# Patient Record
Sex: Female | Born: 1940 | Race: White | Hispanic: No | Smoking: Former smoker
Health system: Southern US, Community
[De-identification: ages and names within clinical notes are randomized; demographics above are authoritative.]

## PROBLEM LIST (undated history)

## (undated) DIAGNOSIS — J45909 Unspecified asthma, uncomplicated: Secondary | ICD-10-CM

## (undated) DIAGNOSIS — I219 Acute myocardial infarction, unspecified: Secondary | ICD-10-CM

## (undated) DIAGNOSIS — K259 Gastric ulcer, unspecified as acute or chronic, without hemorrhage or perforation: Secondary | ICD-10-CM

## (undated) DIAGNOSIS — J449 Chronic obstructive pulmonary disease, unspecified: Secondary | ICD-10-CM

## (undated) DIAGNOSIS — F329 Major depressive disorder, single episode, unspecified: Secondary | ICD-10-CM

## (undated) DIAGNOSIS — I10 Essential (primary) hypertension: Secondary | ICD-10-CM

## (undated) DIAGNOSIS — Z86718 Personal history of other venous thrombosis and embolism: Secondary | ICD-10-CM

## (undated) DIAGNOSIS — Z9889 Other specified postprocedural states: Secondary | ICD-10-CM

## (undated) DIAGNOSIS — F419 Anxiety disorder, unspecified: Secondary | ICD-10-CM

## (undated) DIAGNOSIS — F32A Depression, unspecified: Secondary | ICD-10-CM

## (undated) DIAGNOSIS — I639 Cerebral infarction, unspecified: Secondary | ICD-10-CM

## (undated) DIAGNOSIS — K219 Gastro-esophageal reflux disease without esophagitis: Secondary | ICD-10-CM

## (undated) DIAGNOSIS — R011 Cardiac murmur, unspecified: Secondary | ICD-10-CM

## (undated) DIAGNOSIS — R21 Rash and other nonspecific skin eruption: Secondary | ICD-10-CM

## (undated) DIAGNOSIS — N289 Disorder of kidney and ureter, unspecified: Secondary | ICD-10-CM

## (undated) DIAGNOSIS — R0602 Shortness of breath: Secondary | ICD-10-CM

## (undated) DIAGNOSIS — Z8619 Personal history of other infectious and parasitic diseases: Secondary | ICD-10-CM

## (undated) DIAGNOSIS — R112 Nausea with vomiting, unspecified: Secondary | ICD-10-CM

## (undated) DIAGNOSIS — M109 Gout, unspecified: Secondary | ICD-10-CM

## (undated) DIAGNOSIS — R233 Spontaneous ecchymoses: Secondary | ICD-10-CM

## (undated) DIAGNOSIS — R238 Other skin changes: Secondary | ICD-10-CM

## (undated) DIAGNOSIS — E7800 Pure hypercholesterolemia, unspecified: Secondary | ICD-10-CM

## (undated) DIAGNOSIS — R1310 Dysphagia, unspecified: Secondary | ICD-10-CM

## (undated) HISTORY — PX: COLONOSCOPY W/ POLYPECTOMY: SHX1380

## (undated) HISTORY — PX: ESOPHAGUS SURGERY: SHX626

## (undated) HISTORY — PX: CATARACT EXTRACTION, BILATERAL: SHX1313

## (undated) HISTORY — PX: UPPER GI ENDOSCOPY: SHX6162

## (undated) HISTORY — PX: OTHER SURGICAL HISTORY: SHX169

## (undated) HISTORY — PX: APPENDECTOMY: SHX54

## (undated) HISTORY — PX: BREAST SURGERY: SHX581

## (undated) HISTORY — PX: ABDOMINAL HYSTERECTOMY: SHX81

---

## 2013-03-22 ENCOUNTER — Inpatient Hospital Stay (HOSPITAL_COMMUNITY)
Admission: EM | Admit: 2013-03-22 | Discharge: 2013-03-25 | DRG: 191 | Disposition: A | Discharge status: Home / Self Care | Payer: Medicare Other | Attending: Internal Medicine | Admitting: Internal Medicine

## 2013-03-22 ENCOUNTER — Other Ambulatory Visit: Payer: Self-pay

## 2013-03-22 ENCOUNTER — Emergency Department (HOSPITAL_COMMUNITY): Payer: Medicare Other

## 2013-03-22 ENCOUNTER — Encounter (HOSPITAL_COMMUNITY): Payer: Self-pay | Admitting: Emergency Medicine

## 2013-03-22 DIAGNOSIS — J441 Chronic obstructive pulmonary disease with (acute) exacerbation: Principal | ICD-10-CM | POA: Diagnosis present

## 2013-03-22 DIAGNOSIS — Z86711 Personal history of pulmonary embolism: Secondary | ICD-10-CM

## 2013-03-22 DIAGNOSIS — Z7901 Long term (current) use of anticoagulants: Secondary | ICD-10-CM

## 2013-03-22 DIAGNOSIS — N189 Chronic kidney disease, unspecified: Secondary | ICD-10-CM | POA: Diagnosis present

## 2013-03-22 DIAGNOSIS — I252 Old myocardial infarction: Secondary | ICD-10-CM

## 2013-03-22 DIAGNOSIS — I129 Hypertensive chronic kidney disease with stage 1 through stage 4 chronic kidney disease, or unspecified chronic kidney disease: Secondary | ICD-10-CM | POA: Diagnosis present

## 2013-03-22 DIAGNOSIS — Z86718 Personal history of other venous thrombosis and embolism: Secondary | ICD-10-CM

## 2013-03-22 DIAGNOSIS — N19 Unspecified kidney failure: Secondary | ICD-10-CM | POA: Diagnosis present

## 2013-03-22 DIAGNOSIS — Z87891 Personal history of nicotine dependence: Secondary | ICD-10-CM

## 2013-03-22 DIAGNOSIS — R0902 Hypoxemia: Secondary | ICD-10-CM

## 2013-03-22 DIAGNOSIS — Z79899 Other long term (current) drug therapy: Secondary | ICD-10-CM

## 2013-03-22 DIAGNOSIS — I2699 Other pulmonary embolism without acute cor pulmonale: Secondary | ICD-10-CM

## 2013-03-22 DIAGNOSIS — Z8673 Personal history of transient ischemic attack (TIA), and cerebral infarction without residual deficits: Secondary | ICD-10-CM

## 2013-03-22 DIAGNOSIS — E7800 Pure hypercholesterolemia, unspecified: Secondary | ICD-10-CM | POA: Diagnosis present

## 2013-03-22 DIAGNOSIS — N179 Acute kidney failure, unspecified: Secondary | ICD-10-CM

## 2013-03-22 HISTORY — DX: Essential (primary) hypertension: I10

## 2013-03-22 HISTORY — DX: Personal history of other venous thrombosis and embolism: Z86.718

## 2013-03-22 HISTORY — DX: Acute myocardial infarction, unspecified: I21.9

## 2013-03-22 HISTORY — DX: Cerebral infarction, unspecified: I63.9

## 2013-03-22 HISTORY — DX: Pure hypercholesterolemia, unspecified: E78.00

## 2013-03-22 HISTORY — DX: Disorder of kidney and ureter, unspecified: N28.9

## 2013-03-22 LAB — CBC WITH DIFFERENTIAL/PLATELET
Basophils Absolute: 0 10*3/uL (ref 0.0–0.1)
Basophils Relative: 0 % (ref 0–1)
HCT: 36.4 % (ref 36.0–46.0)
MCHC: 34.9 g/dL (ref 30.0–36.0)
Monocytes Absolute: 0.3 10*3/uL (ref 0.1–1.0)
Neutro Abs: 6.3 10*3/uL (ref 1.7–7.7)
Platelets: 236 10*3/uL (ref 150–400)
RDW: 14.4 % (ref 11.5–15.5)

## 2013-03-22 LAB — PROTIME-INR: INR: 1.54 — ABNORMAL HIGH (ref 0.00–1.49)

## 2013-03-22 LAB — POCT I-STAT TROPONIN I: Troponin i, poc: 0 ng/mL (ref 0.00–0.08)

## 2013-03-22 LAB — PRO B NATRIURETIC PEPTIDE: Pro B Natriuretic peptide (BNP): 481.7 pg/mL — ABNORMAL HIGH (ref 0–125)

## 2013-03-22 LAB — COMPREHENSIVE METABOLIC PANEL
AST: 29 U/L (ref 0–37)
Albumin: 3.8 g/dL (ref 3.5–5.2)
Calcium: 9.6 mg/dL (ref 8.4–10.5)
Chloride: 105 mEq/L (ref 96–112)
Creatinine, Ser: 1.46 mg/dL — ABNORMAL HIGH (ref 0.50–1.10)
Sodium: 143 mEq/L (ref 135–145)
Total Bilirubin: 0.4 mg/dL (ref 0.3–1.2)

## 2013-03-22 LAB — CG4 I-STAT (LACTIC ACID): Lactic Acid, Venous: 1.57 mmol/L (ref 0.5–2.2)

## 2013-03-22 LAB — PHOSPHORUS: Phosphorus: 2.6 mg/dL (ref 2.3–4.6)

## 2013-03-22 MED ORDER — LORAZEPAM 1 MG PO TABS: 1.0000 mg | ORAL_TABLET | Freq: Once | ORAL | Status: DC

## 2013-03-22 MED ORDER — TRAMADOL HCL 50 MG PO TABS
50.0000 mg | ORAL_TABLET | Freq: Four times a day (QID) | ORAL | Status: DC | PRN
  Filled 2013-03-22: qty 1

## 2013-03-22 MED ORDER — SODIUM CHLORIDE 0.9 % IJ SOLN: 3.0000 mL | INTRAMUSCULAR | Status: DC | PRN

## 2013-03-22 MED ORDER — ALBUTEROL SULFATE (5 MG/ML) 0.5% IN NEBU
10.0000 mg | INHALATION_SOLUTION | Freq: Once | RESPIRATORY_TRACT | Status: AC
  Administered 2013-03-22: 10 mg via RESPIRATORY_TRACT

## 2013-03-22 MED ORDER — CLONIDINE HCL 0.3 MG PO TABS
0.3000 mg | ORAL_TABLET | Freq: Every evening | ORAL | Status: DC
  Administered 2013-03-22 – 2013-03-24 (×3): 0.3 mg via ORAL
  Filled 2013-03-22 (×4): qty 1

## 2013-03-22 MED ORDER — CHOLECALCIFEROL 10 MCG (400 UNIT) PO TABS
800.0000 [IU] | ORAL_TABLET | Freq: Two times a day (BID) | ORAL | Status: DC
  Administered 2013-03-22 – 2013-03-25 (×6): 800 [IU] via ORAL
  Filled 2013-03-22 (×8): qty 2

## 2013-03-22 MED ORDER — SODIUM CHLORIDE 0.9 % IV SOLN: 250.0000 mL | INTRAVENOUS | Status: DC | PRN

## 2013-03-22 MED ORDER — SODIUM CHLORIDE 0.9 % IJ SOLN
3.0000 mL | Freq: Two times a day (BID) | INTRAMUSCULAR | Status: DC
  Administered 2013-03-22 – 2013-03-25 (×6): 3 mL via INTRAVENOUS

## 2013-03-22 MED ORDER — METHYLPREDNISOLONE SODIUM SUCC 40 MG IJ SOLR
40.0000 mg | Freq: Three times a day (TID) | INTRAMUSCULAR | Status: DC
  Administered 2013-03-23 – 2013-03-24 (×4): 40 mg via INTRAVENOUS
  Filled 2013-03-22 (×8): qty 1

## 2013-03-22 MED ORDER — ALBUTEROL (5 MG/ML) CONTINUOUS INHALATION SOLN
INHALATION_SOLUTION | RESPIRATORY_TRACT | Status: AC
  Filled 2013-03-22: qty 20

## 2013-03-22 MED ORDER — IPRATROPIUM BROMIDE 0.02 % IN SOLN
500.0000 ug | Freq: Four times a day (QID) | RESPIRATORY_TRACT | Status: DC | PRN
  Administered 2013-03-23: 500 ug via RESPIRATORY_TRACT

## 2013-03-22 MED ORDER — NEBIVOLOL HCL 20 MG PO TABS: 20.0000 mg | ORAL_TABLET | Freq: Every day | ORAL | Status: DC

## 2013-03-22 MED ORDER — MOMETASONE FURO-FORMOTEROL FUM 100-5 MCG/ACT IN AERO
2.0000 | INHALATION_SPRAY | Freq: Two times a day (BID) | RESPIRATORY_TRACT | Status: DC
  Administered 2013-03-22 – 2013-03-25 (×5): 2 via RESPIRATORY_TRACT
  Filled 2013-03-22: qty 8.8

## 2013-03-22 MED ORDER — NEBIVOLOL HCL 10 MG PO TABS
20.0000 mg | ORAL_TABLET | Freq: Every day | ORAL | Status: DC
  Administered 2013-03-22 – 2013-03-25 (×4): 20 mg via ORAL
  Filled 2013-03-22 (×4): qty 2

## 2013-03-22 MED ORDER — AMLODIPINE BESYLATE 10 MG PO TABS
10.0000 mg | ORAL_TABLET | Freq: Every day | ORAL | Status: DC
  Administered 2013-03-23 – 2013-03-25 (×3): 10 mg via ORAL
  Filled 2013-03-22 (×3): qty 1

## 2013-03-22 MED ORDER — IPRATROPIUM BROMIDE 0.02 % IN SOLN
0.5000 mg | Freq: Once | RESPIRATORY_TRACT | Status: AC
  Administered 2013-03-22: 0.5 mg via RESPIRATORY_TRACT
  Filled 2013-03-22: qty 2.5

## 2013-03-22 MED ORDER — ALBUTEROL SULFATE (5 MG/ML) 0.5% IN NEBU
5.0000 mg | INHALATION_SOLUTION | Freq: Once | RESPIRATORY_TRACT | Status: AC
  Administered 2013-03-22: 5 mg via RESPIRATORY_TRACT
  Filled 2013-03-22: qty 1

## 2013-03-22 MED ORDER — ACETAMINOPHEN 650 MG RE SUPP: 650.0000 mg | Freq: Four times a day (QID) | RECTAL | Status: DC | PRN

## 2013-03-22 MED ORDER — WARFARIN SODIUM 4 MG PO TABS
4.0000 mg | ORAL_TABLET | Freq: Once | ORAL | Status: AC
  Administered 2013-03-22: 4 mg via ORAL
  Filled 2013-03-22 (×2): qty 1

## 2013-03-22 MED ORDER — OMEGA-3-ACID ETHYL ESTERS 1 G PO CAPS
1.0000 g | ORAL_CAPSULE | Freq: Two times a day (BID) | ORAL | Status: DC
  Administered 2013-03-22 – 2013-03-25 (×6): 1 g via ORAL
  Filled 2013-03-22 (×7): qty 1

## 2013-03-22 MED ORDER — SODIUM CHLORIDE 0.9 % IV BOLUS (SEPSIS)
1000.0000 mL | Freq: Once | INTRAVENOUS | Status: AC
  Administered 2013-03-22: 1000 mL via INTRAVENOUS

## 2013-03-22 MED ORDER — WARFARIN - PHARMACIST DOSING INPATIENT: Freq: Every day | Status: DC

## 2013-03-22 MED ORDER — NITROGLYCERIN 0.4 MG SL SUBL: 0.4000 mg | SUBLINGUAL_TABLET | SUBLINGUAL | Status: DC | PRN

## 2013-03-22 MED ORDER — ACETAMINOPHEN 325 MG PO TABS: 650.0000 mg | ORAL_TABLET | Freq: Four times a day (QID) | ORAL | Status: DC | PRN

## 2013-03-22 MED ORDER — LEVALBUTEROL HCL 0.63 MG/3ML IN NEBU
0.6300 mg | INHALATION_SOLUTION | Freq: Four times a day (QID) | RESPIRATORY_TRACT | Status: DC | PRN
  Administered 2013-03-23 – 2013-03-24 (×6): 0.63 mg via RESPIRATORY_TRACT
  Filled 2013-03-22 (×7): qty 3

## 2013-03-22 MED ORDER — LORAZEPAM 2 MG/ML IJ SOLN
1.0000 mg | Freq: Once | INTRAMUSCULAR | Status: AC
  Administered 2013-03-22: 1 mg via INTRAVENOUS
  Filled 2013-03-22: qty 1

## 2013-03-22 MED ORDER — SODIUM CHLORIDE 0.9 % IJ SOLN
3.0000 mL | Freq: Two times a day (BID) | INTRAMUSCULAR | Status: DC
  Administered 2013-03-24: 3 mL via INTRAVENOUS

## 2013-03-22 MED ORDER — LEVOFLOXACIN IN D5W 750 MG/150ML IV SOLN
750.0000 mg | INTRAVENOUS | Status: DC
  Administered 2013-03-22 – 2013-03-24 (×2): 750 mg via INTRAVENOUS
  Filled 2013-03-22 (×2): qty 150

## 2013-03-22 MED ORDER — PANTOPRAZOLE SODIUM 40 MG PO TBEC
40.0000 mg | DELAYED_RELEASE_TABLET | Freq: Every day | ORAL | Status: DC
  Administered 2013-03-22 – 2013-03-25 (×4): 40 mg via ORAL
  Filled 2013-03-22 (×4): qty 1

## 2013-03-22 MED ORDER — ISOSORBIDE MONONITRATE ER 60 MG PO TB24
60.0000 mg | ORAL_TABLET | Freq: Every day | ORAL | Status: DC
  Administered 2013-03-23 – 2013-03-25 (×3): 60 mg via ORAL
  Filled 2013-03-22 (×3): qty 1

## 2013-03-22 MED ORDER — PREDNISONE 20 MG PO TABS
60.0000 mg | ORAL_TABLET | Freq: Once | ORAL | Status: AC
  Administered 2013-03-22: 60 mg via ORAL
  Filled 2013-03-22: qty 3

## 2013-03-22 MED ORDER — ATORVASTATIN CALCIUM 40 MG PO TABS
40.0000 mg | ORAL_TABLET | Freq: Every day | ORAL | Status: DC
  Administered 2013-03-22 – 2013-03-24 (×3): 40 mg via ORAL
  Filled 2013-03-22 (×4): qty 1

## 2013-03-22 NOTE — Progress Notes (Signed)
ANTIBIOTIC CONSULT NOTE - INITIAL  Pharmacy Consult for Warfarin and Levofloxacin Indication: rule out pneumonia  No Known Allergies  Patient Measurements: Height: 5\' 4"  (162.6 cm) Weight: 207 lb (93.895 kg) IBW/kg (Calculated) : 54.7  Vital Signs: Temp: 98.1 F (36.7 C) (04/19 1330) Temp src: Oral (04/19 1330) BP: 187/75 mmHg (04/19 1600) Pulse Rate: 122 (04/19 1724) Intake/Output from previous day:   Intake/Output from this shift:    Labs:  Recent Labs  03/22/13 1347  WBC 8.6  HGB 12.7  PLT 236  CREATININE 1.46*   Estimated Creatinine Clearance: 39.8 ml/min (by C-G formula based on Cr of 1.46). No results found for this basename: VANCOTROUGH, VANCOPEAK, VANCORANDOM, GENTTROUGH, GENTPEAK, GENTRANDOM, TOBRATROUGH, TOBRAPEAK, TOBRARND, AMIKACINPEAK, AMIKACINTROU, AMIKACIN,  in the last 72 hours   Microbiology: No results found for this or any previous visit (from the past 720 hour(s)).  Medical History: Past Medical History  Diagnosis Date  . Stroke   . Hypertension   . MI (myocardial infarction)   . History of blood clots   . Renal disorder   . High cholesterol     Medications:   (Not in a hospital admission)  Assessment: Admit Complaint: 72 y.o.  female  admitted 03/22/2013 with SOB.  Pharmacy consulted to dose Levofloxacin for COPD exacerbation and warfarin for history of PE/DVT.Marland Kitchen  PMH: Stroke, HTN, CAD, COPD, History of PE,  PTA Warfarin 2.5 mg daily  Assessment: Anticoagulation: History of PE: warfarin, INR <goal  Infectious Disease: COPD exacerbation Antibiotics: 4/19 Levofloxacin  PTA Medication Issues: Home Meds Not Ordered:   Best Practices: VTE Prophylaxis:  warfarin   Goal of Therapy:  Renal adjustment of antibiotics. INR 2-3  Plan:  1. Levofloxacin 750 mg IV q48h 2. Warfarin 4 mg PO x 1 today 3. Daily INR 4. Follow up SCr, UOP, cultures, clinical course and adjust as clinically indicated.   Thank you for allowing pharmacy to  be a part of this patients care team.  Lovenia Kim Pharm.D., BCPS Clinical Pharmacist 03/22/2013 5:28 PM Pager: [phone] Phone: [phone]

## 2013-03-22 NOTE — ED Provider Notes (Signed)
History     CSN: [id number]  Arrival date & time 03/22/13  1309   First MD Initiated Contact with Patient 03/22/13 1330      Chief Complaint  Patient presents with  . Shortness of Breath    (Consider location/radiation/quality/duration/timing/severity/associated sxs/prior treatment) HPI 72 year old female is gradual onset gradually worsening few days of cough and shortness of breath and wheezing with no fever no confusion no chest pain no bowel pain no vomiting or diarrhea. She is generally weak and today went to an urgent care where she was found to be hypoxic with room-air pulse oximetry 88% and sent to the ED for evaluation after she received albuterol and Atrovent nebulizer at the urgent care. She is moderately short of breath at rest today. Past Medical History  Diagnosis Date  . Stroke   . Hypertension   . MI (myocardial infarction)   . History of blood clots   . Renal disorder   . High cholesterol    DVT PE, renal insufficiency, chronic edema, COPD  Past Surgical History  Procedure Laterality Date  . Abdominal hysterectomy      History reviewed. No pertinent family history.  History  Substance Use Topics  . Smoking status: Former Smoker    Quit date: 02/01/2013  . Smokeless tobacco: Not on file  . Alcohol Use: No    OB History   Grav Para Term Preterm Abortions TAB SAB Ect Mult Living                  Review of Systems 10 Systems reviewed and are negative for acute change except as noted in the HPI. Allergies  Review of patient's allergies indicates no known allergies.  Home Medications   Current Outpatient Rx  Name  Route  Sig  Dispense  Refill  . albuterol (PROVENTIL) (2.5 MG/3ML) 0.083% nebulizer solution   Nebulization   Take 2.5 mg by nebulization every 4 (four) hours as needed for wheezing or shortness of breath.         Marland Kitchen amLODipine (NORVASC) 10 MG tablet   Oral   Take 10 mg by mouth daily.         Marland Kitchen atorvastatin (LIPITOR) 40 MG  tablet   Oral   Take 40 mg by mouth at bedtime.         . Calcium-Magnesium-Vitamin D (CALCIUM 500 PO)   Oral   Take 500 mg by mouth daily.         . cholecalciferol (VITAMIN D) 400 UNITS TABS   Oral   Take 800 Units by mouth 2 (two) times daily.         . cloNIDine (CATAPRES) 0.3 MG tablet   Oral   Take 0.3 mg by mouth every evening.          . Fluticasone-Salmeterol (ADVAIR) 250-50 MCG/DOSE AEPB   Inhalation   Inhale 1 puff into the lungs every 12 (twelve) hours.         . furosemide (LASIX) 20 MG tablet   Oral   Take 20 mg by mouth daily.         Marland Kitchen guaiFENesin (MUCINEX) 600 MG 12 hr tablet   Oral   Take 1,200 mg by mouth every 12 (twelve) hours as needed for congestion.         Marland Kitchen ipratropium (ATROVENT) 0.02 % nebulizer solution   Nebulization   Take 500 mcg by nebulization every 6 (six) hours as needed for wheezing ((with Xopenex)).         Marland Kitchen  isosorbide mononitrate (IMDUR) 60 MG 24 hr tablet   Oral   Take 60 mg by mouth daily.         Marland Kitchen lisinopril (PRINIVIL,ZESTRIL) 20 MG tablet   Oral   Take 20 mg by mouth daily.         . Nebivolol HCl (BYSTOLIC) 20 MG TABS   Oral   Take 20 mg by mouth daily.         . nitroGLYCERIN (NITROSTAT) 0.4 MG SL tablet   Sublingual   Place 0.4 mg under the tongue every 5 (five) minutes as needed for chest pain.         . Omega-3 Fatty Acids (FISH OIL) 1200 MG CAPS   Oral   Take 1,200 mg by mouth daily.         Marland Kitchen omeprazole (PRILOSEC) 20 MG capsule   Oral   Take 20 mg by mouth daily.         . traMADol (ULTRAM) 50 MG tablet   Oral   Take 50 mg by mouth every 6 (six) hours as needed for pain.         Marland Kitchen tretinoin (RETIN-A) 0.1 % cream   Topical   Apply 1 application topically as directed.         . vitamin E 1000 UNIT capsule   Oral   Take 1,000 Units by mouth daily.         Marland Kitchen HYDROcodone-homatropine (HYCODAN) 5-1.5 MG/5ML syrup   Oral   Take 5 mLs by mouth every 6 (six) hours as needed  for cough.   120 mL   0   . levofloxacin (LEVAQUIN) 750 MG tablet   Oral   Take 1 tablet (750 mg total) by mouth every other day.   2 tablet   0   . predniSONE (DELTASONE) 20 MG tablet      40 mg x 3 days, 30 mg x 3 days, 20 mg x 3 days, 10 mg x 3 days then d/c   30 tablet   0   . warfarin (COUMADIN) 4 MG tablet   Oral   Take 1 tablet (4 mg total) by mouth daily.   10 tablet   0     BP 158/74  Pulse 80  Temp(Src) 97.7 F (36.5 C) (Oral)  Resp 18  Ht 5\' 4"  (1.626 m)  Wt 212 lb 3.2 oz (96.253 kg)  BMI 36.41 kg/m2  SpO2 95%  Physical Exam  Nursing note and vitals reviewed. Constitutional:  Awake, alert, nontoxic appearance.  HENT:  Head: Atraumatic.  Eyes: Right eye exhibits no discharge. Left eye exhibits no discharge.  Neck: Neck supple.  Cardiovascular: Normal rate and regular rhythm.   No murmur heard. Pulmonary/Chest: She is in respiratory distress. She has wheezes. She has no rales. She exhibits no tenderness.  Moderate respiratory distress, the patient is able to speak short sentences at rest with moderate distress, room-air pulse oximetry upon arrival was 88% hypoxic, pulse oximetry on nasal cannula oxygen is in the high 90s, she has diffuse history expiratory wheezes with mild retractions mild accessory muscle usage no crackles no rhonchi no stridor no drooling no immediate airway compromise  Abdominal: Soft. There is no tenderness. There is no rebound.  Musculoskeletal: She exhibits edema. She exhibits no tenderness.  Baseline ROM, no obvious new focal weakness. Trace edema to her lower legs which is baseline for the patient.  Neurological: She is alert.  Mental status and motor strength appears  baseline for patient and situation.  Skin: No rash noted.  Psychiatric: She has a normal mood and affect.    ED Course  Procedures (including critical care time) ECG: Sinus rhythm, ventricular rate 97, normal axis, normal intervals, nonspecific ST T changes  diffusely with no comparison ECG available   Patient / Family / Caregiver understand and agree with initial ED impression and plan with expectations set for ED visit.  Transiently improved but worse again after x-ray with diffuse wheezing and dyspnea at rest just like upon arrival so repeat continuous neb and paged Unassigned med for admit. 1540  Pt had initially hoped for discharge but with worsening wheezing again feel admit warranted and Pt agrees; d/w [facility] who will evaluate Pt in the ED for admit. 1550  Patient / Family / Caregiver informed of clinical course, understand medical decision-making process, and agree with plan.  Labs Reviewed  PRO B NATRIURETIC PEPTIDE - Abnormal; Notable for the following:    Pro B Natriuretic peptide (BNP) 481.7 (*)    All other components within normal limits  CBC WITH DIFFERENTIAL - Abnormal; Notable for the following:    Eosinophils Relative 8 (*)    All other components within normal limits  COMPREHENSIVE METABOLIC PANEL - Abnormal; Notable for the following:    Potassium 3.4 (*)    Glucose, Bld 135 (*)    Creatinine, Ser 1.46 (*)    GFR calc non Af Amer 35 (*)    GFR calc Af Amer 41 (*)    All other components within normal limits  PROTIME-INR - Abnormal; Notable for the following:    Prothrombin Time 18.0 (*)    INR 1.54 (*)    All other components within normal limits  PROTIME-INR - Abnormal; Notable for the following:    Prothrombin Time 17.6 (*)    All other components within normal limits  BASIC METABOLIC PANEL - Abnormal; Notable for the following:    Glucose, Bld 107 (*)    Creatinine, Ser 1.71 (*)    GFR calc non Af Amer 29 (*)    GFR calc Af Amer 34 (*)    All other components within normal limits  CBC - Abnormal; Notable for the following:    WBC 11.3 (*)    RBC 3.61 (*)    Hemoglobin 11.5 (*)    HCT 33.3 (*)    All other components within normal limits  PROTIME-INR - Abnormal; Notable for the following:     Prothrombin Time 17.9 (*)    INR 1.52 (*)    All other components within normal limits  CBC - Abnormal; Notable for the following:    WBC 17.5 (*)    RBC 3.54 (*)    Hemoglobin 11.3 (*)    HCT 32.9 (*)    All other components within normal limits  BASIC METABOLIC PANEL - Abnormal; Notable for the following:    Glucose, Bld 136 (*)    BUN 33 (*)    Creatinine, Ser 1.56 (*)    GFR calc non Af Amer 33 (*)    GFR calc Af Amer 38 (*)    All other components within normal limits  PROTIME-INR - Abnormal; Notable for the following:    Prothrombin Time 20.9 (*)    INR 1.88 (*)    All other components within normal limits  CULTURE, EXPECTORATED SPUTUM-ASSESSMENT  TSH  MAGNESIUM  PHOSPHORUS  LEGIONELLA ANTIGEN, URINE  STREP PNEUMONIAE URINARY ANTIGEN  CG4 I-STAT (LACTIC ACID)  POCT I-STAT TROPONIN I   No results found.   1. COPD with acute exacerbation   2. Hypoxia   3. History of DVT (deep vein thrombosis)   4. PE (pulmonary embolism)   5. Renal failure   6. AKI (acute kidney injury)       MDM  The patient appears reasonably stabilized for admission considering the current resources, flow, and capabilities available in the ED at this time, and I doubt any other [facility] requiring further screening and/or treatment in the ED prior to admission.        Hurman Horn, MD 03/27/13 [phone]

## 2013-03-22 NOTE — Progress Notes (Signed)
Patient transferred from ED via stretcher to room 4710. Received report from ED nurse. Patient mildly anxious but able to focus and listen when orienting patient to call bell system, TV and education about being on heart failure floor. Patietn understood. Patient has been receiving breathing treatments around the clock and therefore patient stated she felt a liittle "shaky". Explained to patient that it is mainly from the side effects of the breathing treatments at a pretty frequent dosing and she understood. Encouraged patient to try to do pursed lip breathing to help try to relax and help her to be calm. During giving report to the next nurse was notified that patient was resting. Nurse signing off and night nurse will continue to monitor to end of shift.

## 2013-03-22 NOTE — H&P (Signed)
[facility] History and Physical  [name] [name] ZOX:[id number] DOB: 1941-05-30 DOA: 03/22/2013  Referring physician: Dr. Fonnie Jarvis PCP: No primary provider on file.  Specialists: none  Chief Complaint: SOB, increased cough  HPI: [name] [name] is a 72 y.o. female  With history of COPD not on home oxygen who presented to the ED after developing SOB while at a funeral.  She reports that certain perfumes trigger her COPD and she thinks she may have got in contact with a perfume while at the funeral.  Since Wednesday she reports increase in cough which is productive of clear sputum above her baseline and SOB.  Her nebulizers at home were only minimally helping and her problem got worse and as such she decided to go to the urgent care center for further evaluation.   While in the ED received solumedrol and albuterol but given patient's wheezing we were consulted for admission evaluation and recommendations for Copd exacerbation.  Review of Systems: 10 point review of system reviewed and negative unless otherwise mentioned above.  Past Medical History  Diagnosis Date  . Stroke   . Hypertension   . MI (myocardial infarction)   . History of blood clots   . Renal disorder   . High cholesterol    Past Surgical History  Procedure Laterality Date  . Abdominal hysterectomy     Social History:  reports that she quit smoking about 7 weeks ago. She does not have any smokeless tobacco history on file. She reports that she does not drink alcohol. Her drug history is not on file. Patient lives at home  Can patient participate in ADLs? yes  No Known Allergies  History reviewed. No pertinent family history.   Prior to Admission medications   Medication Sig Start Date End Date Taking? Authorizing Provider  albuterol (PROVENTIL) (2.5 MG/3ML) 0.083% nebulizer solution Take 2.5 mg by nebulization every 4 (four) hours as needed for wheezing or shortness of breath.   Yes Historical Provider, MD   amLODipine (NORVASC) 10 MG tablet Take 10 mg by mouth daily.   Yes Historical Provider, MD  atorvastatin (LIPITOR) 40 MG tablet Take 40 mg by mouth at bedtime.   Yes Historical Provider, MD  Calcium-Magnesium-Vitamin D (CALCIUM 500 PO) Take 500 mg by mouth daily.   Yes Historical Provider, MD  cholecalciferol (VITAMIN D) 400 UNITS TABS Take 800 Units by mouth 2 (two) times daily.   Yes Historical Provider, MD  cloNIDine (CATAPRES) 0.3 MG tablet Take 0.3 mg by mouth every evening.    Yes Historical Provider, MD  Fluticasone-Salmeterol (ADVAIR) 250-50 MCG/DOSE AEPB Inhale 1 puff into the lungs every 12 (twelve) hours.   Yes Historical Provider, MD  furosemide (LASIX) 20 MG tablet Take 20 mg by mouth daily.   Yes Historical Provider, MD  guaiFENesin (MUCINEX) 600 MG 12 hr tablet Take 1,200 mg by mouth every 12 (twelve) hours as needed for congestion.   Yes Historical Provider, MD  ipratropium (ATROVENT) 0.02 % nebulizer solution Take 500 mcg by nebulization every 6 (six) hours as needed for wheezing ((with Xopenex)).   Yes Historical Provider, MD  isosorbide mononitrate (IMDUR) 60 MG 24 hr tablet Take 60 mg by mouth daily.   Yes Historical Provider, MD  lisinopril (PRINIVIL,ZESTRIL) 20 MG tablet Take 20 mg by mouth daily.   Yes Historical Provider, MD  Nebivolol HCl (BYSTOLIC) 20 MG TABS Take 20 mg by mouth daily.   Yes Historical Provider, MD  nitroGLYCERIN (NITROSTAT) 0.4 MG SL tablet Place 0.4 mg  under the tongue every 5 (five) minutes as needed for chest pain.   Yes Historical Provider, MD  Omega-3 Fatty Acids (FISH OIL) 1200 MG CAPS Take 1,200 mg by mouth daily.   Yes Historical Provider, MD  omeprazole (PRILOSEC) 20 MG capsule Take 20 mg by mouth daily.   Yes Historical Provider, MD  traMADol (ULTRAM) 50 MG tablet Take 50 mg by mouth every 6 (six) hours as needed for pain.   Yes Historical Provider, MD  tretinoin (RETIN-A) 0.1 % cream Apply 1 application topically as directed.   Yes Historical  Provider, MD  vitamin E 1000 UNIT capsule Take 1,000 Units by mouth daily.   Yes Historical Provider, MD  warfarin (COUMADIN) 2.5 MG tablet Take 2.5 mg by mouth daily.    Yes Historical Provider, MD   Physical Exam: Filed Vitals:   03/22/13 1445 03/22/13 1530 03/22/13 1545 03/22/13 1600  BP: 185/87 191/81 189/86 187/75  Pulse: 104 117 113 117  Temp:      TempSrc:      Resp: 17 24 19 18   Height:      Weight:      SpO2: 100% 97% 97% 100%     General:  Pt in NAD, Alert and Awake  Eyes: EOMI, non icteric  ENT: normal exterior appearance, MMM  Neck: supple no goiter  Cardiovascular: S1 and S2 WNL tachycardic, No MRG  Respiratory: CTA BL, no wheezes  Abdomen: soft, NT, ND  Skin: warm and dry  Musculoskeletal: no cyanosis or clubbing, no calf pain  Psychiatric:  Mood and affect appropriate  Neurologic: moves all extremities, answers questions appropriately.  Labs on Admission:  Basic Metabolic Panel:  Recent Labs Lab 03/22/13 1347  NA 143  K 3.4*  CL 105  CO2 25  GLUCOSE 135*  BUN 15  CREATININE 1.46*  CALCIUM 9.6   Liver Function Tests:  Recent Labs Lab 03/22/13 1347  AST 29  ALT 20  ALKPHOS 115  BILITOT 0.4  PROT 6.7  ALBUMIN 3.8   No results found for this basename: LIPASE, AMYLASE,  in the last 168 hours No results found for this basename: AMMONIA,  in the last 168 hours CBC:  Recent Labs Lab 03/22/13 1347  WBC 8.6  NEUTROABS 6.3  HGB 12.7  HCT 36.4  MCV 91.7  PLT 236   Cardiac Enzymes: No results found for this basename: CKTOTAL, CKMB, CKMBINDEX, TROPONINI,  in the last 168 hours  BNP (last 3 results)  Recent Labs  03/22/13 1346  PROBNP 481.7*   CBG: No results found for this basename: GLUCAP,  in the last 168 hours  Radiological Exams on Admission: Dg Chest 2 View (if Patient Has Fever And/or Copd)  03/22/2013  *RADIOLOGY REPORT*  Clinical Data: Shortness of breath  CHEST - 2 VIEW  Comparison: None.  Findings: Lungs are  clear. No pleural effusion or pneumothorax.  The heart is normal in size.  Mild degenerative changes of the visualized thoracolumbar spine.  IVC filter.  IMPRESSION: No evidence of acute cardiopulmonary disease.   Original Report Authenticated By: Charline Bills, M.D.     EKG: Independently reviewed.   Assessment/Plan Active Problems:   COPD with acute exacerbation   PE (pulmonary embolism)   Renal failure   History of DVT (deep vein thrombosis)   1. COPD with acute exacerbation -  Place on Levaquin, nebulizers, Dulera - sputum culture - most likely related to recent perfume exposure at funeral - supplemental oxygen prn  - Solumedrol  40 mg IV tid - check TSH, magnesium, phosphorus level.  2. 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 Renal failure - Will hold nephrotoxic agents lisinopril and lasix - recheck S creatinine next am   Code Status: full Family Communication: Discussed with patient and daughter at bedside.  Disposition Plan: With improvement in respiratory condition. Likely next 1-3 days.  Time spent: > 60 minutes  Penny Pia [facility] Pager [phone]  If 7PM-7AM, please contact night-coverage [email or web address] Password [facility] 03/22/2013, 4:51 PM

## 2013-03-22 NOTE — ED Notes (Addendum)
Pt from urgent care, c/o sob. Pt received duo neb,decadron, and albuterol treatment PTA. Pt states breathing is better, audible inspiratory/expritory wheezing. Pt states sob began Wed. At funeral of brother.

## 2013-03-23 DIAGNOSIS — N179 Acute kidney failure, unspecified: Secondary | ICD-10-CM

## 2013-03-23 LAB — CBC
MCH: 31.9 pg (ref 26.0–34.0)
MCV: 92.2 fL (ref 78.0–100.0)
Platelets: 215 10*3/uL (ref 150–400)
RDW: 14.6 % (ref 11.5–15.5)

## 2013-03-23 LAB — BASIC METABOLIC PANEL
BUN: 23 mg/dL (ref 6–23)
CO2: 23 mEq/L (ref 19–32)
Calcium: 9.4 mg/dL (ref 8.4–10.5)
Creatinine, Ser: 1.71 mg/dL — ABNORMAL HIGH (ref 0.50–1.10)
Glucose, Bld: 107 mg/dL — ABNORMAL HIGH (ref 70–99)

## 2013-03-23 LAB — LEGIONELLA ANTIGEN, URINE: Legionella Antigen, Urine: NEGATIVE

## 2013-03-23 MED ORDER — METHYLPREDNISOLONE SODIUM SUCC 40 MG IJ SOLR
40.0000 mg | Freq: Once | INTRAMUSCULAR | Status: AC
  Administered 2013-03-23: 40 mg via INTRAVENOUS

## 2013-03-23 MED ORDER — GUAIFENESIN-DM 100-10 MG/5ML PO SYRP
5.0000 mL | ORAL_SOLUTION | ORAL | Status: DC | PRN
  Administered 2013-03-23 (×2): 5 mL via ORAL
  Filled 2013-03-23 (×2): qty 5

## 2013-03-23 MED ORDER — SODIUM CHLORIDE 0.9 % IV SOLN
INTRAVENOUS | Status: DC
  Administered 2013-03-23 (×2): via INTRAVENOUS

## 2013-03-23 MED ORDER — WARFARIN SODIUM 5 MG PO TABS
5.0000 mg | ORAL_TABLET | Freq: Once | ORAL | Status: AC
  Administered 2013-03-23: 5 mg via ORAL
  Filled 2013-03-23 (×2): qty 1

## 2013-03-23 NOTE — Progress Notes (Signed)
ANTICOAGULATION CONSULT NOTE - Follow Up Consult  Pharmacy Consult for warfarin Indication: history of DVT/PE  No Known Allergies  Patient Measurements: Height: 5\' 4"  (162.6 cm) Weight: 161 lb 7.5 oz (93.2 kg) IBW/kg (Calculated) : 54.7   Vital Signs: Temp: 97.6 F (36.4 C) (04/20 0709) Temp src: Oral (04/20 0709) BP: 151/77 mmHg (04/20 0709) Pulse Rate: 98 (04/20 0709)  Labs:  Recent Labs  03/22/13 1347 03/23/13 0510  HGB 12.7 11.5*  HCT 36.4 33.3*  PLT 236 215  LABPROT 18.0* 17.6*  INR 1.54* 1.49  CREATININE 1.46* 1.71*    Estimated Creatinine Clearance: 33.9 ml/min (by C-G formula based on Cr of 1.71).   Assessment: 66 yoF admitted 4/19 with COPD exacerbation. Patient on chronic warfarin for history of DVT/PE (2.5mg  daily). INR was SUB-therapeutic on admission at 1.54. Patient received 4mg  last night and INR still below therapeutic range today at 1.49. Hgb has dropped to 11.5 (12.7 on admit), plts are stable. No bleeding noted.   Goal of Therapy:  INR 2-3 Monitor platelets by anticoagulation protocol: Yes   Plan:  - warfarin 5mg  PO x1 tonight - daily INR - CBC at least q72h - monitor for bleeding  Thank you for the consult.  Tomi Bamberger, PharmD Clinical Pharmacist Pager: [phone] Pharmacy: [phone] 03/23/2013 7:34 AM

## 2013-03-23 NOTE — Progress Notes (Signed)
The patient began to have labored breathing and loud, expiratory wheezes after having a coughing spell. Her O2 saturation remained in the upper 90s, but the patient became increasingly SOB when talking. When a prn Xopenex treatment did not alleviate symptoms, the TH on call and [facility] were notified.  Both came to assess the patient.  New orders were given to administer 40 mg of Solu-medrol IV and to contact both again if the patient experienced worsening symptoms.  The RN will carry out the orders and will continue to monitor the patient.

## 2013-03-23 NOTE — Progress Notes (Signed)
[facility] PROGRESS NOTE  [name] [name] ZOX:[id number] DOB: 02-14-1941 DOA: 03/22/2013 PCP: No primary provider on file.  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   Renal failure  - Will hold nephrotoxic agents lisinopril and lasix  -gentle IVF   Code Status: full Family Communication: patient at bedside Disposition Plan: home 2-3 days   Consultants:  none  Procedures:  none  Antibiotics:  levaquin  HPI/Subjective: C/o wheezing +cough, still not feeling well  Objective: Filed Vitals:   03/22/13 2052 03/23/13 0206 03/23/13 0709 03/23/13 0826  BP:  151/74 151/77   Pulse:  105 98   Temp:  97.6 F (36.4 C) 97.6 F (36.4 C)   TempSrc:  Oral Oral   Resp:  24 22   Height:      Weight:   93.2 kg (205 lb 7.5 oz)   SpO2: 95% 97% 98% 98%    Intake/Output Summary (Last 24 hours) at 03/23/13 0919 Last data filed at 03/23/13 0828  Gross per 24 hour  Intake    723 ml  Output      0 ml  Net    723 ml   Filed Weights   03/22/13 1322 03/22/13 1800 03/23/13 0709  Weight: 93.895 kg (207 lb) 92.534 kg (204 lb) 93.2 kg (205 lb 7.5 oz)    Exam:   General:  A+Ox3, NAD  Cardiovascular: rrr  Respiratory: b/l wheezing and upper airway sounds  Abdomen: +BS, soft, NT/ND  Musculoskeletal: moves all 4 ext   Data Reviewed: Basic Metabolic Panel:  Recent Labs Lab 03/22/13 1347 03/22/13 1714 03/23/13 0510  NA 143  --  142  K 3.4*  --  3.8  CL 105  --  106  CO2 25  --  23  GLUCOSE 135*  --  107*  BUN 15  --  23  CREATININE 1.46*  --  1.71*  CALCIUM 9.6  --  9.4  MG  --  1.9   --   PHOS  --  2.6  --    Liver Function Tests:  Recent Labs Lab 03/22/13 1347  AST 29  ALT 20  ALKPHOS 115  BILITOT 0.4  PROT 6.7  ALBUMIN 3.8   No results found for this basename: LIPASE, AMYLASE,  in the last 168 hours No results found for this basename: AMMONIA,  in the last 168 hours CBC:  Recent Labs Lab 03/22/13 1347 03/23/13 0510  WBC 8.6 11.3*  NEUTROABS 6.3  --   HGB 12.7 11.5*  HCT 36.4 33.3*  MCV 91.7 92.2  PLT 236 215   Cardiac Enzymes: No results found for this basename: CKTOTAL, CKMB, CKMBINDEX, TROPONINI,  in the last 168 hours BNP (last 3 results)  Recent Labs  03/22/13 1346  PROBNP 481.7*   CBG: No results found for this basename: GLUCAP,  in the last 168 hours  No results found for this or any previous visit (from the past 240 hour(s)).   Studies: Dg Chest 2 View (if Patient Has Fever And/or Copd)  03/22/2013  *RADIOLOGY REPORT*  Clinical Data: Shortness of breath  CHEST - 2 VIEW  Comparison: None.  Findings: Lungs are clear. No pleural effusion or pneumothorax.  The heart is normal in size.  Mild degenerative changes of the visualized thoracolumbar spine.  IVC filter.  IMPRESSION: No evidence of acute cardiopulmonary disease.   Original Report Authenticated By: Charline Bills, M.D.     Scheduled Meds: . amLODipine  10 mg Oral Daily  . atorvastatin  40 mg Oral QHS  . cholecalciferol  800 Units Oral BID  . cloNIDine  0.3 mg Oral QPM  . isosorbide mononitrate  60 mg Oral Daily  . levofloxacin (LEVAQUIN) IV  750 mg Intravenous Q48H  . methylPREDNISolone (SOLU-MEDROL) injection  40 mg Intravenous Q8H  . mometasone-formoterol  2 puff Inhalation BID  . nebivolol  20 mg Oral Daily  . omega-3 acid ethyl esters  1 g Oral BID  . pantoprazole  40 mg Oral Daily  . sodium chloride  3 mL Intravenous Q12H  . sodium chloride  3 mL Intravenous Q12H  . warfarin  5 mg Oral ONCE-1800  . Warfarin - Pharmacist Dosing Inpatient   Does not apply q1800    Continuous Infusions:   Active Problems:   COPD with acute exacerbation   PE (pulmonary embolism)   Renal failure   History of DVT (deep vein thrombosis)    Time spent: 35    [facility], JESSICA  [facility] Pager [phone]. If 7PM-7AM, please contact night-coverage at [email or web address], password [facility] 03/23/2013, 9:19 AM  LOS: 1 day

## 2013-03-23 NOTE — Progress Notes (Signed)
The patient's wheezes became slightly better throughout the night while awake.  During sleep, no wheezes could be heard.  Her O2 sats remained in the upper 90s all night.

## 2013-03-24 LAB — CBC
MCH: 31.9 pg (ref 26.0–34.0)
MCV: 92.9 fL (ref 78.0–100.0)
Platelets: 233 10*3/uL (ref 150–400)
RBC: 3.54 MIL/uL — ABNORMAL LOW (ref 3.87–5.11)
RDW: 14.9 % (ref 11.5–15.5)

## 2013-03-24 LAB — BASIC METABOLIC PANEL
CO2: 23 mEq/L (ref 19–32)
Calcium: 9.1 mg/dL (ref 8.4–10.5)
Creatinine, Ser: 1.56 mg/dL — ABNORMAL HIGH (ref 0.50–1.10)

## 2013-03-24 MED ORDER — WARFARIN SODIUM 6 MG PO TABS
6.0000 mg | ORAL_TABLET | Freq: Once | ORAL | Status: AC
  Administered 2013-03-24: 6 mg via ORAL
  Filled 2013-03-24: qty 1

## 2013-03-24 MED ORDER — HYDROCODONE-HOMATROPINE 5-1.5 MG/5ML PO SYRP
5.0000 mL | ORAL_SOLUTION | Freq: Four times a day (QID) | ORAL | Status: DC | PRN
  Administered 2013-03-24 – 2013-03-25 (×3): 5 mL via ORAL
  Filled 2013-03-24 (×3): qty 5

## 2013-03-24 MED ORDER — PREDNISONE 20 MG PO TABS
40.0000 mg | ORAL_TABLET | Freq: Every day | ORAL | Status: DC
  Administered 2013-03-24 – 2013-03-25 (×2): 40 mg via ORAL
  Filled 2013-03-24 (×4): qty 2

## 2013-03-24 NOTE — Progress Notes (Signed)
ANTICOAGULATION CONSULT NOTE - Follow Up Consult  Pharmacy Consult for Coumadin Indication: History of DVT/PE, chronic anticoagulation  Labs:  Recent Labs  03/22/13 1347 03/23/13 0510 03/24/13 0535  HGB 12.7 11.5* 11.3*  HCT 36.4 33.3* 32.9*  PLT 236 215 233  LABPROT 18.0* 17.6* 17.9*  INR 1.54* 1.49 1.52*  CREATININE 1.46* 1.71* 1.56*   Lab Results  Component Value Date   INR 1.52* 03/24/2013   INR 1.49 03/23/2013   INR 1.54* 03/22/2013    Medications:  Prescriptions prior to admission  Medication Sig Dispense Refill  . albuterol (PROVENTIL) (2.5 MG/3ML) 0.083% nebulizer solution Take 2.5 mg by nebulization every 4 (four) hours as needed for wheezing or shortness of breath.      Marland Kitchen amLODipine (NORVASC) 10 MG tablet Take 10 mg by mouth daily.      Marland Kitchen atorvastatin (LIPITOR) 40 MG tablet Take 40 mg by mouth at bedtime.      . Calcium-Magnesium-Vitamin D (CALCIUM 500 PO) Take 500 mg by mouth daily.      . cholecalciferol (VITAMIN D) 400 UNITS TABS Take 800 Units by mouth 2 (two) times daily.      . cloNIDine (CATAPRES) 0.3 MG tablet Take 0.3 mg by mouth every evening.       . Fluticasone-Salmeterol (ADVAIR) 250-50 MCG/DOSE AEPB Inhale 1 puff into the lungs every 12 (twelve) hours.      . furosemide (LASIX) 20 MG tablet Take 20 mg by mouth daily.      Marland Kitchen guaiFENesin (MUCINEX) 600 MG 12 hr tablet Take 1,200 mg by mouth every 12 (twelve) hours as needed for congestion.      Marland Kitchen ipratropium (ATROVENT) 0.02 % nebulizer solution Take 500 mcg by nebulization every 6 (six) hours as needed for wheezing ((with Xopenex)).      Marland Kitchen isosorbide mononitrate (IMDUR) 60 MG 24 hr tablet Take 60 mg by mouth daily.      Marland Kitchen lisinopril (PRINIVIL,ZESTRIL) 20 MG tablet Take 20 mg by mouth daily.      . Nebivolol HCl (BYSTOLIC) 20 MG TABS Take 20 mg by mouth daily.      . nitroGLYCERIN (NITROSTAT) 0.4 MG SL tablet Place 0.4 mg under the tongue every 5 (five) minutes as needed for chest pain.      . Omega-3 Fatty  Acids (FISH OIL) 1200 MG CAPS Take 1,200 mg by mouth daily.      Marland Kitchen omeprazole (PRILOSEC) 20 MG capsule Take 20 mg by mouth daily.      . traMADol (ULTRAM) 50 MG tablet Take 50 mg by mouth every 6 (six) hours as needed for pain.      Marland Kitchen tretinoin (RETIN-A) 0.1 % cream Apply 1 application topically as directed.      . vitamin E 1000 UNIT capsule Take 1,000 Units by mouth daily.      Marland Kitchen warfarin (COUMADIN) 2.5 MG tablet Take 2.5 mg by mouth daily.        Scheduled:  . amLODipine  10 mg Oral Daily  . atorvastatin  40 mg Oral QHS  . cholecalciferol  800 Units Oral BID  . cloNIDine  0.3 mg Oral QPM  . isosorbide mononitrate  60 mg Oral Daily  . levofloxacin (LEVAQUIN) IV  750 mg Intravenous Q48H  . methylPREDNISolone (SOLU-MEDROL) injection  40 mg Intravenous Q8H  . mometasone-formoterol  2 puff Inhalation BID  . nebivolol  20 mg Oral Daily  . omega-3 acid ethyl esters  1 g Oral BID  . pantoprazole  40 mg Oral Daily  . sodium chloride  3 mL Intravenous Q12H  . sodium chloride  3 mL Intravenous Q12H  . [COMPLETED] warfarin  5 mg Oral ONCE-1800  . Warfarin - Pharmacist Dosing Inpatient   Does not apply q1800   Anti-infectives   Start     Dose/Rate Route Frequency Ordered Stop   03/22/13 1800  levofloxacin (LEVAQUIN) IVPB 750 mg     750 mg 100 mL/hr over 90 Minutes Intravenous Every 48 hours 03/22/13 1751        Assessment: 4 yoF admitted 4/19 with COPD exacerbation who is on chronic warfarin for history of DVT/PE (2.5mg  daily).  INR remains SUBtherapeutic at 1.52.  No bleeding complications noted  Patient is currently not on Lovenox/Heparin [sq], nor SCD's until INR therapeutic.  Goal of Therapy:  INR 2-3   Plan:  Coumadin 6 mg today.  Monitor for bleeding complications   Daily INR's, CBC.    Alenna Russell, Deetta Perla.D 03/24/2013, 8:41 AM

## 2013-03-24 NOTE — Progress Notes (Signed)
[name] [name] PROGRESS NOTE  Epiphany Widmer ZOX:[id number] DOB: September 20, 1941 DOA: 03/22/2013 PCP: No primary provider on file.  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   Renal failure  - Will hold nephrotoxic agents lisinopril and lasix  -gentle IVF ?baseline   Code Status: full Family Communication: patient at bedside Disposition Plan: home 2-3 days   Consultants:  none  Procedures:  none  Antibiotics:  levaquin  HPI/Subjective: +cough- wanting cough syrup- takes hydrocodone at home Wanting to go home  Objective: Filed Vitals:   03/23/13 1330 03/23/13 1931 03/23/13 2042 03/24/13 0651  BP: 179/80  154/56 159/74  Pulse: 99  89 83  Temp: 98.2 F (36.8 C)  97.8 F (36.6 C) 97.4 F (36.3 C)  TempSrc: Oral  Oral Oral  Resp: 22  22 20   Height:      Weight:    94.8 kg (208 lb 15.9 oz)  SpO2: 96% 97% 96% 97%    Intake/Output Summary (Last 24 hours) at 03/24/13 1014 Last data filed at 03/24/13 0903  Gross per 24 hour  Intake    840 ml  Output    950 ml  Net   -110 ml   Filed Weights   03/22/13 1800 03/23/13 0709 03/24/13 0651  Weight: 92.534 kg (204 lb) 93.2 kg (205 lb 7.5 oz) 94.8 kg (208 lb 15.9 oz)    Exam:   General:  A+Ox3, NAD  Cardiovascular: rrr  Respiratory: b/l wheezing and upper airway sounds  Abdomen: +BS, soft, NT/ND  Musculoskeletal: moves all 4 ext   Data Reviewed: Basic Metabolic Panel:  Recent Labs Lab 03/22/13 1347 03/22/13 1714 03/23/13 0510 03/24/13 0535  NA 143  --  142 142  K 3.4*  --  3.8 4.2  CL 105  --  106 107   CO2 25  --  23 23  GLUCOSE 135*  --  107* 136*  BUN 15  --  23 33*  CREATININE 1.46*  --  1.71* 1.56*  CALCIUM 9.6  --  9.4 9.1  MG  --  1.9  --   --   PHOS  --  2.6  --   --    Liver Function Tests:  Recent Labs Lab 03/22/13 1347  AST 29  ALT 20  ALKPHOS 115  BILITOT 0.4  PROT 6.7  ALBUMIN 3.8   No results found for this basename: LIPASE, AMYLASE,  in the last 168 hours No results found for this basename: AMMONIA,  in the last 168 hours CBC:  Recent Labs Lab 03/22/13 1347 03/23/13 0510 03/24/13 0535  WBC 8.6 11.3* 17.5*  NEUTROABS 6.3  --   --   HGB 12.7 11.5* 11.3*  HCT 36.4 33.3* 32.9*  MCV 91.7 92.2 92.9  PLT 236 215 233   Cardiac Enzymes: No results found for this basename: CKTOTAL, CKMB, CKMBINDEX, TROPONINI,  in the last 168 hours BNP (last 3 results)  Recent Labs  03/22/13 1346  PROBNP 481.7*   CBG: No results found for this basename: GLUCAP,  in the last 168 hours  No results found for this or any previous visit (from the past 240 hour(s)).   Studies: Dg Chest 2 View (if Patient Has Fever And/or Copd)  03/22/2013  *RADIOLOGY REPORT*  Clinical Data: Shortness of breath  CHEST - 2 VIEW  Comparison: None.  Findings: Lungs are clear. No pleural effusion or pneumothorax.  The heart is normal in size.  Mild degenerative changes of the visualized thoracolumbar spine.  IVC filter.  IMPRESSION: No evidence of acute cardiopulmonary disease.   Original Report Authenticated By: Charline Bills, M.D.     Scheduled Meds: . amLODipine  10 mg Oral Daily  . atorvastatin  40 mg Oral QHS  . cholecalciferol  800 Units Oral BID  . cloNIDine  0.3 mg Oral QPM  . isosorbide mononitrate  60 mg Oral Daily  . levofloxacin (LEVAQUIN) IV  750 mg Intravenous Q48H  . methylPREDNISolone (SOLU-MEDROL) injection  40 mg Intravenous Q8H  . mometasone-formoterol  2 puff Inhalation BID  . nebivolol  20 mg Oral Daily  . omega-3 acid ethyl esters  1 g Oral BID  . pantoprazole   40 mg Oral Daily  . sodium chloride  3 mL Intravenous Q12H  . sodium chloride  3 mL Intravenous Q12H  . warfarin  6 mg Oral ONCE-1800  . Warfarin - Pharmacist Dosing Inpatient   Does not apply q1800   Continuous Infusions: . sodium chloride 75 mL/hr at 03/23/13 1717    Active Problems:   COPD with acute exacerbation   PE (pulmonary embolism)   Renal failure   History of DVT (deep vein thrombosis)    Time spent: 25    Marlin Canary  [name] [name] Pager [phone]. If 7PM-7AM, please contact night-coverage at [email or web address], password [facility] 03/24/2013, 10:14 AM  LOS: 2 days

## 2013-03-24 NOTE — Progress Notes (Signed)
Utilization Review Completed Akiem Urieta J. Dillen Belmontes, RN, BSN, NCM [phone]  

## 2013-03-25 LAB — PROTIME-INR
INR: 1.88 — ABNORMAL HIGH (ref 0.00–1.49)
Prothrombin Time: 20.9 seconds — ABNORMAL HIGH (ref 11.6–15.2)

## 2013-03-25 MED ORDER — WARFARIN SODIUM 4 MG PO TABS
4.0000 mg | ORAL_TABLET | Freq: Once | ORAL | Status: DC
  Filled 2013-03-25: qty 1

## 2013-03-25 MED ORDER — HYDROCODONE-HOMATROPINE 5-1.5 MG/5ML PO SYRP: 5.0000 mL | ORAL_SOLUTION | Freq: Four times a day (QID) | ORAL | Status: DC | PRN

## 2013-03-25 MED ORDER — WARFARIN SODIUM 4 MG PO TABS: 4.0000 mg | ORAL_TABLET | Freq: Every day | ORAL | Status: DC

## 2013-03-25 MED ORDER — PREDNISONE 20 MG PO TABS: ORAL_TABLET | ORAL | Status: DC

## 2013-03-25 MED ORDER — LEVOFLOXACIN 750 MG PO TABS: 750.0000 mg | ORAL_TABLET | ORAL | Status: DC

## 2013-03-25 NOTE — Progress Notes (Signed)
ANTICOAGULATION CONSULT NOTE - Follow Up Consult  Pharmacy Consult for Coumadin; Levaquin Indication: History of DVT/PE, chronic anticoagulation; PNA  Recent Labs  03/22/13 1347 03/23/13 0510 03/24/13 0535 03/25/13 0435  HGB 12.7 11.5* 11.3*  --   HCT 36.4 33.3* 32.9*  --   PLT 236 215 233  --   CREATININE 1.46* 1.71* 1.56*  --    Lab Results  Component Value Date   INR 1.88* 03/25/2013   INR 1.52* 03/24/2013   INR 1.49 03/23/2013   Medications:  Scheduled:  . amLODipine  10 mg Oral Daily  . atorvastatin  40 mg Oral QHS  . cholecalciferol  800 Units Oral BID  . cloNIDine  0.3 mg Oral QPM  . isosorbide mononitrate  60 mg Oral Daily  . levofloxacin (LEVAQUIN) IV  750 mg Intravenous Q48H  . mometasone-formoterol  2 puff Inhalation BID  . nebivolol  20 mg Oral Daily  . omega-3 acid ethyl esters  1 g Oral BID  . pantoprazole  40 mg Oral Daily  . predniSONE  40 mg Oral Q breakfast  . sodium chloride  3 mL Intravenous Q12H  . sodium chloride  3 mL Intravenous Q12H  . [COMPLETED] warfarin  6 mg Oral ONCE-1800  . Warfarin - Pharmacist Dosing Inpatient   Does not apply q1800  . [DISCONTINUED] methylPREDNISolone (SOLU-MEDROL) injection  40 mg Intravenous Q8H   Assessment:  Day # 4 Levaquin for presumed PNA.  Patient receiving Levaquin q 48 hours per renal adjustment for CrCl 38 ml/min.  INR trending up to therapeutic goal, INR 1.88.  No bleeding complications noted   Will reduce Coumadin dose today due to likely interaction of Coumadin and Levaquin.  Goal of Therapy:  INR 2-3   Plan:  Continue Levaquin at current dose schedule.  Coumadin 4 mg po today.  Daily INR's, CBC.  Monitor for bleeding complications    Rayford Halsted.D 03/25/2013, 9:51 AM

## 2013-03-25 NOTE — Progress Notes (Signed)
SATURATION QUALIFICATIONS: (This note is used to comply with regulatory documentation for home oxygen)  Patient Saturations on Room Air at Rest = 95%  Patient Saturations on Room Air while Ambulating = 93%  Patient Saturations on n/a Liters of oxygen while Ambulating = n/a  Please briefly explain why patient needs home oxygen: Patient does not qualify for home O2

## 2013-03-25 NOTE — Progress Notes (Signed)
Patient's IV and telemetry has been discontinued, patient verbalizes understanding of discharge instructions and will be discharged home. D. Whitfield Dulay RN 

## 2013-03-25 NOTE — Discharge Summary (Addendum)
Physician Discharge Summary  [name] [name] YNW:[id number] DOB: [birth year] DOA: 03/22/2013  PCP: No primary provider on file.  Admit date: 03/22/2013 Discharge date: 03/25/2013  Time spent: 35 minutes  Recommendations for Outpatient Follow-up:  1. Resume lasix/lisinopril- watch renal function 2. PT/INR- d/c'd on larger than normal dose of coumadin and abx  Discharge Diagnoses:  Active Problems:   COPD with acute exacerbation   PE (pulmonary embolism)   Renal failure   History of DVT (deep vein thrombosis)   Discharge Condition: improved  Diet recommendation: cardiac  Filed Weights   03/23/13 0709 03/24/13 0651 03/25/13 0614  Weight: 93.2 kg (205 lb 7.5 oz) 94.8 kg (208 lb 15.9 oz) 96.253 kg (212 lb 3.2 oz)    History of present illness:  [name] [name] is a 72 y.o. female  With history of COPD not on home oxygen who presented to the ED after developing SOB while at a funeral. She reports that certain perfumes trigger her COPD and she thinks she may have got in contact with a perfume while at the funeral. Since Wednesday she reports increase in cough which is productive of clear sputum above her baseline and SOB. Her nebulizers at home were only minimally helping and her problem got worse and as such she decided to go to the urgent care center for further evaluation.  While in the ED received solumedrol and albuterol but given patient's wheezing we were consulted for admission evaluation and recommendations for Copd exacerbation.   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   Renal failure -  AKI on CKD - appears to be chronic  -gentle IVF  ?baseline=- follow up outpatient   Procedures:  none  Consultations:  none  Discharge Exam: Filed Vitals:   03/24/13 2100 03/25/13 0614 03/25/13 0913 03/25/13 1009  BP: 141/69 166/68  158/74  Pulse: 82 72  80  Temp: 97.2 F (36.2 C) 97.5 F (36.4 C)  97.7 F (36.5 C)  TempSrc: Oral Oral  Oral  Resp: 22 20  18   Height:      Weight:  96.253 kg (212 lb 3.2 oz)    SpO2: 96% 96% 94% 95%    General: A+Ox3, NAD Cardiovascular: rrr Respiratory: clear anterior  Discharge Instructions      Discharge Orders   Future Orders Complete By Expires     Diet - low sodium heart healthy  As directed     Discharge instructions  As directed     Comments:      Pt/INR on Thursday- d/c'd on larger dose of coumadin CBC, BMp 1 week    Driving Restrictions  As directed     Comments:      While taking cough medications- no driving    Increase activity slowly  As directed         Medication List    TAKE these medications       albuterol (2.5 MG/3ML) 0.083% nebulizer solution  Commonly known as:  PROVENTIL  Take 2.5 mg by nebulization every 4 (four) hours as needed for wheezing or  shortness of breath.     amLODipine 10 MG tablet  Commonly known as:  NORVASC  Take 10 mg by mouth daily.     atorvastatin 40 MG tablet  Commonly known as:  LIPITOR  Take 40 mg by mouth at bedtime.     BYSTOLIC 20 MG Tabs  Generic drug:  Nebivolol HCl  Take 20 mg by mouth daily.     CALCIUM 500 PO  Take 500 mg by mouth daily.     cholecalciferol 400 UNITS Tabs  Commonly known as:  VITAMIN D  Take 800 Units by mouth 2 (two) times daily.     cloNIDine 0.3 MG tablet  Commonly known as:  CATAPRES  Take 0.3 mg by mouth every evening.     Fish Oil 1200 MG Caps  Take 1,200 mg by mouth daily.     Fluticasone-Salmeterol 250-50 MCG/DOSE Aepb  Commonly known as:  ADVAIR  Inhale 1 puff into the lungs every 12 (twelve) hours.     furosemide 20 MG  tablet  Commonly known as:  LASIX  Take 20 mg by mouth daily.     guaiFENesin 600 MG 12 hr tablet  Commonly known as:  MUCINEX  Take 1,200 mg by mouth every 12 (twelve) hours as needed for congestion.     HYDROcodone-homatropine 5-1.5 MG/5ML syrup  Commonly known as:  HYCODAN  Take 5 mLs by mouth every 6 (six) hours as needed for cough.     ipratropium 0.02 % nebulizer solution  Commonly known as:  ATROVENT  Take 500 mcg by nebulization every 6 (six) hours as needed for wheezing ((with Xopenex)).     isosorbide mononitrate 60 MG 24 hr tablet  Commonly known as:  IMDUR  Take 60 mg by mouth daily.     levofloxacin 750 MG tablet  Commonly known as:  LEVAQUIN  Take 1 tablet (750 mg total) by mouth every other day.     lisinopril 20 MG tablet  Commonly known as:  PRINIVIL,ZESTRIL  Take 20 mg by mouth daily.     nitroGLYCERIN 0.4 MG SL tablet  Commonly known as:  NITROSTAT  Place 0.4 mg under the tongue every 5 (five) minutes as needed for chest pain.     omeprazole 20 MG capsule  Commonly known as:  PRILOSEC  Take 20 mg by mouth daily.     predniSONE 20 MG tablet  Commonly known as:  DELTASONE  40 mg x 3 days, 30 mg x 3 days, 20 mg x 3 days, 10 mg x 3 days then d/c     traMADol 50 MG tablet  Commonly known as:  ULTRAM  Take 50 mg by mouth every 6 (six) hours as needed for pain.     tretinoin 0.1 % cream  Commonly known as:  RETIN-A  Apply 1 application topically as directed.     vitamin E 1000 UNIT capsule  Take 1,000 Units by mouth daily.     warfarin 4 MG tablet  Commonly known as:  COUMADIN  Take 1 tablet (4 mg total) by mouth daily.          The results of significant diagnostics from this hospitalization (including imaging, microbiology, ancillary and laboratory) are listed below for reference.    Significant Diagnostic Studies: Dg Chest 2 View (if Patient Has Fever And/or Copd)  03/22/2013  *RADIOLOGY REPORT*  Clinical Data: Shortness of breath  CHEST  - 2 VIEW  Comparison: None.  Findings: Lungs are clear. No pleural  effusion or pneumothorax.  The heart is normal in size.  Mild degenerative changes of the visualized thoracolumbar spine.  IVC filter.  IMPRESSION: No evidence of acute cardiopulmonary disease.   Original Report Authenticated By: Charline Bills, M.D.     Microbiology: No results found for this or any previous visit (from the past 240 hour(s)).   Labs: Basic Metabolic Panel:  Recent Labs Lab 03/22/13 1347 03/22/13 1714 03/23/13 0510 03/24/13 0535  NA 143  --  142 142  K 3.4*  --  3.8 4.2  CL 105  --  106 107  CO2 25  --  23 23  GLUCOSE 135*  --  107* 136*  BUN 15  --  23 33*  CREATININE 1.46*  --  1.71* 1.56*  CALCIUM 9.6  --  9.4 9.1  MG  --  1.9  --   --   PHOS  --  2.6  --   --    Liver Function Tests:  Recent Labs Lab 03/22/13 1347  AST 29  ALT 20  ALKPHOS 115  BILITOT 0.4  PROT 6.7  ALBUMIN 3.8   No results found for this basename: LIPASE, AMYLASE,  in the last 168 hours No results found for this basename: AMMONIA,  in the last 168 hours CBC:  Recent Labs Lab 03/22/13 1347 03/23/13 0510 03/24/13 0535  WBC 8.6 11.3* 17.5*  NEUTROABS 6.3  --   --   HGB 12.7 11.5* 11.3*  HCT 36.4 33.3* 32.9*  MCV 91.7 92.2 92.9  PLT 236 215 233   Cardiac Enzymes: No results found for this basename: CKTOTAL, CKMB, CKMBINDEX, TROPONINI,  in the last 168 hours BNP: BNP (last 3 results)  Recent Labs  03/22/13 1346  PROBNP 481.7*   CBG: No results found for this basename: GLUCAP,  in the last 168 hours     Signed:  Benjamine Mola, JESSICA  [facility] 03/25/2013, 1:25 PM

## 2013-03-25 NOTE — Progress Notes (Signed)
03/25/13.Marland KitchenMarland KitchenPt PCP is Dr. Maisie Fus with [facility] in [place]. Justeen Hehr J. Lucretia Roers, RN, BSN, [employer] [phone]

## 2014-07-05 ENCOUNTER — Inpatient Hospital Stay (HOSPITAL_COMMUNITY): Payer: Medicare Other

## 2014-07-05 ENCOUNTER — Inpatient Hospital Stay (HOSPITAL_COMMUNITY)
Admission: AD | Admit: 2014-07-05 | Discharge: 2014-07-13 | DRG: 682 | Disposition: A | Discharge status: Home / Self Care | Payer: Medicare Other | Source: Other Acute Inpatient Hospital | Attending: Internal Medicine | Admitting: Internal Medicine

## 2014-07-05 DIAGNOSIS — Z8249 Family history of ischemic heart disease and other diseases of the circulatory system: Secondary | ICD-10-CM

## 2014-07-05 DIAGNOSIS — I509 Heart failure, unspecified: Secondary | ICD-10-CM | POA: Diagnosis present

## 2014-07-05 DIAGNOSIS — IMO0002 Reserved for concepts with insufficient information to code with codable children: Secondary | ICD-10-CM | POA: Diagnosis not present

## 2014-07-05 DIAGNOSIS — B952 Enterococcus as the cause of diseases classified elsewhere: Secondary | ICD-10-CM | POA: Diagnosis present

## 2014-07-05 DIAGNOSIS — N2581 Secondary hyperparathyroidism of renal origin: Secondary | ICD-10-CM | POA: Diagnosis present

## 2014-07-05 DIAGNOSIS — I5033 Acute on chronic diastolic (congestive) heart failure: Secondary | ICD-10-CM | POA: Diagnosis present

## 2014-07-05 DIAGNOSIS — Z95828 Presence of other vascular implants and grafts: Secondary | ICD-10-CM

## 2014-07-05 DIAGNOSIS — N179 Acute kidney failure, unspecified: Secondary | ICD-10-CM | POA: Diagnosis present

## 2014-07-05 DIAGNOSIS — M109 Gout, unspecified: Secondary | ICD-10-CM | POA: Diagnosis present

## 2014-07-05 DIAGNOSIS — D6489 Other specified anemias: Secondary | ICD-10-CM | POA: Diagnosis present

## 2014-07-05 DIAGNOSIS — N184 Chronic kidney disease, stage 4 (severe): Secondary | ICD-10-CM | POA: Diagnosis present

## 2014-07-05 DIAGNOSIS — I82509 Chronic embolism and thrombosis of unspecified deep veins of unspecified lower extremity: Secondary | ICD-10-CM | POA: Diagnosis present

## 2014-07-05 DIAGNOSIS — Z7901 Long term (current) use of anticoagulants: Secondary | ICD-10-CM

## 2014-07-05 DIAGNOSIS — Z8673 Personal history of transient ischemic attack (TIA), and cerebral infarction without residual deficits: Secondary | ICD-10-CM | POA: Diagnosis not present

## 2014-07-05 DIAGNOSIS — I252 Old myocardial infarction: Secondary | ICD-10-CM

## 2014-07-05 DIAGNOSIS — Z86718 Personal history of other venous thrombosis and embolism: Secondary | ICD-10-CM

## 2014-07-05 DIAGNOSIS — N390 Urinary tract infection, site not specified: Secondary | ICD-10-CM | POA: Diagnosis present

## 2014-07-05 DIAGNOSIS — K219 Gastro-esophageal reflux disease without esophagitis: Secondary | ICD-10-CM | POA: Diagnosis present

## 2014-07-05 DIAGNOSIS — Z79899 Other long term (current) drug therapy: Secondary | ICD-10-CM | POA: Diagnosis not present

## 2014-07-05 DIAGNOSIS — I129 Hypertensive chronic kidney disease with stage 1 through stage 4 chronic kidney disease, or unspecified chronic kidney disease: Secondary | ICD-10-CM | POA: Diagnosis present

## 2014-07-05 DIAGNOSIS — Z6837 Body mass index (BMI) 37.0-37.9, adult: Secondary | ICD-10-CM

## 2014-07-05 DIAGNOSIS — N170 Acute kidney failure with tubular necrosis: Principal | ICD-10-CM | POA: Diagnosis present

## 2014-07-05 DIAGNOSIS — N19 Unspecified kidney failure: Secondary | ICD-10-CM

## 2014-07-05 DIAGNOSIS — E875 Hyperkalemia: Secondary | ICD-10-CM | POA: Diagnosis present

## 2014-07-05 DIAGNOSIS — I169 Hypertensive crisis, unspecified: Secondary | ICD-10-CM | POA: Diagnosis not present

## 2014-07-05 DIAGNOSIS — I10 Essential (primary) hypertension: Secondary | ICD-10-CM

## 2014-07-05 DIAGNOSIS — M542 Cervicalgia: Secondary | ICD-10-CM | POA: Diagnosis present

## 2014-07-05 DIAGNOSIS — F17200 Nicotine dependence, unspecified, uncomplicated: Secondary | ICD-10-CM | POA: Diagnosis present

## 2014-07-05 DIAGNOSIS — I2699 Other pulmonary embolism without acute cor pulmonale: Secondary | ICD-10-CM

## 2014-07-05 DIAGNOSIS — Z86711 Personal history of pulmonary embolism: Secondary | ICD-10-CM

## 2014-07-05 DIAGNOSIS — E7800 Pure hypercholesterolemia, unspecified: Secondary | ICD-10-CM | POA: Diagnosis present

## 2014-07-05 DIAGNOSIS — I5030 Unspecified diastolic (congestive) heart failure: Secondary | ICD-10-CM

## 2014-07-05 DIAGNOSIS — I2589 Other forms of chronic ischemic heart disease: Secondary | ICD-10-CM | POA: Diagnosis present

## 2014-07-05 DIAGNOSIS — D62 Acute posthemorrhagic anemia: Secondary | ICD-10-CM | POA: Diagnosis not present

## 2014-07-05 DIAGNOSIS — J441 Chronic obstructive pulmonary disease with (acute) exacerbation: Secondary | ICD-10-CM | POA: Diagnosis present

## 2014-07-05 DIAGNOSIS — D500 Iron deficiency anemia secondary to blood loss (chronic): Secondary | ICD-10-CM | POA: Diagnosis present

## 2014-07-05 DIAGNOSIS — I2782 Chronic pulmonary embolism: Secondary | ICD-10-CM | POA: Diagnosis present

## 2014-07-05 DIAGNOSIS — I214 Non-ST elevation (NSTEMI) myocardial infarction: Secondary | ICD-10-CM

## 2014-07-05 DIAGNOSIS — I639 Cerebral infarction, unspecified: Secondary | ICD-10-CM | POA: Diagnosis present

## 2014-07-05 DIAGNOSIS — Z720 Tobacco use: Secondary | ICD-10-CM

## 2014-07-05 DIAGNOSIS — I213 ST elevation (STEMI) myocardial infarction of unspecified site: Secondary | ICD-10-CM

## 2014-07-05 HISTORY — DX: Shortness of breath: R06.02

## 2014-07-05 HISTORY — DX: Chronic obstructive pulmonary disease, unspecified: J44.9

## 2014-07-05 HISTORY — DX: Gastro-esophageal reflux disease without esophagitis: K21.9

## 2014-07-05 HISTORY — DX: Unspecified asthma, uncomplicated: J45.909

## 2014-07-05 LAB — COMPREHENSIVE METABOLIC PANEL
ALT: 21 U/L (ref 0–35)
AST: 20 U/L (ref 0–37)
Albumin: 3.3 g/dL — ABNORMAL LOW (ref 3.5–5.2)
Alkaline Phosphatase: 89 U/L (ref 39–117)
Anion gap: 16 — ABNORMAL HIGH (ref 5–15)
BUN: 117 mg/dL — ABNORMAL HIGH (ref 6–23)
CALCIUM: 8 mg/dL — AB (ref 8.4–10.5)
CO2: 19 mEq/L (ref 19–32)
Chloride: 104 mEq/L (ref 96–112)
Creatinine, Ser: 6.41 mg/dL — ABNORMAL HIGH (ref 0.50–1.10)
GFR calc Af Amer: 7 mL/min — ABNORMAL LOW (ref >90)
GFR, EST NON AFRICAN AMERICAN: 6 mL/min — AB (ref >90)
Glucose, Bld: 108 mg/dL — ABNORMAL HIGH (ref 70–99)
POTASSIUM: 6.1 meq/L — AB (ref 3.7–5.3)
Sodium: 139 mEq/L (ref 137–147)
Total Bilirubin: <0.2 mg/dL — ABNORMAL LOW (ref 0.3–1.2)
Total Protein: 5.5 g/dL — ABNORMAL LOW (ref 6.0–8.3)

## 2014-07-05 LAB — CBC
HCT: 29 % — ABNORMAL LOW (ref 36.0–46.0)
Hemoglobin: 9.1 g/dL — ABNORMAL LOW (ref 12.0–15.0)
MCH: 29.3 pg (ref 26.0–34.0)
MCHC: 31.4 g/dL (ref 30.0–36.0)
MCV: 93.2 fL (ref 78.0–100.0)
Platelets: 237 10*3/uL (ref 150–400)
RBC: 3.11 MIL/uL — AB (ref 3.87–5.11)
RDW: 15.6 % — ABNORMAL HIGH (ref 11.5–15.5)
WBC: 11.5 10*3/uL — AB (ref 4.0–10.5)

## 2014-07-05 LAB — MAGNESIUM: Magnesium: 2.5 mg/dL (ref 1.5–2.5)

## 2014-07-05 LAB — LACTIC ACID, PLASMA: Lactic Acid, Venous: 0.8 mmol/L (ref 0.5–2.2)

## 2014-07-05 LAB — MRSA PCR SCREENING: MRSA by PCR: NEGATIVE

## 2014-07-05 LAB — TROPONIN I: TROPONIN I: 0.63 ng/mL — AB (ref <0.30)

## 2014-07-05 LAB — APTT: APTT: 24 s (ref 24–37)

## 2014-07-05 LAB — PROCALCITONIN: Procalcitonin: 0.29 ng/mL

## 2014-07-05 LAB — PROTIME-INR
INR: 1.1 (ref 0.00–1.49)
Prothrombin Time: 14.2 seconds (ref 11.6–15.2)

## 2014-07-05 LAB — PHOSPHORUS: PHOSPHORUS: 7.6 mg/dL — AB (ref 2.3–4.6)

## 2014-07-05 MED ORDER — NITROGLYCERIN 0.4 MG SL SUBL: 0.4000 mg | SUBLINGUAL_TABLET | SUBLINGUAL | Status: DC | PRN

## 2014-07-05 MED ORDER — DEXTROSE 5 % IV SOLN
1.0000 g | INTRAVENOUS | Status: DC
  Administered 2014-07-05: 1 g via INTRAVENOUS
  Filled 2014-07-05 (×2): qty 10

## 2014-07-05 MED ORDER — METHYLPREDNISOLONE SODIUM SUCC 40 MG IJ SOLR
40.0000 mg | Freq: Three times a day (TID) | INTRAMUSCULAR | Status: DC
  Administered 2014-07-05 – 2014-07-06 (×2): 40 mg via INTRAVENOUS
  Filled 2014-07-05 (×6): qty 1

## 2014-07-05 MED ORDER — MORPHINE SULFATE 2 MG/ML IJ SOLN
2.0000 mg | INTRAMUSCULAR | Status: DC | PRN
  Administered 2014-07-07: 2 mg via INTRAVENOUS
  Filled 2014-07-05 (×2): qty 1

## 2014-07-05 MED ORDER — PANTOPRAZOLE SODIUM 40 MG IV SOLR: 40.0000 mg | Freq: Every day | INTRAVENOUS | Status: DC

## 2014-07-05 MED ORDER — ALBUTEROL SULFATE (2.5 MG/3ML) 0.083% IN NEBU: 2.5000 mg | INHALATION_SOLUTION | Freq: Four times a day (QID) | RESPIRATORY_TRACT | Status: DC

## 2014-07-05 MED ORDER — SODIUM POLYSTYRENE SULFONATE 15 GM/60ML PO SUSP
60.0000 g | Freq: Once | ORAL | Status: AC
  Administered 2014-07-05: 60 g via ORAL
  Filled 2014-07-05: qty 240

## 2014-07-05 MED ORDER — CLONIDINE HCL 0.3 MG PO TABS
0.3000 mg | ORAL_TABLET | Freq: Every evening | ORAL | Status: DC
  Administered 2014-07-05 – 2014-07-07 (×3): 0.3 mg via ORAL
  Filled 2014-07-05 (×4): qty 1

## 2014-07-05 MED ORDER — HEPARIN SODIUM (PORCINE) 5000 UNIT/ML IJ SOLN
5000.0000 [IU] | Freq: Three times a day (TID) | INTRAMUSCULAR | Status: DC
  Administered 2014-07-05 – 2014-07-06 (×2): 5000 [IU] via SUBCUTANEOUS
  Filled 2014-07-05 (×5): qty 1

## 2014-07-05 MED ORDER — METHYLPREDNISOLONE SODIUM SUCC 40 MG IJ SOLR
40.0000 mg | Freq: Two times a day (BID) | INTRAMUSCULAR | Status: DC
  Filled 2014-07-05 (×2): qty 1

## 2014-07-05 MED ORDER — AZITHROMYCIN 500 MG PO TABS
500.0000 mg | ORAL_TABLET | Freq: Once | ORAL | Status: AC
  Administered 2014-07-05: 500 mg via ORAL
  Filled 2014-07-05: qty 1

## 2014-07-05 MED ORDER — IPRATROPIUM-ALBUTEROL 0.5-2.5 (3) MG/3ML IN SOLN
3.0000 mL | Freq: Four times a day (QID) | RESPIRATORY_TRACT | Status: DC
  Administered 2014-07-05 – 2014-07-08 (×8): 3 mL via RESPIRATORY_TRACT
  Filled 2014-07-05 (×6): qty 3
  Filled 2014-07-05: qty 30
  Filled 2014-07-05 (×2): qty 3

## 2014-07-05 MED ORDER — ASPIRIN EC 81 MG PO TBEC
81.0000 mg | DELAYED_RELEASE_TABLET | Freq: Every day | ORAL | Status: DC
  Administered 2014-07-05 – 2014-07-13 (×9): 81 mg via ORAL
  Filled 2014-07-05 (×10): qty 1

## 2014-07-05 MED ORDER — IPRATROPIUM-ALBUTEROL 0.5-2.5 (3) MG/3ML IN SOLN: 3.0000 mL | Freq: Four times a day (QID) | RESPIRATORY_TRACT | Status: DC

## 2014-07-05 MED ORDER — AMLODIPINE BESYLATE 10 MG PO TABS
10.0000 mg | ORAL_TABLET | Freq: Every day | ORAL | Status: DC
  Administered 2014-07-05 – 2014-07-07 (×3): 10 mg via ORAL
  Filled 2014-07-05 (×4): qty 1

## 2014-07-05 MED ORDER — SODIUM CHLORIDE 0.9 % IV SOLN: INTRAVENOUS | Status: DC

## 2014-07-05 MED ORDER — HEPARIN SODIUM (PORCINE) 1000 UNIT/ML IJ SOLN
3000.0000 [IU] | Freq: Once | INTRAMUSCULAR | Status: AC
  Administered 2014-07-05: 2800 [IU]
  Filled 2014-07-05 (×2): qty 3

## 2014-07-05 MED ORDER — ENOXAPARIN SODIUM 30 MG/0.3ML ~~LOC~~ SOLN: 30.0000 mg | SUBCUTANEOUS | Status: DC

## 2014-07-05 MED ORDER — DEXTROSE 5 % IV SOLN
250.0000 mg | INTRAVENOUS | Status: DC
  Filled 2014-07-05: qty 250

## 2014-07-05 MED ORDER — ISOSORBIDE MONONITRATE ER 60 MG PO TB24
60.0000 mg | ORAL_TABLET | Freq: Every day | ORAL | Status: DC
  Administered 2014-07-05 – 2014-07-13 (×9): 60 mg via ORAL
  Filled 2014-07-05 (×9): qty 1

## 2014-07-05 NOTE — H&P (Addendum)
PULMONARY / CRITICAL CARE MEDICINE   Name: Jovonne Wilton MRN: [id number] DOB: 1941/03/09    ADMISSION DATE:  07/05/2014   REFERRING MD :  Verdie Shire.  CHIEF COMPLAINT:  Shoulder and neck pain  INITIAL PRESENTATION: 73 y.o smoker with COPD, diastolic CHF , IVC filter transferred from [facility] for AKI and nSTEMI  STUDIES:    SIGNIFICANT EVENTS: 8/2 tx to [facility]   HISTORY OF PRESENT ILLNESS:   73 yo recent smoker who presented to [facility] 7/28 with shoulder and jaw pain, wheezes, sob and was positive for STEMI with peak trop i 6.17. She was aggressively diuresed but her renal function quickly deteriorated with peak creatine 6.1 despite stopping lasix and antihypertensive ace-i. Treated for AECOPD with abx/steroids/O2/BD's. She has transferred to [facility] hospital SDU for cards/renal consults and PCCm will admit and manage her pulmonary issues.  PAST MEDICAL HISTORY :  Past Medical History  Diagnosis Date  . Stroke   . Hypertension   . MI (myocardial infarction)   . History of blood clots   . Renal disorder   . High cholesterol    Past Surgical History  Procedure Laterality Date  . Abdominal hysterectomy     Prior to Admission medications   Medication Sig Start Date End Date Taking? Authorizing Provider  albuterol (PROVENTIL) (2.5 MG/3ML) 0.083% nebulizer solution Take 2.5 mg by nebulization every 4 (four) hours as needed for wheezing or shortness of breath.    Historical Provider, MD  amLODipine (NORVASC) 10 MG tablet Take 10 mg by mouth daily.    Historical Provider, MD  atorvastatin (LIPITOR) 40 MG tablet Take 40 mg by mouth at bedtime.    Historical Provider, MD  Calcium-Magnesium-Vitamin D (CALCIUM 500 PO) Take 500 mg by mouth daily.    Historical Provider, MD  cholecalciferol (VITAMIN D) 400 UNITS TABS Take 800 Units by mouth 2 (two) times daily.    Historical Provider, MD  cloNIDine (CATAPRES) 0.3 MG tablet Take 0.3 mg by mouth every evening.     Historical Provider,  MD  Fluticasone-Salmeterol (ADVAIR) 250-50 MCG/DOSE AEPB Inhale 1 puff into the lungs every 12 (twelve) hours.    Historical Provider, MD  furosemide (LASIX) 20 MG tablet Take 20 mg by mouth daily.    Historical Provider, MD  guaiFENesin (MUCINEX) 600 MG 12 hr tablet Take 1,200 mg by mouth every 12 (twelve) hours as needed for congestion.    Historical Provider, MD  HYDROcodone-homatropine (HYCODAN) 5-1.5 MG/5ML syrup Take 5 mLs by mouth every 6 (six) hours as needed for cough. 03/25/13   Joseph Art, DO  ipratropium (ATROVENT) 0.02 % nebulizer solution Take 500 mcg by nebulization every 6 (six) hours as needed for wheezing ((with Xopenex)).    Historical Provider, MD  isosorbide mononitrate (IMDUR) 60 MG 24 hr tablet Take 60 mg by mouth daily.    Historical Provider, MD  levofloxacin (LEVAQUIN) 750 MG tablet Take 1 tablet (750 mg total) by mouth every other day. 03/25/13   Joseph Art, DO  lisinopril (PRINIVIL,ZESTRIL) 20 MG tablet Take 20 mg by mouth daily.    Historical Provider, MD  Nebivolol HCl (BYSTOLIC) 20 MG TABS Take 20 mg by mouth daily.    Historical Provider, MD  nitroGLYCERIN (NITROSTAT) 0.4 MG SL tablet Place 0.4 mg under the tongue every 5 (five) minutes as needed for chest pain.    Historical Provider, MD  Omega-3 Fatty Acids (FISH OIL) 1200 MG CAPS Take 1,200 mg by mouth daily.  Historical Provider, MD  omeprazole (PRILOSEC) 20 MG capsule Take 20 mg by mouth daily.    Historical Provider, MD  predniSONE (DELTASONE) 20 MG tablet 40 mg x 3 days, 30 mg x 3 days, 20 mg x 3 days, 10 mg x 3 days then d/c 03/25/13   Joseph Art, DO  traMADol (ULTRAM) 50 MG tablet Take 50 mg by mouth every 6 (six) hours as needed for pain.    Historical Provider, MD  tretinoin (RETIN-A) 0.1 % cream Apply 1 application topically as directed.    Historical Provider, MD  vitamin E 1000 UNIT capsule Take 1,000 Units by mouth daily.    Historical Provider, MD  warfarin (COUMADIN) 4 MG tablet Take 1  tablet (4 mg total) by mouth daily. 03/25/13   Joseph Art, DO   No Known Allergies  FAMILY HISTORY:  No family history on file. SOCIAL HISTORY:  reports that she quit smoking about 17 months ago. She does not have any smokeless tobacco history on file. She reports that she does not drink alcohol or use illicit drugs.  REVIEW OF SYSTEMS:   Constitutional: negative for anorexia, fevers and sweats  Eyes: negative for irritation, redness and visual disturbance  Ears, nose, mouth, throat, and face: negative for earaches, epistaxis, nasal congestion and sore throat c/o dryness Respiratory: negative for cough, dyspnea on exertion, sputum c/o wheezing  Cardiovascular: negative for chest pain, dyspnea, lower extremity edema, orthopnea, palpitations and syncope  Gastrointestinal: negative for abdominal pain, constipation, diarrhea, melena, nausea and vomiting  Genitourinary:negative for dysuria, frequency and hematuria  Hematologic/lymphatic: negative for bleeding, easy bruising and lymphadenopathy  Musculoskeletal:negative for arthralgias, muscle weakness and stiff joints  Neurological: negative for coordination problems, gait problems, headaches and weakness  Endocrine: negative for diabetic symptoms including polydipsia, polyuria and weight loss    SUBJECTIVE:   VITAL SIGNS: Pulse Rate:  [71] 71 (08/02 1600) Resp:  [17] 17 (08/02 1600) BP: (134)/(64) 134/64 mmHg (08/02 1600) SpO2:  [99 %] 99 % (08/02 1600) HEMODYNAMICS:   VENTILATOR SETTINGS:   INTAKE / OUTPUT: No intake or output data in the 24 hours ending 07/05/14 1612  PHYSICAL EXAMINATION: General:  Obese WF nad at rest Neuro:  Intact, poor stm HEENT:  No JVD/LAN Cardiovascular:  HSR RRR Lungs: Coarse rhonchi diffuse BL exp wheeze Abdomen:  Obese + bs Musculoskeletal:  intact Skin:  Warm + le edema  LABS:  CBC No results found for this basename: WBC, HGB, HCT, PLT,  in the last 168 hours Coag's No results found  for this basename: APTT, INR,  in the last 168 hours BMET No results found for this basename: NA, K, CL, CO2, BUN, CREATININE, GLUCOSE,  in the last 168 hours Electrolytes No results found for this basename: CALCIUM, MG, PHOS,  in the last 168 hours Sepsis Markers No results found for this basename: LATICACIDVEN, PROCALCITON, O2SATVEN,  in the last 168 hours ABG No results found for this basename: PHART, PCO2ART, PO2ART,  in the last 168 hours Liver Enzymes No results found for this basename: AST, ALT, ALKPHOS, BILITOT, ALBUMIN,  in the last 168 hours Cardiac Enzymes No results found for this basename: TROPONINI, PROBNP,  in the last 168 hours Glucose No results found for this basename: GLUCAP,  in the last 168 hours  Imaging No results found.  Active Problems:   COPD with acute exacerbation   History of DVT (deep vein thrombosis)   STEMI (ST elevation myocardial infarction)   Acute renal  failure   Tobacco abuse   HTN (hypertension)   CHF (congestive heart failure)   GERD (gastroesophageal reflux disease)   Gout   S/P IVC filter   CVA (cerebral vascular accident)   ASSESSMENT / PLAN:  PULMONARY OETT  A: COPD with acute exacerbation  H/o PE, s/p IVC filter P:   O2 as needed BD's Solumedrol 40 q 8h Abx  CARDIOVASCULAR CVL  A:  STEMI HTN Chest Pain P:  Cards consult(no cath with renal failure) Resume amlodipin, imdur, resume clonidine to avoid rebound Hold BB with bspasm, hold lisinopril Consider hydralazine if BP remains high on this regimen MSO4/ntg  RENAL A:   Acute on chronic renal failure with baseline creatine 1.6 on 03/2013  Creatinine 6.7 8/2  P:   Renal consult  GASTROINTESTINAL A:   GERD P:   PPI  HEMATOLOGIC A:   No Acute Issue P:    INFECTIOUS A:   AECOPD P:   BCx2  UC  Sputum Abx:  roc, start date7/29, day 4/7/ Abx: zithro, start date7/29, day 4/5  ENDOCRINE A:   NO ACUTE ISSUE  P:     NEUROLOGIC A:   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 She will eventually need cardiac risk stratification , cath contra-indicated    I have personally obtained a history, examined the patient, evaluated laboratory and imaging results, formulated the assessment and plan and placed orders. CRITICAL CARE: The patient is critically ill with multiple organ systems failure and requires high complexity decision making for assessment and support, frequent evaluation and titration of therapies, application of advanced monitoring technologies and extensive interpretation of multiple databases. Critical Care Time devoted to patient care services described in this note is 50  minutes.    Oretha MilchALVA,Princessa Lesmeister V.  MD  07/05/2014, 4:12 PM

## 2014-07-05 NOTE — Consult Note (Addendum)
Renal Service Consult Note [facility]  [name] [name] 07/05/2014 [name] [name] D Requesting Physician:  Dr [name] [name]  Reason for Consult:  Acute on CKD HPI: The patient is a 73 y.o. year-old with hx of HTN, CVA, MI, blood clots, and CKD stage 3-4.  He was admitted on [facility] on 7/28 with shoulder and jaw pains, SOB and ^trop suggesting STEMI.  She was aggressively diuresed and her creatinine went up rapidly per reports and was measured at 7 today (1.5- 2.5 prior). Patient is without complaints, no SOB, abd swelling, N/V, anorexia, severe fatigue.    No CP, prod cough, no memory loss, no paresthesias  Past Medical History  Past Medical History  Diagnosis Date  . Stroke   . Hypertension   . MI (myocardial infarction)   . History of blood clots   . Renal disorder   . High cholesterol    Past Surgical History  Past Surgical History  Procedure Laterality Date  . Abdominal hysterectomy     Family History No family history on file. Social History  reports that she quit smoking about 17 months ago. She does not have any smokeless tobacco history on file. She reports that she does not drink alcohol or use illicit drugs. Allergies  Allergies  Allergen Reactions  . Ciprofloxacin     rash   Home medications Prior to Admission medications   Medication Sig Start Date End Date Taking? Authorizing Provider  amLODipine (NORVASC) 10 MG tablet Take 10 mg by mouth daily.   Yes Historical Provider, MD  atorvastatin (LIPITOR) 40 MG tablet Take 40 mg by mouth at bedtime.   Yes Historical Provider, MD  beclomethasone (QVAR) 80 MCG/ACT inhaler Inhale 2 puffs into the lungs 2 (two) times daily.   Yes Historical Provider, MD  Calcium-Magnesium-Vitamin D (CALCIUM 500 PO) Take 500 mg by mouth daily.   Yes Historical Provider, MD  cloNIDine (CATAPRES) 0.3 MG tablet Take 0.3 mg by mouth every evening.    Yes Historical Provider, MD  Fluticasone-Salmeterol (ADVAIR) 250-50 MCG/DOSE AEPB  Inhale 1 puff into the lungs every 12 (twelve) hours.   Yes Historical Provider, MD  furosemide (LASIX) 20 MG tablet Take 20 mg by mouth daily.   Yes Historical Provider, MD  guaiFENesin (MUCINEX) 600 MG 12 hr tablet Take 1,200 mg by mouth every 12 (twelve) hours as needed for congestion.   Yes Historical Provider, MD  HYDROcodone-homatropine (HYCODAN) 5-1.5 MG/5ML syrup Take 5 mLs by mouth every 6 (six) hours as needed for cough. 03/25/13  Yes [name] U Vann, DO  ipratropium (ATROVENT) 0.02 % nebulizer solution Take 500 mcg by nebulization every 6 (six) hours as needed for wheezing ((with Xopenex)).   Yes Historical Provider, MD  isosorbide mononitrate (IMDUR) 60 MG 24 hr tablet Take 60 mg by mouth daily.   Yes Historical Provider, MD  levofloxacin (LEVAQUIN) 750 MG tablet Take 1 tablet (750 mg total) by mouth every other day. 03/25/13  Yes [name] Art, DO  lisinopril (PRINIVIL,ZESTRIL) 20 MG tablet Take 20 mg by mouth daily.   Yes Historical Provider, MD  mometasone-formoterol (DULERA) 200-5 MCG/ACT AERO Inhale 2 puffs into the lungs 2 (two) times daily.   Yes Historical Provider, MD  Nebivolol HCl (BYSTOLIC) 20 MG TABS Take 20 mg by mouth daily.   Yes Historical Provider, MD  omeprazole (PRILOSEC) 20 MG capsule Take 20 mg by mouth daily.   Yes Historical Provider, MD  predniSONE (DELTASONE) 20 MG tablet Take 20 mg by mouth  See admin instructions. 40 mg x 3 days, 30 mg x 3 days, 20 mg x 3 days, 10 mg x 3 days then discontinue   Yes Historical Provider, MD  traMADol (ULTRAM) 50 MG tablet Take 50 mg by mouth every 6 (six) hours as needed for pain.   Yes Historical Provider, MD  tretinoin (RETIN-A) 0.1 % cream Apply 1 application topically as directed.   Yes Historical Provider, MD  albuterol (PROVENTIL) (2.5 MG/3ML) 0.083% nebulizer solution Take 2.5 mg by nebulization every 4 (four) hours as needed for wheezing or shortness of breath.    Historical Provider, MD  nitroGLYCERIN (NITROSTAT) 0.4 MG SL  tablet Place 0.4 mg under the tongue every 5 (five) minutes as needed for chest pain.    Historical Provider, MD   Liver Function Tests  Recent Labs Lab 07/05/14 1725  AST 20  ALT 21  ALKPHOS 89  BILITOT <0.2*  PROT 5.5*  ALBUMIN 3.3*   No results found for this basename: LIPASE, AMYLASE,  in the last 168 hours CBC  Recent Labs Lab 07/05/14 1725  WBC 11.5*  HGB 9.1*  HCT 29.0*  MCV 93.2  PLT 237   Basic Metabolic Panel  Recent Labs Lab 07/05/14 1725  NA 139  K 6.1*  CL 104  CO2 19  GLUCOSE 108*  BUN 117*  CREATININE 6.41*  CALCIUM 8.0*  PHOS 7.6*    Filed Vitals:   07/05/14 2017 07/05/14 2019 07/05/14 2026 07/05/14 2125  BP: 131/55 131/55  147/49  Pulse:    74  Temp:      TempSrc:      Resp:    18  Height:      Weight:      SpO2:   98% 93%   Exam Alert, not in distress, looks tired No rash, cyanosis or gangrene Sclera anicteric, throat clear No jvd Chest clear on R , rales L base RRR 2/6 SEM no RG Abd soft, NTND, no ascites or HSM Trace edema bilat LE's  Neuro is nf, Ox 3, no asterixis No rash , cyanosis or gangrene  UA pending CXR pending  Assessment: 1 Acute on CKD - BUN 117 and creat 6.41 today.  She probably has early uremic symptoms.  Place Foley and write HD orders for tomorrow.  BP's are high and she should not need CRRT if she remains like this. We don't have baseline creat data except for last year in 2014.  [place] told her that she had "stage 4 " kidney failure.   2 CKD stage 3/4, unclear baseline, last data we have is from 2014 but she sees a renal MD in [place] 3 Acute STEMI 4 Hyperkalemia s/p kayexalate 5 HL 6 HTN continue norvasc, clonidine for now, fluid removal should help also  Plan- Don't think she should go another day without dialysis; temp HD cath is already in .  Does not need emergent HD tonight, but will plan on HD tomorrow unless there is dramatic improvement of renal function in  which case we would hold plans for HD.  She may recover function or may not. Explained to pt in detail and answered questions.   [name] Moselleob Caison Hearn MD (pgr) [phone]    ([phone] 07/05/2014, 10:48 PM

## 2014-07-05 NOTE — Procedures (Signed)
Central Venous Catheter Insertion Procedure Note Cosma Pollino [id number] Jun 08, 1941  Procedure: Insertion of Central Venous Catheter Indications: HD  Procedure Details Consent: Risks of procedure as well as the alternatives and risks of each were explained to the (patient/caregiver).  Consent for procedure obtained.  Time Out: Verified patient identification, verified procedure, site/side was marked, verified correct patient position, special equipment/implants available, medications/allergies/relevent history reviewed, required imaging and test results available.  Performed  Maximum sterile technique was used including antiseptics, cap, gloves, gown, hand hygiene, mask and sheet.  Skin prep: Chlorhexidine; local anesthetic administered  An antimicrobial bonded/coated Trialysis catheter was placed in the right internal jugular vein using the Seldinger technique.  Evaluation Blood flow good Complications: No apparent complications Patient did tolerate procedure well. Chest X-ray ordered to verify placement.  CXR: pending.  Manette Doto R. 07/05/2014, 10:41 PM  I used ultrasound to locate and access the vein/artery.

## 2014-07-05 NOTE — Progress Notes (Signed)
eLink Physician-Brief Progress Note Patient Name: [name] [name] DOB: 1941-06-12 MRN: [id number]  Date of Service  07/05/2014   HPI/Events of Note   Potassium 6.1 in setting of acute renal failure  eICU Interventions  kayexelate 60mg  ordered.  Renal to see, will follow up recs.    Intervention Category Intermediate Interventions: Electrolyte abnormality - evaluation and management  Henry Utsey K. 07/05/2014, 8:18 PM

## 2014-07-06 ENCOUNTER — Encounter (HOSPITAL_COMMUNITY): Payer: Self-pay | Admitting: Physician Assistant

## 2014-07-06 DIAGNOSIS — I10 Essential (primary) hypertension: Secondary | ICD-10-CM

## 2014-07-06 DIAGNOSIS — I219 Acute myocardial infarction, unspecified: Secondary | ICD-10-CM

## 2014-07-06 DIAGNOSIS — I509 Heart failure, unspecified: Secondary | ICD-10-CM

## 2014-07-06 DIAGNOSIS — I5033 Acute on chronic diastolic (congestive) heart failure: Secondary | ICD-10-CM

## 2014-07-06 DIAGNOSIS — I214 Non-ST elevation (NSTEMI) myocardial infarction: Secondary | ICD-10-CM | POA: Diagnosis present

## 2014-07-06 DIAGNOSIS — F17200 Nicotine dependence, unspecified, uncomplicated: Secondary | ICD-10-CM

## 2014-07-06 DIAGNOSIS — Z86718 Personal history of other venous thrombosis and embolism: Secondary | ICD-10-CM

## 2014-07-06 LAB — CBC
HCT: 27.1 % — ABNORMAL LOW (ref 36.0–46.0)
Hemoglobin: 8.5 g/dL — ABNORMAL LOW (ref 12.0–15.0)
MCH: 29.1 pg (ref 26.0–34.0)
MCHC: 31.4 g/dL (ref 30.0–36.0)
MCV: 92.8 fL (ref 78.0–100.0)
PLATELETS: 234 10*3/uL (ref 150–400)
RBC: 2.92 MIL/uL — ABNORMAL LOW (ref 3.87–5.11)
RDW: 15.4 % (ref 11.5–15.5)
WBC: 10.7 10*3/uL — ABNORMAL HIGH (ref 4.0–10.5)

## 2014-07-06 LAB — URINE MICROSCOPIC-ADD ON

## 2014-07-06 LAB — BASIC METABOLIC PANEL
ANION GAP: 19 — AB (ref 5–15)
BUN: 126 mg/dL — ABNORMAL HIGH (ref 6–23)
CALCIUM: 8 mg/dL — AB (ref 8.4–10.5)
CO2: 19 mEq/L (ref 19–32)
CREATININE: 6.81 mg/dL — AB (ref 0.50–1.10)
Chloride: 107 mEq/L (ref 96–112)
GFR calc non Af Amer: 5 mL/min — ABNORMAL LOW (ref >90)
GFR, EST AFRICAN AMERICAN: 6 mL/min — AB (ref >90)
Glucose, Bld: 118 mg/dL — ABNORMAL HIGH (ref 70–99)
Potassium: 5.7 mEq/L — ABNORMAL HIGH (ref 3.7–5.3)
Sodium: 145 mEq/L (ref 137–147)

## 2014-07-06 LAB — LEGIONELLA ANTIGEN, URINE: Legionella Antigen, Urine: NEGATIVE

## 2014-07-06 LAB — FERRITIN: Ferritin: 36 ng/mL (ref 10–291)

## 2014-07-06 LAB — URINALYSIS, ROUTINE W REFLEX MICROSCOPIC
BILIRUBIN URINE: NEGATIVE
GLUCOSE, UA: NEGATIVE mg/dL
HGB URINE DIPSTICK: NEGATIVE
Ketones, ur: NEGATIVE mg/dL
Nitrite: NEGATIVE
PROTEIN: NEGATIVE mg/dL
Specific Gravity, Urine: 1.019 (ref 1.005–1.030)
UROBILINOGEN UA: 0.2 mg/dL (ref 0.0–1.0)
pH: 5 (ref 5.0–8.0)

## 2014-07-06 LAB — POCT I-STAT 3, ART BLOOD GAS (G3+)
ACID-BASE DEFICIT: 6 mmol/L — AB (ref 0.0–2.0)
BICARBONATE: 20.3 meq/L (ref 20.0–24.0)
O2 Saturation: 97 %
PH ART: 7.307 — AB (ref 7.350–7.450)
PO2 ART: 94 mmHg (ref 80.0–100.0)
TCO2: 21 mmol/L (ref 0–100)
pCO2 arterial: 40.5 mmHg (ref 35.0–45.0)

## 2014-07-06 LAB — TROPONIN I
Troponin I: 0.99 ng/mL (ref <0.30)
Troponin I: 1.8 ng/mL (ref <0.30)

## 2014-07-06 LAB — IRON AND TIBC
Iron: 190 ug/dL — ABNORMAL HIGH (ref 42–135)
Saturation Ratios: 63 % — ABNORMAL HIGH (ref 20–55)
TIBC: 301 ug/dL (ref 250–470)
UIBC: 111 ug/dL — ABNORMAL LOW (ref 125–400)

## 2014-07-06 LAB — SODIUM, URINE, RANDOM: Sodium, Ur: <20 mEq/L

## 2014-07-06 LAB — STREP PNEUMONIAE URINARY ANTIGEN: Strep Pneumo Urinary Antigen: NEGATIVE

## 2014-07-06 LAB — HEPATITIS B SURFACE ANTIGEN: Hepatitis B Surface Ag: NEGATIVE

## 2014-07-06 LAB — HEPATITIS B CORE ANTIBODY, IGM: Hep B C IgM: NONREACTIVE

## 2014-07-06 LAB — CREATININE, URINE, RANDOM: Creatinine, Urine: 104.56 mg/dL

## 2014-07-06 LAB — HEPATITIS B SURFACE ANTIBODY,QUALITATIVE: HEP B S AB: NEGATIVE

## 2014-07-06 MED ORDER — LIDOCAINE-PRILOCAINE 2.5-2.5 % EX CREA
1.0000 "application " | TOPICAL_CREAM | CUTANEOUS | Status: DC | PRN
  Filled 2014-07-06: qty 5

## 2014-07-06 MED ORDER — HEPARIN SODIUM (PORCINE) 1000 UNIT/ML DIALYSIS: 2000.0000 [IU] | INTRAMUSCULAR | Status: DC | PRN

## 2014-07-06 MED ORDER — HEPARIN (PORCINE) IN NACL 100-0.45 UNIT/ML-% IJ SOLN
1050.0000 [IU]/h | INTRAMUSCULAR | Status: DC
  Administered 2014-07-06: 1050 [IU]/h via INTRAVENOUS
  Filled 2014-07-06 (×2): qty 250

## 2014-07-06 MED ORDER — PENTAFLUOROPROP-TETRAFLUOROETH EX AERO: 1.0000 "application " | INHALATION_SPRAY | CUTANEOUS | Status: DC | PRN

## 2014-07-06 MED ORDER — NEPRO/CARBSTEADY PO LIQD
237.0000 mL | ORAL | Status: DC | PRN
  Filled 2014-07-06: qty 237

## 2014-07-06 MED ORDER — HEPARIN SODIUM (PORCINE) 1000 UNIT/ML DIALYSIS: 1000.0000 [IU] | INTRAMUSCULAR | Status: DC | PRN

## 2014-07-06 MED ORDER — LIDOCAINE HCL (PF) 1 % IJ SOLN: 5.0000 mL | INTRAMUSCULAR | Status: DC | PRN

## 2014-07-06 MED ORDER — ALTEPLASE 2 MG IJ SOLR
2.0000 mg | Freq: Once | INTRAMUSCULAR | Status: DC | PRN
  Filled 2014-07-06: qty 2

## 2014-07-06 MED ORDER — ATORVASTATIN CALCIUM 40 MG PO TABS
40.0000 mg | ORAL_TABLET | Freq: Every day | ORAL | Status: DC
  Administered 2014-07-06 – 2014-07-12 (×7): 40 mg via ORAL
  Filled 2014-07-06 (×9): qty 1

## 2014-07-06 MED ORDER — PREDNISONE 50 MG PO TABS
60.0000 mg | ORAL_TABLET | Freq: Every day | ORAL | Status: DC
  Administered 2014-07-07 – 2014-07-10 (×4): 60 mg via ORAL
  Filled 2014-07-06 (×5): qty 1

## 2014-07-06 MED ORDER — ALBUTEROL SULFATE (2.5 MG/3ML) 0.083% IN NEBU
2.5000 mg | INHALATION_SOLUTION | RESPIRATORY_TRACT | Status: DC | PRN
  Administered 2014-07-10: 2.5 mg via RESPIRATORY_TRACT
  Filled 2014-07-06: qty 3

## 2014-07-06 MED ORDER — RENA-VITE PO TABS
1.0000 | ORAL_TABLET | Freq: Every day | ORAL | Status: DC
  Administered 2014-07-06 – 2014-07-12 (×7): 1 via ORAL
  Filled 2014-07-06 (×8): qty 1

## 2014-07-06 MED ORDER — DOXYCYCLINE HYCLATE 100 MG PO TABS
100.0000 mg | ORAL_TABLET | Freq: Two times a day (BID) | ORAL | Status: DC
  Administered 2014-07-06 – 2014-07-07 (×2): 100 mg via ORAL
  Filled 2014-07-06 (×4): qty 1

## 2014-07-06 MED ORDER — SODIUM CHLORIDE 0.9 % IV SOLN: 100.0000 mL | INTRAVENOUS | Status: DC | PRN

## 2014-07-06 MED ORDER — HEPARIN BOLUS VIA INFUSION
2000.0000 [IU] | Freq: Once | INTRAVENOUS | Status: AC
  Administered 2014-07-06: 2000 [IU] via INTRAVENOUS
  Filled 2014-07-06: qty 2000

## 2014-07-06 NOTE — Consult Note (Signed)
CARDIOLOGY CONSULT NOTE   Patient ID: [name] [name] MRN: [id number], DOB/AGE: [id number]   Admit date: 07/05/2014 Date of Consult: 07/06/2014   Primary Physician: No primary provider on file. Primary Cardiologist: None (previously seen by card in 2013)  Pt. 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 Stroke   . Hypertension   . MI (myocardial infarction)   . History of blood clots   . Renal disorder   . High cholesterol     Past Surgical History  Procedure Laterality Date  . Abdominal hysterectomy       Allergies  Allergies  Allergen Reactions  . 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 According to the patient, her health declined in 2013 when she had multiple admission at [facility]. During those 3 month period, patient had an MI, an PE, and suffered acute renal insufficiency. Her reported MI back in 2013 did not require any PCI intervention, nor did she undergo any cardiac catheterization. According to the patient, she had a previous MI noted on her chemical stress test. She was briefly followed by a cardiologist, however decided not to followup with cardiology any more in 2013. She has not had any issue since that time. She has been going to a nephrologist at [facility] to monitor her renal function which appears to be stage 3/4 chronic kidney disease. She denies any significant chest discomfort with exertion.   For the past 3 month, she  has been having significant wheezing require multiple breathing treatment. She denies any significant fever or chill, however does endorse significant cough especially at night. She's been using 3 pillows at home. She has also noted increased lower extremity edema. Patient started having bilateral jaw pain or shoulder pain with intermittent chest pain starting on 06/27/2014. The pain was constant with increasing SOB. She states she has never felt so much pain before and was unable to fall asleep at night. She finally decided to seek medical attention at [facility] on 06/30/2014. She did rule in for non-ST segment elevated MI with elevated troponin. Initial troponin was 0.09 which trended up to 6.1. Cardiology was consulted at the time. Echocardiogram was obtained [facility] EF 60-65% and diastolic dysfunction. She was felt to have heart failure symptom on admission and was diuresed aggressively. She was diuresed 7 L off her first day. However subsequent diuresis resulted in elevation of her creatinine which peaked at 6.1. She was not a candidate for cardiac catheterization due to acute on chronic renal insufficiency. Patient was eventually transferred to [facility] for further renal and cardiology workup. Nephrology is planning for HD and cardiology is consulted for NSTEMI   Inpatient Medications  . amLODipine  10 mg Oral Daily  . aspirin EC  81 mg Oral Daily  . atorvastatin  40 mg Oral q1800  . cloNIDine  0.3 mg Oral QPM  . doxycycline  100 mg Oral Q12H  . heparin subcutaneous  5,000 Units Subcutaneous 3 times per day  .  ipratropium-albuterol  3 mL Nebulization QID  . isosorbide mononitrate  60 mg Oral Daily  . multivitamin  1 tablet Oral QHS  . [START ON 07/07/2014] predniSONE  60 mg Oral Q breakfast    Family History Family History  Problem Relation Age of Onset  . Heart attack Father     in his 48s  . Heart failure Mother     died of CHF 10 yrs ago     Social  History History   Social History  . Marital Status: Divorced    Spouse Name: N/A    Number of Children: N/A  . Years of Education: N/A   Occupational History  . Not on file.   Social History Main Topics  . Smoking status: Former Smoker    Quit date: 02/01/2013  . Smokeless tobacco: Not on file     Comment: started at age 26, 1-2ppd  . Alcohol Use: No  . Drug Use: No  . Sexual Activity: Not on file   Other Topics Concern  . Not on file   Social History Narrative  . No narrative on file     Review of Systems  General:  No chills, fever, night sweats or weight changes.  Cardiovascular:  +chest pain, dyspnea on exertion, edema, orthopnea. No palpitations, paroxysmal nocturnal dyspnea. Dermatological: No rash, lesions/masses Respiratory: +cough, dyspnea Urologic: No hematuria, dysuria Abdominal:   No nausea, vomiting, diarrhea, bright red blood per rectum, melena, or hematemesis Neurologic:  No visual changes, wkns, changes in mental status. All other systems reviewed and are otherwise negative except as noted above.  Physical Exam  Blood pressure 151/52, pulse 87, temperature 97.8 F (36.6 C), temperature source Oral, resp. rate 17, height 5\' 3"  (1.6 m), weight 213 lb 13.5 oz (97 kg), SpO2 97.00%.  General: Pleasant, NAD Psych: Normal affect. Neuro: Alert and oriented X 3. Moves all extremities spontaneously. HEENT: Normal  Neck: R IJ placed Lungs:  Resp regular and unlabored. markedly decreased breath sound with expiratory wheezing Heart: RRR no s3, s4. 1/6 systolic murmur at upper L sternal border Abdomen: Soft, non-tender, non-distended, BS + x 4.  Extremities: No clubbing, cyanosis. DP/PT/Radials 2+ and equal bilaterally. 2+ nonpitting edema  Labs   Recent Labs  07/05/14 1725 07/06/14 0005 07/06/14 0438  TROPONINI 0.63* 0.99* 1.80*   Lab Results  Component Value Date   WBC 10.7* 07/06/2014   HGB 8.5* 07/06/2014   HCT 27.1* 07/06/2014   MCV 92.8 07/06/2014    PLT 234 07/06/2014     Recent Labs Lab 07/05/14 1725 07/06/14 0500  NA 139 145  K 6.1* 5.7*  CL 104 107  CO2 19 19  BUN 117* 126*  CREATININE 6.41* 6.81*  CALCIUM 8.0* 8.0*  PROT 5.5*  --   BILITOT <0.2*  --   ALKPHOS 89  --   ALT 21  --   AST 20  --   GLUCOSE 108* 118*   No results found for this basename: CHOL,  HDL,  LDLCALC,  TRIG   No results found for this basename: DDIMER    Radiology/Studies  Dg Chest Port 1 View  07/05/2014   CLINICAL DATA:  Dialysis catheter placed  EXAM: PORTABLE CHEST - 1 VIEW  COMPARISON:  07/02/2014  FINDINGS: There is a right IJ dialysis catheter, tip in the upper right atrium or cavoatrial junction. No pneumothorax, new mediastinal widening, or pleural effusion.  Mild cardiomegaly which is stable from prior. Mild pulmonary hyperinflation. No edema. Patchy sclerosis  in the right humeral head which may reflect AVN.  IMPRESSION: New right IJ dialysis catheter without adverse feature.   Electronically Signed   By: Tiburcio PeaJonathan  Watts M.D.   On: 07/05/2014 23:29    ECG  NSR with nonspecific ST-T wave changes  ASSESSMENT AND PLAN  1. NSTEMI  - start IV heparin, obtain echo  - no cath now with failing kidney  - no BB to avoid bronchospasm   - cath (L +/- RHC depend on echo result) later depend on renal function  2. Acute on chronic renal insufficiency  - per nephro, pending HD today  3. COPD  - managed by [facility], on abx  4. tobacco abuse: quit last yr, smoked for close to 60 yrs 5. chronic diastolic heart failure 6. 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 Hypertension 8. history of stroke 9. Anemia - monitor  Signed, Azalee CourseMeng, Hao, PA-C 07/06/2014, 2:55 PM    The patient was seen, examined and discussed with Azalee CourseHao Meng, PA-C and I agree with the above.   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 [facility] with chest pain in the settings of CHF and NSTEMI on 06/27/2014. She had symptoms of exertional and resting SOB , orthopnea, PND. Her chest pain was radiating to the jaw and shoulders. Initial troponin was 0.09 which trended up to 6.1. Cardiology was consulted at the time. [facility] EF 60-65% and diastolic dysfunction. She was felt to have heart failure symptom on admission and was diuresed aggressively. She was diuresed 7 L off her first day.  She has a h/o baseline CKD stage III. She presented with crea 1.9 and after aggressive diuresis she developed acute on chronic kidney failure with Crea 6.8 requiring HD that will be starting today.   She was tranferred yesterday, She now has positive troponins that are uptrending. We will start Heparin infusion until we see down trend. She is currently chest pain free. Continue aspirin, atorvastatin, avoid betablockers as she is actively wheezing and has h/o bronchospasm. At this point we will repeat echocardiogram to re-evaluate for LV systolic and diastolic function as well as wall motion abnormalities. We are limited with use of contrast agent in the settings of ESRD.  Once her kidneys recovers or she will become a HD patient we will consider a cardiac catheterization. The other option is to perform a stress test once she is euvolemic. We will follow.  Lars MassonELSON, Abdikadir Fohl H 07/06/2014

## 2014-07-06 NOTE — H&P (Addendum)
PULMONARY / CRITICAL CARE MEDICINE   Name: [name] [name] MRN: [id number] DOB: November 10, 1941    ADMISSION DATE:  07/05/2014  REFERRING MD :  Transfer from [facility]  CHIEF COMPLAINT:  Shoulder and neck pain  INITIAL PRESENTATION: 73 yo smoker with COPD, diastolic CHF , IVC filter transferred from [facility] for AKI, hyperkalemia and NSTEMI.  STUDIES:   SIGNIFICANT EVENTS:  LINES: HD cath 8/2 >>>  INTERVAL HISTORY:  No acute events overnight, K improving with Kayexalate  VITAL SIGNS: Temp:  [97.6 F (36.4 C)-98 F (36.7 C)] 97.8 F (36.6 C) (08/03 1200) Pulse Rate:  [70-87] 87 (08/03 1205) Resp:  [11-20] 17 (08/03 1205) BP: (101-155)/(39-90) 151/52 mmHg (08/03 1200) SpO2:  [93 %-99 %] 97 % (08/03 1205) Weight:  [97 kg (213 lb 13.5 oz)-97.3 kg (214 lb 8.1 oz)] 97 kg (213 lb 13.5 oz) (08/03 0345)  HEMODYNAMICS:   VENTILATOR SETTINGS:   INTAKE / OUTPUT: Intake/Output     08/02 0701 - 08/03 0700 08/03 0701 - 08/04 0700   P.O. 240    I.V. (mL/kg) 90 (0.9)    IV Piggyback 50    Total Intake(mL/kg) 380 (3.9)    Urine (mL/kg/hr) 425    Stool 2    Total Output 427     Net -47          Stool Occurrence  2 x    PHYSICAL EXAMINATION: General:  No distress Neuro:  Awake, alert HEENT:  No JVD Cardiovascular:  Regular Lungs: Bilateral air entry, wheezes / rhonchi Abdomen:  Obese, soft Musculoskeletal:  Moves all extremities Skin:  Bilateral edema  LABS:  CBC  Recent Labs Lab 07/05/14 1725 07/06/14 0500  WBC 11.5* 10.7*  HGB 9.1* 8.5*  HCT 29.0* 27.1*  PLT 237 234   Coag's  Recent Labs Lab 07/05/14 1725  APTT 24  INR 1.10   BMET  Recent Labs Lab 07/05/14 1725 07/06/14 0500  NA 139 145  K 6.1* 5.7*  CL 104 107  CO2 19 19  BUN 117* 126*  CREATININE 6.41* 6.81*  GLUCOSE 108* 118*   Electrolytes  Recent Labs Lab 07/05/14 1725 07/06/14 0500  CALCIUM 8.0* 8.0*  MG 2.5  --   PHOS 7.6*  --    Sepsis Markers  Recent Labs Lab  07/05/14 1725  LATICACIDVEN 0.8  PROCALCITON 0.29   ABG No results found for this basename: PHART, PCO2ART, PO2ART,  in the last 168 hours  Liver Enzymes  Recent Labs Lab 07/05/14 1725  AST 20  ALT 21  ALKPHOS 89  BILITOT <0.2*  ALBUMIN 3.3*   Cardiac Enzymes  Recent Labs Lab 07/05/14 1725 07/06/14 0005 07/06/14 0438  TROPONINI 0.63* 0.99* 1.80*   Glucose No results found for this basename: GLUCAP,  in the last 168 hours  IMAGING:  Dg Chest Port 1 View  07/05/2014   CLINICAL DATA:  Dialysis catheter placed  EXAM: PORTABLE CHEST - 1 VIEW  COMPARISON:  07/02/2014  FINDINGS: There is a right IJ dialysis catheter, tip in the upper right atrium or cavoatrial junction. No pneumothorax, new mediastinal widening, or pleural effusion.  Mild cardiomegaly which is stable from prior. Mild pulmonary hyperinflation. No edema. Patchy sclerosis in the right humeral head which may reflect AVN.  IMPRESSION: New right IJ dialysis catheter without adverse feature.   Electronically Signed   By: Tiburcio Pea M.D.   On: 07/05/2014 23:29   ASSESSMENT / PLAN:  PULMONARY A: COPD with acute exacerbation  H/o PE  s/p IVC filter P:   Goal SpO2>92 Supplemental oxygen BD's D/c Solu-Medrol Start Prednisone 60 daily Albuterol / Atrovent  CARDIOVASCULAR A:  STEMI HTN P:  Cardiology consultation ( requested 8/3 by KZ ) ASA, Lipitor Heparin gtt? Amlodipin, Imdur, Clonidine as preadmission BB contraindicated - acute bronchospasm ACEI contraindicated - acute renal failure  RENAL A:   Acute on chronic renal failure (baseline creatine 1.6) P:   Renal following HD today Trend BMP  GASTROINTESTINAL A:   GERD Nutrition P:   Protonix Diet  HEMATOLOGIC A:   Anemia, stable VTE Px P:  Trend CBC Heparin [place]  INFECTIOUS A:   Acute bronchitis Cipro allergy P:   Simplify abx to Doxycycline   ENDOCRINE A:   No active issues P:   No intervention required  NEUROLOGIC A:    Hx of CVA without sequela Pain P:   No intervention required  SDU status. TRH to assume care 8/4.  PCCM will sign off.  I have personally obtained history, examined patient, evaluated and interpreted laboratory and imaging results, reviewed medical records, formulated assessment / plan and placed orders.  Lonia FarberZUBELEVITSKIY, Josanna Hefel, MD Pulmonary and Critical Care Medicine [facility] Pager: [phone]  07/06/2014, 1:39 PM

## 2014-07-06 NOTE — Progress Notes (Signed)
Md notified of lab results. No new orders at this time. Will continue to monitor. Karena Addison T

## 2014-07-06 NOTE — Consult Note (Signed)
ANTICOAGULATION CONSULT NOTE - Initial Consult  Pharmacy Consult for Heparin Indication: NSTEMI  Allergies  Allergen Reactions  . Ciprofloxacin     rash    Patient Measurements: Height: 5\' 3"  (160 cm) Weight: 216 lb 11.4 oz (98.3 kg) IBW/kg (Calculated) : 52.4 Heparin Dosing Weight: 75kg  Vital Signs: Temp: 97.8 F (36.6 C) (08/03 1555) Temp src: Oral (08/03 1555) BP: 131/65 mmHg (08/03 1703) Pulse Rate: 80 (08/03 1703)  Labs:  Recent Labs  07/05/14 1725 07/06/14 0005 07/06/14 0438 07/06/14 0500  HGB 9.1*  --   --  8.5*  HCT 29.0*  --   --  27.1*  PLT 237  --   --  234  APTT 24  --   --   --   LABPROT 14.2  --   --   --   INR 1.10  --   --   --   CREATININE 6.41*  --   --  6.81*  TROPONINI 0.63* 0.99* 1.80*  --     Estimated Creatinine Clearance: 8.5 ml/min (by C-G formula based on Cr of 6.81).   Medical History: Past Medical History  Diagnosis Date  . Stroke   . Hypertension   . MI (myocardial infarction)   . History of blood clots   . Renal disorder   . High cholesterol     Medications:  No anticoagulants pta  Assessment: 73yof [facility] with NSTEMI and CHF exacerbation. She developed acute renal failure 2/2 overdiuresis and was transferred to [facility] for dialysis (in dialysis now). Cardiology consulted due to increasing troponins and since she cannot go for cardiac cath due to her renal failure, she will begin IV heparin for management of her NSTEMI. She does have a history of DVT/PE with IVC filter placement and is not taking any anticoagulants pta.  Goal of Therapy:  Heparin level 0.3-0.7 units/ml Monitor platelets by anticoagulation protocol: Yes   Plan:  1) Heparin bolus 2000 units x 1 2) Heparin drip at 1050 units/hr 3) 8 hour heparin level 4) Daily heparin level and CBC  Fredrik Rigger 07/06/2014,5:07 PM

## 2014-07-06 NOTE — Progress Notes (Signed)
S:no nausea, poor appetite.  No CP.  Mild SOB O:BP 144/55  Pulse 85  Temp(Src) 98 F (36.7 C) (Oral)  Resp 16  Ht 5\' 3"  (1.6 m)  Wt 97 kg (213 lb 13.5 oz)  BMI 37.89 kg/m2  SpO2 96%  Intake/Output Summary (Last 24 hours) at 07/06/14 0921 Last data filed at 07/06/14 0600  Gross per 24 hour  Intake    380 ml  Output    427 ml  Net    -47 ml   Weight change:  ZDG:UYQIH and alert CVS:RRR Resp: scattered wheezes and upper airway sounds Abd:+ BS NTND Ext: tr edema NEURO:CNI Ox3 No asterixis Rt IJ temp HD cath   . amLODipine  10 mg Oral Daily  . aspirin EC  81 mg Oral Daily  . cefTRIAXone (ROCEPHIN)  IV  1 g Intravenous Q24H  . cloNIDine  0.3 mg Oral QPM  . heparin subcutaneous  5,000 Units Subcutaneous 3 times per day  . ipratropium-albuterol  3 mL Nebulization QID  . isosorbide mononitrate  60 mg Oral Daily  . methylPREDNISolone (SOLU-MEDROL) injection  40 mg Intravenous 3 times per day   Dg Chest Port 1 View  07/05/2014   CLINICAL DATA:  Dialysis catheter placed  EXAM: PORTABLE CHEST - 1 VIEW  COMPARISON:  07/02/2014  FINDINGS: There is a right IJ dialysis catheter, tip in the upper right atrium or cavoatrial junction. No pneumothorax, new mediastinal widening, or pleural effusion.  Mild cardiomegaly which is stable from prior. Mild pulmonary hyperinflation. No edema. Patchy sclerosis in the right humeral head which may reflect AVN.  IMPRESSION: New right IJ dialysis catheter without adverse feature.   Electronically Signed   By: Tiburcio Pea M.D.   On: 07/05/2014 23:29   BMET    Component Value Date/Time   NA 145 07/06/2014 0500   K 5.7* 07/06/2014 0500   CL 107 07/06/2014 0500   CO2 19 07/06/2014 0500   GLUCOSE 118* 07/06/2014 0500   BUN 126* 07/06/2014 0500   CREATININE 6.81* 07/06/2014 0500   CALCIUM 8.0* 07/06/2014 0500   GFRNONAA 5* 07/06/2014 0500   GFRAA 6* 07/06/2014 0500   CBC    Component Value Date/Time   WBC 10.7* 07/06/2014 0500   RBC 2.92* 07/06/2014 0500   HGB 8.5*  07/06/2014 0500   HCT 27.1* 07/06/2014 0500   PLT 234 07/06/2014 0500   MCV 92.8 07/06/2014 0500   MCH 29.1 07/06/2014 0500   MCHC 31.4 07/06/2014 0500   RDW 15.4 07/06/2014 0500   LYMPHSABS 1.2 03/22/2013 1347   MONOABS 0.3 03/22/2013 1347   EOSABS 0.7 03/22/2013 1347   BASOSABS 0.0 03/22/2013 1347     Assessment: 1. Acute on CKD 4? 2. Hyperkalemia 3. ?  NSTEMI  4. Hx HTN 5. COPD 6. anemia  Plan: 1. HD today.  She should be able to go up to HD unit 2. Time will tell if renal function will recover.   3. Will get info from her nephrologist in [place] 4. Check iron studies and PTH 5. Start renavite.  Braedan Meuth T

## 2014-07-07 DIAGNOSIS — I517 Cardiomegaly: Secondary | ICD-10-CM

## 2014-07-07 DIAGNOSIS — N390 Urinary tract infection, site not specified: Secondary | ICD-10-CM

## 2014-07-07 DIAGNOSIS — N19 Unspecified kidney failure: Secondary | ICD-10-CM

## 2014-07-07 DIAGNOSIS — I2699 Other pulmonary embolism without acute cor pulmonale: Secondary | ICD-10-CM

## 2014-07-07 LAB — RENAL FUNCTION PANEL
ALBUMIN: 3 g/dL — AB (ref 3.5–5.2)
Anion gap: 17 — ABNORMAL HIGH (ref 5–15)
BUN: 76 mg/dL — ABNORMAL HIGH (ref 6–23)
CALCIUM: 7.7 mg/dL — AB (ref 8.4–10.5)
CO2: 25 mEq/L (ref 19–32)
CREATININE: 4.24 mg/dL — AB (ref 0.50–1.10)
Chloride: 102 mEq/L (ref 96–112)
GFR calc Af Amer: 11 mL/min — ABNORMAL LOW (ref >90)
GFR, EST NON AFRICAN AMERICAN: 10 mL/min — AB (ref >90)
Glucose, Bld: 109 mg/dL — ABNORMAL HIGH (ref 70–99)
PHOSPHORUS: 7.9 mg/dL — AB (ref 2.3–4.6)
Potassium: 3.6 mEq/L — ABNORMAL LOW (ref 3.7–5.3)
Sodium: 144 mEq/L (ref 137–147)

## 2014-07-07 LAB — CBC
HCT: 24.6 % — ABNORMAL LOW (ref 36.0–46.0)
Hemoglobin: 8 g/dL — ABNORMAL LOW (ref 12.0–15.0)
MCH: 29.3 pg (ref 26.0–34.0)
MCHC: 32.5 g/dL (ref 30.0–36.0)
MCV: 90.1 fL (ref 78.0–100.0)
PLATELETS: 210 10*3/uL (ref 150–400)
RBC: 2.73 MIL/uL — ABNORMAL LOW (ref 3.87–5.11)
RDW: 14.9 % (ref 11.5–15.5)
WBC: 12.2 10*3/uL — ABNORMAL HIGH (ref 4.0–10.5)

## 2014-07-07 LAB — CORTISOL: Cortisol, Plasma: 15.9 ug/dL

## 2014-07-07 LAB — HEPARIN LEVEL (UNFRACTIONATED)
HEPARIN UNFRACTIONATED: 1.26 [IU]/mL — AB (ref 0.30–0.70)
Heparin Unfractionated: 1.14 IU/mL — ABNORMAL HIGH (ref 0.30–0.70)

## 2014-07-07 LAB — PARATHYROID HORMONE, INTACT (NO CA): PTH: 413.7 pg/mL — ABNORMAL HIGH (ref 14.0–72.0)

## 2014-07-07 MED ORDER — HEPARIN (PORCINE) IN NACL 100-0.45 UNIT/ML-% IJ SOLN
500.0000 [IU]/h | INTRAMUSCULAR | Status: DC
  Administered 2014-07-07 (×2): 500 [IU]/h via INTRAVENOUS
  Filled 2014-07-07: qty 250

## 2014-07-07 MED ORDER — SODIUM CHLORIDE 0.9 % IV SOLN
1.0000 g | Freq: Two times a day (BID) | INTRAVENOUS | Status: DC
  Administered 2014-07-07 – 2014-07-12 (×10): 1 g via INTRAVENOUS
  Filled 2014-07-07 (×11): qty 1000

## 2014-07-07 MED ORDER — CEFTRIAXONE SODIUM 1 G IJ SOLR: 1.0000 g | INTRAMUSCULAR | Status: DC

## 2014-07-07 MED ORDER — HEPARIN (PORCINE) IN NACL 100-0.45 UNIT/ML-% IJ SOLN
800.0000 [IU]/h | INTRAMUSCULAR | Status: DC
  Administered 2014-07-07: 800 [IU]/h via INTRAVENOUS
  Filled 2014-07-07: qty 250

## 2014-07-07 MED ORDER — ONDANSETRON HCL 4 MG/2ML IJ SOLN: 4.0000 mg | Freq: Four times a day (QID) | INTRAMUSCULAR | Status: DC | PRN

## 2014-07-07 NOTE — Progress Notes (Signed)
Flutter Valve initated.  Patient has very good effort.  Performed X15.

## 2014-07-07 NOTE — Progress Notes (Signed)
ANTICOAGULATION CONSULT NOTE - Initial Consult  Pharmacy Consult for Heparin Indication: NSTEMI  Allergies  Allergen Reactions  . Ciprofloxacin     rash    Patient Measurements: Height: 5\' 3"  (160 cm) Weight: 207 lb 0.2 oz (93.9 kg) IBW/kg (Calculated) : 52.4 Heparin Dosing Weight: 75kg  Vital Signs: Temp: 98.4 F (36.9 C) (08/04 1135) Temp src: Oral (08/04 0755) BP: 124/43 mmHg (08/04 1400) Pulse Rate: 80 (08/04 1300)  Labs:  Recent Labs  07/05/14 1725 07/06/14 0005 07/06/14 0438 07/06/14 0500 07/07/14 0325 07/07/14 0745 07/07/14 1400  HGB 9.1*  --   --  8.5* 8.0*  --   --   HCT 29.0*  --   --  27.1* 24.6*  --   --   PLT 237  --   --  234 210  --   --   APTT 24  --   --   --   --   --   --   LABPROT 14.2  --   --   --   --   --   --   INR 1.10  --   --   --   --   --   --   HEPARINUNFRC  --   --   --   --  1.14*  --  1.26*  CREATININE 6.41*  --   --  6.81*  --  4.24*  --   TROPONINI 0.63* 0.99* 1.80*  --   --   --   --     Estimated Creatinine Clearance: 13.3 ml/min (by C-G formula based on Cr of 4.24).   Medical History: Past Medical History  Diagnosis Date  . Stroke   . Hypertension   . MI (myocardial infarction)   . History of blood clots   . Renal disorder   . High cholesterol     \Assessment: 73yof admitted to [facility] 7/28 with NSTEMI and CHF exacerbation. She developed acute renal failure 2/2 overdiuresis and was transferred to [facility] for dialysis. Patient is on heparin for management of her NSTEMI (and noted w/ history of DVT/PE with IVC filter placement and is not taking any anticoagulants pta).  Heparin level is 1.26 and up from 1.14 earlier today.  Heparin is currently at 800 units/hr. The heparin level was drawn via central line.   Goal of Therapy:  Heparin level 0.3-0.7 units/ml Monitor platelets by anticoagulation protocol: Yes   Plan -Hold heparin for 1 hour and decrease rate to 500 units/hr -Heparin level in 8hrs  Harland German, Pharm D 07/07/2014 3:39 PM

## 2014-07-07 NOTE — Progress Notes (Signed)
ANTICOAGULATION CONSULT NOTE - Follow Up Consult  Pharmacy Consult for heparin Indication: NSTEMI  Labs:  Recent Labs  07/05/14 1725 07/06/14 0005 07/06/14 0438 07/06/14 0500 07/07/14 0325  HGB 9.1*  --   --  8.5* 8.0*  HCT 29.0*  --   --  27.1* 24.6*  PLT 237  --   --  234 210  APTT 24  --   --   --   --   LABPROT 14.2  --   --   --   --   INR 1.10  --   --   --   --   HEPARINUNFRC  --   --   --   --  1.14*  CREATININE 6.41*  --   --  6.81*  --   TROPONINI 0.63* 0.99* 1.80*  --   --     Assessment: 73yo female supratherapeutic on heparin with initial dosing for NSTEMI; RN drew correctly from opposite arm.  Goal of Therapy:  Heparin level 0.3-0.7 units/ml   Plan:  Will hold heparin gtt x60min then decrease gtt by 2-3 units/kg/hr to 800 units/hr and check level in 8hr.  Vernard Gambles, PharmD, BCPS  07/07/2014,5:16 AM

## 2014-07-07 NOTE — Progress Notes (Addendum)
  Echocardiogram 2D Echocardiogram has been performed.  [name] [name], [name] [name] 07/07/2014, 5:14 PM

## 2014-07-07 NOTE — Progress Notes (Signed)
Infectious Disease Pharmacy     Total Antibiotics Day 2                      Ceftriaxone   8/2---> 8/2          Azithromycin   8/2---> 8/2         Doxycycline 8/3>>8/4   [name] [name] is an 73 y.o. female who [facility]2014 with a chief complaint of Shoulder and neck pain.   Filed Vitals:   07/07/14 1800  BP: 121/83  Pulse:   Temp:   Resp: 17    Recent Labs Lab 07/05/14 1725 07/06/14 0500 07/07/14 0745  NA 139 145 144  K 6.1* 5.7* 3.6*  CL 104 107 102  CO2 19 19 25   GLUCOSE 108* 118* 109*  BUN 117* 126* 76*  CREATININE 6.41* 6.81* 4.24*  CALCIUM 8.0* 8.0* 7.7*  MG 2.5  --   --   PHOS 7.6*  --  7.9*    Recent Labs Lab 07/05/14 1725 07/06/14 0500 07/07/14 0325  WBC 11.5* 10.7* 12.2*    Past Medical History  Diagnosis Date  . Stroke   . Hypertension   . MI (myocardial infarction)   . History of blood clots   . Renal disorder   . High cholesterol       Recent Results (from the past 720 hour(s))  MRSA PCR SCREENING     Status: None   Collection Time    07/05/14  3:26 PM      Result Value Ref Range Status   MRSA by PCR NEGATIVE  NEGATIVE Final   Comment:            The GeneXpert MRSA Assay (FDA     approved for NASAL specimens     only), is one component of a     comprehensive MRSA colonization     surveillance program. It is not     intended to diagnose MRSA     infection nor to guide or     monitor treatment for     MRSA infections.  CULTURE, BLOOD (ROUTINE X 2)     Status: None   Collection Time    07/05/14  5:20 PM      Result Value Ref Range Status   Specimen Description BLOOD RIGHT FOREARM   Final   Special Requests BOTTLES DRAWN AEROBIC ONLY 5CC   Final   Culture  Setup Time     Final   Value: 07/06/2014 00:54     Performed at [facility]   Culture     Final   Value:        BLOOD CULTURE RECEIVED NO GROWTH TO DATE CULTURE WILL BE HELD FOR 5 DAYS BEFORE ISSUING A FINAL NEGATIVE REPORT     Performed at  [facility]   Report Status PENDING   Incomplete  CULTURE, BLOOD (ROUTINE X 2)     Status: None   Collection Time    07/05/14  5:25 PM      Result Value Ref Range Status   Specimen Description BLOOD LEFT ARM   Final   Special Requests BOTTLES DRAWN AEROBIC ONLY [facility]   Final   Culture  Setup Time     Final   Value: 07/06/2014 00:54     Performed at [facility]   Culture     Final   Value:  BLOOD CULTURE RECEIVED NO GROWTH TO DATE CULTURE WILL BE HELD FOR 5 DAYS BEFORE ISSUING A FINAL NEGATIVE REPORT     Performed at [facility]   Report Status PENDING   Incomplete  URINE CULTURE     Status: None   Collection Time    07/06/14  1:12 AM      Result Value Ref Range Status   Specimen Description URINE, RANDOM   Final   Special Requests NONE   Final   Culture  Setup Time     Final   Value: 07/06/2014 01:59     Performed at [facility] Count     Final   Value: >=100,000 COLONIES/ML     Performed at [facility]   Culture     Final   Value: ENTEROCOCCUS SPECIES     Performed at [facility]   Report Status PENDING   Incomplete    Assessment: 73 year old woman with CKD, getting intermittant dialysis now, to start on Ampicillin for enterococcal UTI, sensitivities pending.  Plan:  Ampicillin 1g IV q12h Follow up sensitivities  Celedonio Miyamoto, PharmD, BCPS [phone]

## 2014-07-07 NOTE — Progress Notes (Signed)
Subjective:  No CP/SOB  Objective:  Temp:  [97.8 F (36.6 C)-99.1 F (37.3 C)] 98.4 F (36.9 C) (08/04 1135) Pulse Rate:  [72-93] 72 (08/04 1203) Resp:  [11-20] 16 (08/04 1135) BP: (109-141)/(34-67) 134/62 mmHg (08/04 1203) SpO2:  [92 %-98 %] 97 % (08/04 1135) Weight:  [207 lb 0.2 oz (93.9 kg)-216 lb 11.4 oz (98.3 kg)] 207 lb 0.2 oz (93.9 kg) (08/04 1135) Weight change: 2 lb 3.3 oz (1 kg)  Intake/Output from previous day: 08/03 0701 - 08/04 0700 In: 273.5 [P.O.:80; I.V.:193.5] Out: 1906 [Urine:405; Stool:1]  Intake/Output from this shift: Total I/O In: 208 [P.O.:200; I.V.:8] Out: 2000 [Other:2000]  Physical Exam: General appearance: alert and no distress Neck: no adenopathy, no carotid bruit, no JVD, supple, symmetrical, trachea midline and thyroid not enlarged, symmetric, no tenderness/mass/nodules Lungs: clear to auscultation bilaterally Heart: regular rate and rhythm, S1, S2 normal, no murmur, click, rub or gallop Extremities: extremities normal, atraumatic, no cyanosis or edema  Lab Results: Results for orders placed during the hospital encounter of 07/05/14 (from the past 48 hour(s))  MRSA PCR SCREENING     Status: None   Collection Time    07/05/14  3:26 PM      Result Value Ref Range   MRSA by PCR NEGATIVE  NEGATIVE   Comment:            The GeneXpert MRSA Assay (FDA     approved for NASAL specimens     only), is one component of a     comprehensive MRSA colonization     surveillance program. It is not     intended to diagnose MRSA     infection nor to guide or     monitor treatment for     MRSA infections.  CULTURE, BLOOD (ROUTINE X 2)     Status: None   Collection Time    07/05/14  5:20 PM      Result Value Ref Range   Specimen Description BLOOD RIGHT FOREARM     Special Requests BOTTLES DRAWN AEROBIC ONLY 5CC     Culture  Setup Time       Value: 07/06/2014 00:54     Performed at [facility]   Culture       Value:        BLOOD  CULTURE RECEIVED NO GROWTH TO DATE CULTURE WILL BE HELD FOR 5 DAYS BEFORE ISSUING A FINAL NEGATIVE REPORT     Performed at [facility]   Report Status PENDING    COMPREHENSIVE METABOLIC PANEL     Status: Abnormal   Collection Time    07/05/14  5:25 PM      Result Value Ref Range   Sodium 139  137 - 147 mEq/L   Potassium 6.1 (*) 3.7 - 5.3 mEq/L   Chloride 104  96 - 112 mEq/L   CO2 19  19 - 32 mEq/L   Glucose, Bld 108 (*) 70 - 99 mg/dL   BUN 117 (*) 6 - 23 mg/dL   Creatinine, Ser 6.41 (*) 0.50 - 1.10 mg/dL   Calcium 8.0 (*) 8.4 - 10.5 mg/dL   Total Protein 5.5 (*) 6.0 - 8.3 g/dL   Albumin 3.3 (*) 3.5 - 5.2 g/dL   AST 20  0 - 37 U/L   ALT 21  0 - 35 U/L   Alkaline Phosphatase 89  39 - 117 U/L   Total Bilirubin <0.2 (*) 0.3 - 1.2 mg/dL  GFR calc non Af Amer 6 (*) >90 mL/min   GFR calc Af Amer 7 (*) >90 mL/min   Comment: (NOTE)     The eGFR has been calculated using the CKD EPI equation.     This calculation has not been validated in all clinical situations.     eGFR's persistently <90 mL/min signify possible Chronic Kidney     Disease.   Anion gap 16 (*) 5 - 15  MAGNESIUM     Status: None   Collection Time    07/05/14  5:25 PM      Result Value Ref Range   Magnesium 2.5  1.5 - 2.5 mg/dL  PHOSPHORUS     Status: Abnormal   Collection Time    07/05/14  5:25 PM      Result Value Ref Range   Phosphorus 7.6 (*) 2.3 - 4.6 mg/dL  LACTIC ACID, PLASMA     Status: None   Collection Time    07/05/14  5:25 PM      Result Value Ref Range   Lactic Acid, Venous 0.8  0.5 - 2.2 mmol/L  CORTISOL     Status: None   Collection Time    07/05/14  5:25 PM      Result Value Ref Range   Cortisol, Plasma 15.9     Comment: (NOTE)     AM:  4.3 - 22.4 ug/dL     PM:  3.1 - 16.7 ug/dL     Performed at [facility]  CBC     Status: Abnormal   Collection Time    07/05/14  5:25 PM      Result Value Ref Range   WBC 11.5 (*) 4.0 - 10.5 K/uL   RBC 3.11 (*) 3.87 - 5.11 MIL/uL    Hemoglobin 9.1 (*) 12.0 - 15.0 g/dL   HCT 29.0 (*) 36.0 - 46.0 %   MCV 93.2  78.0 - 100.0 fL   MCH 29.3  26.0 - 34.0 pg   MCHC 31.4  30.0 - 36.0 g/dL   RDW 15.6 (*) 11.5 - 15.5 %   Platelets 237  150 - 400 K/uL  PROTIME-INR     Status: None   Collection Time    07/05/14  5:25 PM      Result Value Ref Range   Prothrombin Time 14.2  11.6 - 15.2 seconds   INR 1.10  0.00 - 1.49  APTT     Status: None   Collection Time    07/05/14  5:25 PM      Result Value Ref Range   aPTT 24  24 - 37 seconds  CULTURE, BLOOD (ROUTINE X 2)     Status: None   Collection Time    07/05/14  5:25 PM      Result Value Ref Range   Specimen Description BLOOD LEFT ARM     Special Requests BOTTLES DRAWN AEROBIC ONLY 6CC     Culture  Setup Time       Value: 07/06/2014 00:54     Performed at [facility]   Culture       Value:        BLOOD CULTURE RECEIVED NO GROWTH TO DATE CULTURE WILL BE HELD FOR 5 DAYS BEFORE ISSUING A FINAL NEGATIVE REPORT     Performed at [facility]   Report Status PENDING    TROPONIN I     Status: Abnormal   Collection Time    07/05/14  5:25 PM      Result Value Ref Range   Troponin I 0.63 (*) <0.30 ng/mL   Comment:            Due to the release kinetics of cTnI,     a negative result within the first hours     of the onset of symptoms does not rule out     myocardial infarction with certainty.     If myocardial infarction is still suspected,     repeat the test at appropriate intervals.     CRITICAL RESULT CALLED TO, READ BACK BY AND VERIFIED WITH:     M.LESSARD,RN 1852 07/05/14 M.CAMPBELL  PROCALCITONIN     Status: None   Collection Time    07/05/14  5:25 PM      Result Value Ref Range   Procalcitonin 0.29     Comment:            Interpretation:     PCT (Procalcitonin) <= 0.5 ng/mL:     Systemic infection (sepsis) is not likely.     Local bacterial infection is possible.     (NOTE)             ICU PCT Algorithm               Non ICU PCT Algorithm         ----------------------------     ------------------------------             PCT < 0.25 ng/mL                 PCT < 0.1 ng/mL         Stopping of antibiotics            Stopping of antibiotics           strongly encouraged.               strongly encouraged.        ----------------------------     ------------------------------           PCT level decrease by               PCT < 0.25 ng/mL           >= 80% from peak PCT           OR PCT 0.25 - 0.5 ng/mL          Stopping of antibiotics                                                 encouraged.         Stopping of antibiotics               encouraged.        ----------------------------     ------------------------------           PCT level decrease by              PCT >= 0.25 ng/mL           < 80% from peak PCT            AND PCT >= 0.5 ng/mL            Continuing antibiotics  encouraged.           Continuing antibiotics                encouraged.        ----------------------------     ------------------------------         PCT level increase compared          PCT > 0.5 ng/mL             with peak PCT AND              PCT >= 0.5 ng/mL             Escalation of antibiotics                                              strongly encouraged.          Escalation of antibiotics            strongly encouraged.  POCT I-STAT 3, ART BLOOD GAS (G3+)     Status: Abnormal   Collection Time    07/05/14  5:38 PM      Result Value Ref Range   pH, Arterial 7.307 (*) 7.350 - 7.450   pCO2 arterial 40.5  35.0 - 45.0 mmHg   pO2, Arterial 94.0  80.0 - 100.0 mmHg   Bicarbonate 20.3  20.0 - 24.0 mEq/L   TCO2 21  0 - 100 mmol/L   O2 Saturation 97.0     Acid-base deficit 6.0 (*) 0.0 - 2.0 mmol/L   Collection site RADIAL, ALLEN'S TEST ACCEPTABLE     Drawn by RT     Sample type ARTERIAL    TROPONIN I     Status: Abnormal   Collection Time    07/06/14 12:05 AM      Result Value Ref Range   Troponin I  0.99 (*) <0.30 ng/mL   Comment:            Due to the release kinetics of cTnI,     a negative result within the first hours     of the onset of symptoms does not rule out     myocardial infarction with certainty.     If myocardial infarction is still suspected,     repeat the test at appropriate intervals.     CRITICAL VALUE NOTED.  VALUE IS CONSISTENT WITH PREVIOUSLY REPORTED AND CALLED VALUE.  HEPATITIS B SURFACE ANTIGEN     Status: None   Collection Time    07/06/14 12:05 AM      Result Value Ref Range   Hepatitis B Surface Ag NEGATIVE  NEGATIVE   Comment: Performed at [facility] ANTIBODY     Status: None   Collection Time    07/06/14 12:05 AM      Result Value Ref Range   Hep B S Ab NEGATIVE  NEGATIVE   Comment: Performed at [place], IGM     Status: None   Collection Time    07/06/14 12:05 AM      Result Value Ref Range   Hep B C IgM NON REACTIVE  NON REACTIVE   Comment: (NOTE)     High levels of Hepatitis B Core IgM antibody are detectable     during the acute stage of Hepatitis  B. This antibody is used     to differentiate current from past HBV infection.     Performed at [facility]     Status: None   Collection Time    07/06/14  1:12 AM      Result Value Ref Range   Specimen Description URINE, RANDOM     Special Requests NONE     Culture  Setup Time       Value: 07/06/2014 01:59     Performed at [facility] Count       Value: >=100,000 COLONIES/ML     Performed at [facility]   Culture       Value: ENTEROCOCCUS SPECIES     Performed at [facility]   Report Status PENDING    STREP PNEUMONIAE URINARY ANTIGEN     Status: None   Collection Time    07/06/14  1:12 AM      Result Value Ref Range   Strep Pneumo Urinary Antigen NEGATIVE  NEGATIVE   Comment:            Infection due to S. pneumoniae     cannot be absolutely ruled out       since the antigen present     may be below the detection limit     of the test.  LEGIONELLA ANTIGEN, URINE     Status: None   Collection Time    07/06/14  1:12 AM      Result Value Ref Range   Specimen Description URINE, RANDOM     Special Requests NONE     Legionella Antigen, Urine       Value: Negative for Legionella pneumophilia serogroup 1     Performed at [facility]   Report Status 07/06/2014 FINAL    URINALYSIS, ROUTINE W REFLEX MICROSCOPIC     Status: Abnormal   Collection Time    07/06/14  1:12 AM      Result Value Ref Range   Color, Urine YELLOW  YELLOW   APPearance CLOUDY (*) CLEAR   Specific Gravity, Urine 1.019  1.005 - 1.030   pH 5.0  5.0 - 8.0   Glucose, UA NEGATIVE  NEGATIVE mg/dL   Hgb urine dipstick NEGATIVE  NEGATIVE   Bilirubin Urine NEGATIVE  NEGATIVE   Ketones, ur NEGATIVE  NEGATIVE mg/dL   Protein, ur NEGATIVE  NEGATIVE mg/dL   Urobilinogen, UA 0.2  0.0 - 1.0 mg/dL   Nitrite NEGATIVE  NEGATIVE   Leukocytes, UA MODERATE (*) NEGATIVE  SODIUM, URINE, RANDOM     Status: None   Collection Time    07/06/14  1:12 AM      Result Value Ref Range   Sodium, Ur <20    CREATININE, URINE, RANDOM     Status: None   Collection Time    07/06/14  1:12 AM      Result Value Ref Range   Creatinine, Urine 104.56    URINE MICROSCOPIC-ADD ON     Status: Abnormal   Collection Time    07/06/14  1:12 AM      Result Value Ref Range   Squamous Epithelial / LPF RARE  RARE   WBC, UA 3-6  <3 WBC/hpf   RBC / HPF 0-2  <3 RBC/hpf   Bacteria, UA FEW (*) RARE   Casts HYALINE CASTS (*) NEGATIVE   Urine-Other RARE YEAST    TROPONIN I  Status: Abnormal   Collection Time    07/06/14  4:38 AM      Result Value Ref Range   Troponin I 1.80 (*) <0.30 ng/mL   Comment:            Due to the release kinetics of cTnI,     a negative result within the first hours     of the onset of symptoms does not rule out     myocardial infarction with certainty.     If myocardial  infarction is still suspected,     repeat the test at appropriate intervals.     CRITICAL VALUE NOTED.  VALUE IS CONSISTENT WITH PREVIOUSLY REPORTED AND CALLED VALUE.  CBC     Status: Abnormal   Collection Time    07/06/14  5:00 AM      Result Value Ref Range   WBC 10.7 (*) 4.0 - 10.5 K/uL   RBC 2.92 (*) 3.87 - 5.11 MIL/uL   Hemoglobin 8.5 (*) 12.0 - 15.0 g/dL   HCT 27.1 (*) 36.0 - 46.0 %   MCV 92.8  78.0 - 100.0 fL   MCH 29.1  26.0 - 34.0 pg   MCHC 31.4  30.0 - 36.0 g/dL   RDW 15.4  11.5 - 15.5 %   Platelets 234  150 - 400 K/uL  BASIC METABOLIC PANEL     Status: Abnormal   Collection Time    07/06/14  5:00 AM      Result Value Ref Range   Sodium 145  137 - 147 mEq/L   Potassium 5.7 (*) 3.7 - 5.3 mEq/L   Chloride 107  96 - 112 mEq/L   CO2 19  19 - 32 mEq/L   Glucose, Bld 118 (*) 70 - 99 mg/dL   BUN 126 (*) 6 - 23 mg/dL   Creatinine, Ser 6.81 (*) 0.50 - 1.10 mg/dL   Calcium 8.0 (*) 8.4 - 10.5 mg/dL   GFR calc non Af Amer 5 (*) >90 mL/min   GFR calc Af Amer 6 (*) >90 mL/min   Comment: (NOTE)     The eGFR has been calculated using the CKD EPI equation.     This calculation has not been validated in all clinical situations.     eGFR's persistently <90 mL/min signify possible Chronic Kidney     Disease.   Anion gap 19 (*) 5 - 15  IRON AND TIBC     Status: Abnormal   Collection Time    07/06/14 10:00 AM      Result Value Ref Range   Iron 190 (*) 42 - 135 ug/dL   TIBC 301  250 - 470 ug/dL   Saturation Ratios 63 (*) 20 - 55 %   UIBC 111 (*) 125 - 400 ug/dL   Comment: Performed at [facility]     Status: None   Collection Time    07/06/14 10:00 AM      Result Value Ref Range   Ferritin 36  10 - 291 ng/mL   Comment: Performed at [place], INTACT (NO CA)     Status: Abnormal   Collection Time    07/06/14 10:00 AM      Result Value Ref Range   PTH 413.7 (*) 14.0 - 72.0 pg/mL   Comment: Performed at [facility] (UNFRACTIONATED)     Status: Abnormal   Collection Time    07/07/14  3:25 AM      Result Value Ref Range   Heparin Unfractionated 1.14 (*) 0.30 - 0.70 IU/mL   Comment:            IF HEPARIN RESULTS ARE BELOW     EXPECTED VALUES, AND PATIENT     DOSAGE HAS BEEN CONFIRMED,     SUGGEST FOLLOW UP TESTING     OF ANTITHROMBIN III LEVELS.     RESULTS CONFIRMED BY MANUAL DILUTION  CBC     Status: Abnormal   Collection Time    07/07/14  3:25 AM      Result Value Ref Range   WBC 12.2 (*) 4.0 - 10.5 K/uL   RBC 2.73 (*) 3.87 - 5.11 MIL/uL   Hemoglobin 8.0 (*) 12.0 - 15.0 g/dL   HCT 24.6 (*) 36.0 - 46.0 %   MCV 90.1  78.0 - 100.0 fL   MCH 29.3  26.0 - 34.0 pg   MCHC 32.5  30.0 - 36.0 g/dL   RDW 14.9  11.5 - 15.5 %   Platelets 210  150 - 400 K/uL  RENAL FUNCTION PANEL     Status: Abnormal   Collection Time    07/07/14  7:45 AM      Result Value Ref Range   Sodium 144  137 - 147 mEq/L   Potassium 3.6 (*) 3.7 - 5.3 mEq/L   Chloride 102  96 - 112 mEq/L   CO2 25  19 - 32 mEq/L   Glucose, Bld 109 (*) 70 - 99 mg/dL   BUN 76 (*) 6 - 23 mg/dL   Comment: DELTA CHECK NOTED   Creatinine, Ser 4.24 (*) 0.50 - 1.10 mg/dL   Comment: DELTA CHECK NOTED   Calcium 7.7 (*) 8.4 - 10.5 mg/dL   Phosphorus 7.9 (*) 2.3 - 4.6 mg/dL   Albumin 3.0 (*) 3.5 - 5.2 g/dL   GFR calc non Af Amer 10 (*) >90 mL/min   GFR calc Af Amer 11 (*) >90 mL/min   Comment: (NOTE)     The eGFR has been calculated using the CKD EPI equation.     This calculation has not been validated in all clinical situations.     eGFR's persistently <90 mL/min signify possible Chronic Kidney     Disease.   Anion gap 17 (*) 5 - 15    Imaging: Imaging results have been reviewed  Assessment/Plan:   1. Active Problems: 2.   COPD with acute exacerbation 3.   History of DVT (deep vein thrombosis) 4.   STEMI (ST elevation myocardial infarction) 5.   Acute renal failure 6.   Tobacco abuse 7.   HTN (hypertension) 8.   CHF  (congestive heart failure) 9.   GERD (gastroesophageal reflux disease) 10.   Gout 11.   S/P IVC filter 12.   CVA (cerebral vascular accident) 35.   NSTEMI (non-ST elevated myocardial infarction) 14.   Acute on chronic diastolic CHF (congestive heart failure), NYHA class 3 15.   Time Spent Directly with Patient:  20 minutes  Length of Stay:  LOS: 2 days   Pt admitted in Tx for NSTEMI. Was aggressively diuresed at outside hospital. SCr>6. Getting HD today. EF at outside hosp nl. On IV hep. No CP. Will ultimately need cor angio but need to see where SCr ends up.  Lorretta Harp 07/07/2014, 1:53 PM

## 2014-07-07 NOTE — Progress Notes (Signed)
S:Breathing better.  No N/V O:BP 124/34  Pulse 72  Temp(Src) 98.2 F (36.8 C) (Oral)  Resp 12  Ht 5\' 3"  (1.6 m)  Wt 95.3 kg (210 lb 1.6 oz)  BMI 37.23 kg/m2  SpO2 95%  Intake/Output Summary (Last 24 hours) at 07/07/14 0738 Last data filed at 07/07/14 0700  Gross per 24 hour  Intake 273.46 ml  Output   1906 ml  Net -1632.54 ml   Weight change: 1 kg (2 lb 3.3 oz) OYD:XAJOI and alert CVS:RRR Resp: Basilar crackles and occasional wheeze Abd:+ BS NTND Ext: 0-tr edema NEURO:CNI Ox3 No asterixis Rt IJ temp HD cath   . amLODipine  10 mg Oral Daily  . aspirin EC  81 mg Oral Daily  . atorvastatin  40 mg Oral q1800  . cloNIDine  0.3 mg Oral QPM  . doxycycline  100 mg Oral Q12H  . ipratropium-albuterol  3 mL Nebulization QID  . isosorbide mononitrate  60 mg Oral Daily  . multivitamin  1 tablet Oral QHS  . predniSONE  60 mg Oral Q breakfast   Dg Chest Port 1 View  07/05/2014   CLINICAL DATA:  Dialysis catheter placed  EXAM: PORTABLE CHEST - 1 VIEW  COMPARISON:  07/02/2014  FINDINGS: There is a right IJ dialysis catheter, tip in the upper right atrium or cavoatrial junction. No pneumothorax, new mediastinal widening, or pleural effusion.  Mild cardiomegaly which is stable from prior. Mild pulmonary hyperinflation. No edema. Patchy sclerosis in the right humeral head which may reflect AVN.  IMPRESSION: New right IJ dialysis catheter without adverse feature.   Electronically Signed   By: Tiburcio Pea M.D.   On: 07/05/2014 23:29   BMET    Component Value Date/Time   NA 145 07/06/2014 0500   K 5.7* 07/06/2014 0500   CL 107 07/06/2014 0500   CO2 19 07/06/2014 0500   GLUCOSE 118* 07/06/2014 0500   BUN 126* 07/06/2014 0500   CREATININE 6.81* 07/06/2014 0500   CALCIUM 8.0* 07/06/2014 0500   GFRNONAA 5* 07/06/2014 0500   GFRAA 6* 07/06/2014 0500   CBC    Component Value Date/Time   WBC 12.2* 07/07/2014 0325   RBC 2.73* 07/07/2014 0325   HGB 8.0* 07/07/2014 0325   HCT 24.6* 07/07/2014 0325   PLT 210  07/07/2014 0325   MCV 90.1 07/07/2014 0325   MCH 29.3 07/07/2014 0325   MCHC 32.5 07/07/2014 0325   RDW 14.9 07/07/2014 0325   LYMPHSABS 1.2 03/22/2013 1347   MONOABS 0.3 03/22/2013 1347   EOSABS 0.7 03/22/2013 1347   BASOSABS 0.0 03/22/2013 1347     Assessment: 1. Acute on CKD 3/4  (baseline Scr around 2) 2. Hyperkalemia 3. NSTEMI  4. Hx HTN 5. COPD 6. anemia  Plan: 1. Will plan HD again today then will reassess the need for more.  I am somewhat optimistic that renal fx will recover. 2. Daily Scr 3. Renal diet  Cale Bethard T

## 2014-07-07 NOTE — Progress Notes (Addendum)
[place] TEAM 1 - Stepdown/ICU TEAM Progress Note  [name] [name] XIP:[id number] DOB: [birth year] DOA: 07/05/2014 PCP: No primary provider on file.  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, hypertension, Hx CVA  presented with NSTEMI and acute on chronic renal insufficiency   HPI/Subjective: 8/4 negative CP/SOB  Assessment/Plan: COPD with acute exacerbation  -Continue albuterol nebulizer -Continue doxycycline 100 mg BID -Continue prednisone 60 mg daily -Flutter valve q 4hr when awake  Hx DVT/PE  - S/P Hx IVC filter placement -Heparin drip  STEMI (ST elevation myocardial infarction) NYHA class 3 -Per cardiology will need cardiac cath after acute renal failure resolves  Acute on chronic diastolic CHF  -See HTN -Echocardiogram pending   HTN (hypertension)  -Amlodipine 10 mg daily -Clonidine 0.3 mg daily -Imdur 60 mg daily  Acute renal failure  -HD per nephrology  UTI -Enterococcus awaiting sensitivities  -Ampicillin per pharmacy  Tobacco abuse  -Discussed abstinence  Hx CVA  -Neurologically intact   Code Status: FULL Family Communication: no family present at time of exam Disposition Plan: Per cardiology/nephrology    Consultants: Dr. York Ram (cardiology) Dr. Primitivo Gauze (nephrology) Dr. Evalyn Casco (PCCM)   Procedure/Significant Events: Echocardiogram pending   Culture 8/2 blood right forearm/left arm NGTD 8/2 MRSA by PCR negative 8/3 urine positive enterococcus     Antibiotics: Doxycycline 8/3>> stopped 8/4 Ampicillin 8./4>>   DVT prophylaxis: Heparin drip   Devices IVC filter   LINES / TUBES:      Continuous Infusions: . sodium chloride 10 mL (07/05/14 2200)  . heparin 800 Units/hr (07/07/14 6734)    Objective: VITAL SIGNS: Temp: 98.4 F (36.9 C) (08/04 1135) Temp src: Oral (08/04 0755) BP: 134/62 mmHg  (08/04 1203) Pulse Rate: 72 (08/04 1203) SPO2; FIO2:   Intake/Output Summary (Last 24 hours) at 07/07/14 1307 Last data filed at 07/07/14 1135  Gross per 24 hour  Intake 421.46 ml  Output   3906 ml  Net -3484.54 ml     Exam: General: A./O. x4, NAD, No acute respiratory distress Lungs: Clear to auscultation bilaterally without wheezes or crackles Cardiovascular: Regular rate and rhythm without murmur gallop or rub normal S1 and S2 Abdomen: Nontender, nondistended, soft, bowel sounds positive, no rebound, no ascites, no appreciable mass Extremities: No significant cyanosis, clubbing, or edema bilateral lower extremities  Data Reviewed: Basic Metabolic Panel:  Recent Labs Lab 07/05/14 1725 07/06/14 0500 07/07/14 0745  NA 139 145 144  K 6.1* 5.7* 3.6*  CL 104 107 102  CO2 19 19 25   GLUCOSE 108* 118* 109*  BUN 117* 126* 76*  CREATININE 6.41* 6.81* 4.24*  CALCIUM 8.0* 8.0* 7.7*  MG 2.5  --   --   PHOS 7.6*  --  7.9*   Liver Function Tests:  Recent Labs Lab 07/05/14 1725 07/07/14 0745  AST 20  --   ALT 21  --   ALKPHOS 89  --   BILITOT <0.2*  --   PROT 5.5*  --   ALBUMIN 3.3* 3.0*   No results found for this basename: LIPASE, AMYLASE,  in the last 168 hours No results found for this basename: AMMONIA,  in the last 168 hours CBC:  Recent Labs Lab 07/05/14 1725 07/06/14 0500 07/07/14 0325  WBC 11.5* 10.7* 12.2*  HGB 9.1* 8.5* 8.0*  HCT 29.0* 27.1* 24.6*  MCV 93.2 92.8 90.1  PLT 237 234 210   Cardiac  Enzymes:  Recent Labs Lab 07/05/14 1725 07/06/14 0005 07/06/14 0438  TROPONINI 0.63* 0.99* 1.80*   BNP (last 3 results) No results found for this basename: PROBNP,  in the last 8760 hours CBG: No results found for this basename: GLUCAP,  in the last 168 hours  Recent Results (from the past 240 hour(s))  MRSA PCR SCREENING     Status: None   Collection Time    07/05/14  3:26 PM      Result Value Ref Range Status   MRSA by PCR NEGATIVE  NEGATIVE  Final   Comment:            The GeneXpert MRSA Assay (FDA     approved for NASAL specimens     only), is one component of a     comprehensive MRSA colonization     surveillance program. It is not     intended to diagnose MRSA     infection nor to guide or     monitor treatment for     MRSA infections.  CULTURE, BLOOD (ROUTINE X 2)     Status: None   Collection Time    07/05/14  5:20 PM      Result Value Ref Range Status   Specimen Description BLOOD RIGHT FOREARM   Final   Special Requests BOTTLES DRAWN AEROBIC ONLY 5CC   Final   Culture  Setup Time     Final   Value: 07/06/2014 00:54     Performed at [facility]   Culture     Final   Value:        BLOOD CULTURE RECEIVED NO GROWTH TO DATE CULTURE WILL BE HELD FOR 5 DAYS BEFORE ISSUING A FINAL NEGATIVE REPORT     Performed at [facility]   Report Status PENDING   Incomplete  CULTURE, BLOOD (ROUTINE X 2)     Status: None   Collection Time    07/05/14  5:25 PM      Result Value Ref Range Status   Specimen Description BLOOD LEFT ARM   Final   Special Requests BOTTLES DRAWN AEROBIC ONLY 6CC   Final   Culture  Setup Time     Final   Value: 07/06/2014 00:54     Performed at [facility]   Culture     Final   Value:        BLOOD CULTURE RECEIVED NO GROWTH TO DATE CULTURE WILL BE HELD FOR 5 DAYS BEFORE ISSUING A FINAL NEGATIVE REPORT     Performed at [facility]   Report Status PENDING   Incomplete  URINE CULTURE     Status: None   Collection Time    07/06/14  1:12 AM      Result Value Ref Range Status   Specimen Description URINE, RANDOM   Final   Special Requests NONE   Final   Culture  Setup Time     Final   Value: 07/06/2014 01:59     Performed at [facility] Count     Final   Value: >=100,000 COLONIES/ML     Performed at [facility]   Culture     Final   Value: ENTEROCOCCUS SPECIES     Performed at [facility]   Report Status PENDING   Incomplete       Studies:  Recent x-ray studies have been reviewed in detail by the Attending Physician  Scheduled Meds:  Scheduled Meds: . amLODipine  10 mg Oral Daily  . aspirin EC  81 mg Oral Daily  . atorvastatin  40 mg Oral q1800  . cloNIDine  0.3 mg Oral QPM  . doxycycline  100 mg Oral Q12H  . ipratropium-albuterol  3 mL Nebulization QID  . isosorbide mononitrate  60 mg Oral Daily  . multivitamin  1 tablet Oral QHS  . predniSONE  60 mg Oral Q breakfast    Time spent on care of this patient: 40 mins   Drema DallasWOODS, CURTIS, J , MD   [facility] Office  [phone] Pager [phone]  On-Call/Text Page:      [email or web address]      password [email or web address]  If 7PM-7AM, please contact night-coverage [email or web address] Password [email or web address] 07/07/2014, 1:07 PM   LOS: 2 days

## 2014-07-08 ENCOUNTER — Encounter (HOSPITAL_COMMUNITY): Payer: Self-pay | Admitting: *Deleted

## 2014-07-08 LAB — PREPARE RBC (CROSSMATCH)

## 2014-07-08 LAB — RENAL FUNCTION PANEL
ALBUMIN: 2.9 g/dL — AB (ref 3.5–5.2)
ANION GAP: 16 — AB (ref 5–15)
BUN: 63 mg/dL — ABNORMAL HIGH (ref 6–23)
CHLORIDE: 100 meq/L (ref 96–112)
CO2: 26 meq/L (ref 19–32)
CREATININE: 3.66 mg/dL — AB (ref 0.50–1.10)
Calcium: 8 mg/dL — ABNORMAL LOW (ref 8.4–10.5)
GFR calc Af Amer: 13 mL/min — ABNORMAL LOW (ref >90)
GFR calc non Af Amer: 12 mL/min — ABNORMAL LOW (ref >90)
Glucose, Bld: 109 mg/dL — ABNORMAL HIGH (ref 70–99)
POTASSIUM: 3.7 meq/L (ref 3.7–5.3)
Phosphorus: 7.4 mg/dL — ABNORMAL HIGH (ref 2.3–4.6)
Sodium: 142 mEq/L (ref 137–147)

## 2014-07-08 LAB — URINE CULTURE

## 2014-07-08 LAB — CBC
HCT: 23.4 % — ABNORMAL LOW (ref 36.0–46.0)
Hemoglobin: 7.6 g/dL — ABNORMAL LOW (ref 12.0–15.0)
MCH: 29.7 pg (ref 26.0–34.0)
MCHC: 32.5 g/dL (ref 30.0–36.0)
MCV: 91.4 fL (ref 78.0–100.0)
Platelets: 181 10*3/uL (ref 150–400)
RBC: 2.56 MIL/uL — AB (ref 3.87–5.11)
RDW: 14.7 % (ref 11.5–15.5)
WBC: 11.3 10*3/uL — AB (ref 4.0–10.5)

## 2014-07-08 LAB — ABO/RH: ABO/RH(D): O NEG

## 2014-07-08 LAB — HEPARIN LEVEL (UNFRACTIONATED): HEPARIN UNFRACTIONATED: 0.52 [IU]/mL (ref 0.30–0.70)

## 2014-07-08 MED ORDER — IPRATROPIUM-ALBUTEROL 0.5-2.5 (3) MG/3ML IN SOLN
3.0000 mL | Freq: Three times a day (TID) | RESPIRATORY_TRACT | Status: DC
  Administered 2014-07-08 – 2014-07-13 (×10): 3 mL via RESPIRATORY_TRACT
  Filled 2014-07-08 (×12): qty 3

## 2014-07-08 MED ORDER — METOPROLOL TARTRATE 12.5 MG HALF TABLET
12.5000 mg | ORAL_TABLET | Freq: Two times a day (BID) | ORAL | Status: DC
  Administered 2014-07-08 – 2014-07-10 (×5): 12.5 mg via ORAL
  Filled 2014-07-08 (×7): qty 1

## 2014-07-08 MED ORDER — SODIUM CHLORIDE 0.9 % IV SOLN
Freq: Once | INTRAVENOUS | Status: AC
  Administered 2014-07-08: 15:00:00 via INTRAVENOUS

## 2014-07-08 MED ORDER — AMLODIPINE BESYLATE 5 MG PO TABS
5.0000 mg | ORAL_TABLET | Freq: Every day | ORAL | Status: DC
  Administered 2014-07-08 – 2014-07-10 (×3): 5 mg via ORAL
  Filled 2014-07-08 (×4): qty 1

## 2014-07-08 MED ORDER — FUROSEMIDE 10 MG/ML IJ SOLN
160.0000 mg | Freq: Three times a day (TID) | INTRAVENOUS | Status: DC
  Administered 2014-07-08 – 2014-07-09 (×2): 160 mg via INTRAVENOUS
  Filled 2014-07-08 (×5): qty 16

## 2014-07-08 NOTE — Progress Notes (Signed)
DAILY PROGRESS NOTE  Subjective:  No events overnight. Troponin peaked at 1.8. Echo shows preserved systolic function.  Objective:  Temp:  [97.2 F (36.2 C)-98.3 F (36.8 C)] 97.2 F (36.2 C) (08/05 1300) Pulse Rate:  [63-103] 103 (08/05 1300) Resp:  [9-20] 16 (08/05 1300) BP: (106-156)/(31-83) 156/71 mmHg (08/05 1300) SpO2:  [94 %-99 %] 97 % (08/05 1300) Weight:  [206 lb 8 oz (93.668 kg)] 206 lb 8 oz (93.668 kg) (08/05 0500) Weight change: -5 lb 1.1 oz (-2.3 kg)  Intake/Output from previous day: 08/04 0701 - 08/05 0700 In: 1120 [P.O.:960; I.V.:110; IV Piggyback:50] Out: 2255 [Urine:255]  Intake/Output from this shift: Total I/O In: 312.5 [P.O.:240; I.V.:10; Blood:12.5; IV Piggyback:50] Out: -   Medications: Current Facility-Administered Medications  Medication Dose Route Frequency Provider Last Rate Last Dose  . 0.9 %  sodium chloride infusion   Intravenous Continuous Grace Bushy Minor, NP 10 mL/hr at 07/05/14 2200 10 mL at 07/05/14 2200  . 0.9 %  sodium chloride infusion   Intravenous Once Cherene Altes, MD      . albuterol (PROVENTIL) (2.5 MG/3ML) 0.083% nebulizer solution 2.5 mg  2.5 mg Nebulization Q2H PRN Doree Fudge, MD      . amLODipine (NORVASC) tablet 5 mg  5 mg Oral Daily Windy Kalata, MD   5 mg at 07/08/14 1034  . ampicillin (OMNIPEN) 1 g in sodium chloride 0.9 % 50 mL IVPB  1 g Intravenous Q12H Allie Bossier, MD   1 g at 07/08/14 0758  . aspirin EC tablet 81 mg  81 mg Oral Daily Grace Bushy Minor, NP   81 mg at 07/07/14 1614  . atorvastatin (LIPITOR) tablet 40 mg  40 mg Oral q1800 Doree Fudge, MD   40 mg at 07/07/14 1732  . furosemide (LASIX) 160 mg in dextrose 5 % 50 mL IVPB  160 mg Intravenous 3 times per day Windy Kalata, MD      . ipratropium-albuterol (DUONEB) 0.5-2.5 (3) MG/3ML nebulizer solution 3 mL  3 mL Nebulization TID Cherene Altes, MD      . isosorbide mononitrate (IMDUR) 24 hr tablet 60 mg  60 mg  Oral Daily Kara Mead V, MD   60 mg at 07/08/14 1034  . morphine 2 MG/ML injection 2-4 mg  2-4 mg Intravenous Q3H PRN Grace Bushy Minor, NP   2 mg at 07/07/14 1614  . multivitamin (RENA-VIT) tablet 1 tablet  1 tablet Oral QHS Windy Kalata, MD   1 tablet at 07/07/14 2200  . nitroGLYCERIN (NITROSTAT) SL tablet 0.4 mg  0.4 mg Sublingual Q5 min PRN Grace Bushy Minor, NP      . ondansetron (ZOFRAN) injection 4 mg  4 mg Intravenous Q6H PRN Melton Alar, PA-C      . predniSONE (DELTASONE) tablet 60 mg  60 mg Oral Q breakfast Doree Fudge, MD   60 mg at 07/08/14 4196    Physical Exam: General appearance: alert, no distress and morbidly obese Lungs: clear to auscultation bilaterally Heart: regular rate and rhythm, S1, S2 normal, no murmur, click, rub or gallop Abdomen: soft, non-tender; bowel sounds normal; no masses,  no organomegaly Extremities: extremities normal, atraumatic, no cyanosis or edema  Lab Results: Results for orders placed during the hospital encounter of 07/05/14 (from the past 48 hour(s))  HEPARIN LEVEL (UNFRACTIONATED)     Status: Abnormal   Collection Time    07/07/14  3:25 AM      Result  Value Ref Range   Heparin Unfractionated 1.14 (*) 0.30 - 0.70 IU/mL   Comment:            IF HEPARIN RESULTS ARE BELOW     EXPECTED VALUES, AND PATIENT     DOSAGE HAS BEEN CONFIRMED,     SUGGEST FOLLOW UP TESTING     OF ANTITHROMBIN III LEVELS.     RESULTS CONFIRMED BY MANUAL DILUTION  CBC     Status: Abnormal   Collection Time    07/07/14  3:25 AM      Result Value Ref Range   WBC 12.2 (*) 4.0 - 10.5 K/uL   RBC 2.73 (*) 3.87 - 5.11 MIL/uL   Hemoglobin 8.0 (*) 12.0 - 15.0 g/dL   HCT 24.6 (*) 36.0 - 46.0 %   MCV 90.1  78.0 - 100.0 fL   MCH 29.3  26.0 - 34.0 pg   MCHC 32.5  30.0 - 36.0 g/dL   RDW 14.9  11.5 - 15.5 %   Platelets 210  150 - 400 K/uL  RENAL FUNCTION PANEL     Status: Abnormal   Collection Time    07/07/14  7:45 AM      Result Value Ref Range    Sodium 144  137 - 147 mEq/L   Potassium 3.6 (*) 3.7 - 5.3 mEq/L   Chloride 102  96 - 112 mEq/L   CO2 25  19 - 32 mEq/L   Glucose, Bld 109 (*) 70 - 99 mg/dL   BUN 76 (*) 6 - 23 mg/dL   Comment: DELTA CHECK NOTED   Creatinine, Ser 4.24 (*) 0.50 - 1.10 mg/dL   Comment: DELTA CHECK NOTED   Calcium 7.7 (*) 8.4 - 10.5 mg/dL   Phosphorus 7.9 (*) 2.3 - 4.6 mg/dL   Albumin 3.0 (*) 3.5 - 5.2 g/dL   GFR calc non Af Amer 10 (*) >90 mL/min   GFR calc Af Amer 11 (*) >90 mL/min   Comment: (NOTE)     The eGFR has been calculated using the CKD EPI equation.     This calculation has not been validated in all clinical situations.     eGFR's persistently <90 mL/min signify possible Chronic Kidney     Disease.   Anion gap 17 (*) 5 - 15  HEPARIN LEVEL (UNFRACTIONATED)     Status: Abnormal   Collection Time    07/07/14  2:00 PM      Result Value Ref Range   Heparin Unfractionated 1.26 (*) 0.30 - 0.70 IU/mL   Comment: RESULTS CONFIRMED BY MANUAL DILUTION                IF HEPARIN RESULTS ARE BELOW     EXPECTED VALUES, AND PATIENT     DOSAGE HAS BEEN CONFIRMED,     SUGGEST FOLLOW UP TESTING     OF ANTITHROMBIN III LEVELS.  HEPARIN LEVEL (UNFRACTIONATED)     Status: None   Collection Time    07/08/14  1:25 AM      Result Value Ref Range   Heparin Unfractionated 0.52  0.30 - 0.70 IU/mL   Comment:            IF HEPARIN RESULTS ARE BELOW     EXPECTED VALUES, AND PATIENT     DOSAGE HAS BEEN CONFIRMED,     SUGGEST FOLLOW UP TESTING     OF ANTITHROMBIN III LEVELS.  CBC     Status: Abnormal   Collection Time  07/08/14  5:00 AM      Result Value Ref Range   WBC 11.3 (*) 4.0 - 10.5 K/uL   RBC 2.56 (*) 3.87 - 5.11 MIL/uL   Hemoglobin 7.6 (*) 12.0 - 15.0 g/dL   HCT 23.4 (*) 36.0 - 46.0 %   MCV 91.4  78.0 - 100.0 fL   MCH 29.7  26.0 - 34.0 pg   MCHC 32.5  30.0 - 36.0 g/dL   RDW 14.7  11.5 - 15.5 %   Platelets 181  150 - 400 K/uL  RENAL FUNCTION PANEL     Status: Abnormal   Collection Time     07/08/14  5:00 AM      Result Value Ref Range   Sodium 142  137 - 147 mEq/L   Potassium 3.7  3.7 - 5.3 mEq/L   Chloride 100  96 - 112 mEq/L   CO2 26  19 - 32 mEq/L   Glucose, Bld 109 (*) 70 - 99 mg/dL   BUN 63 (*) 6 - 23 mg/dL   Creatinine, Ser 3.66 (*) 0.50 - 1.10 mg/dL   Calcium 8.0 (*) 8.4 - 10.5 mg/dL   Phosphorus 7.4 (*) 2.3 - 4.6 mg/dL   Albumin 2.9 (*) 3.5 - 5.2 g/dL   GFR calc non Af Amer 12 (*) >90 mL/min   GFR calc Af Amer 13 (*) >90 mL/min   Comment: (NOTE)     The eGFR has been calculated using the CKD EPI equation.     This calculation has not been validated in all clinical situations.     eGFR's persistently <90 mL/min signify possible Chronic Kidney     Disease.   Anion gap 16 (*) 5 - 15  PREPARE RBC (CROSSMATCH)     Status: None   Collection Time    07/08/14 10:15 AM      Result Value Ref Range   Order Confirmation ORDER PROCESSED BY BLOOD BANK    TYPE AND SCREEN     Status: None   Collection Time    07/08/14 10:15 AM      Result Value Ref Range   ABO/RH(D) O NEG     Antibody Screen NEG     Sample Expiration 07/11/2014     Unit Number [id number]     Blood Component Type RED CELLS,LR     Unit division 00     Status of Unit ISSUED     Transfusion Status OK TO TRANSFUSE     Crossmatch Result Compatible    ABO/RH     Status: None   Collection Time    07/08/14 10:15 AM      Result Value Ref Range   ABO/RH(D) O NEG      Imaging: No results found.  Assessment:  1. Active Problems: 2.   COPD with acute exacerbation 3.   History of DVT (deep vein thrombosis) 4.   STEMI (ST elevation myocardial infarction) 5.   Acute renal failure 6.   Tobacco abuse 7.   HTN (hypertension) 8.   CHF (congestive heart failure) 9.   GERD (gastroesophageal reflux disease) 10.   Gout 11.   S/P IVC filter 12.   CVA (cerebral vascular accident) 52.   NSTEMI (non-ST elevated myocardial infarction) 14.   Acute on chronic diastolic CHF (congestive heart failure), NYHA  class 3 15.   Plan:  1. Continue medical Rx for NSTEMI. Renal function will not support cardiac catheterization at this time. Agree with recommendation of catheterization  during this hospitalization.  Time Spent Directly with Patient:  15 minutes  Length of Stay:  LOS: 3 days   Pixie Casino, MD, [facility] Attending Cardiologist [facility]  HILTY,Kenneth C 07/08/2014, 1:52 PM

## 2014-07-08 NOTE — Progress Notes (Signed)
[place] TEAM 1 - Stepdown/ICU TEAM Progress Note  [name] [name] QZE:[id number] DOB: 06/28/1941 DOA: 07/05/2014 PCP: No primary provider on file.  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 Despite stopping Lasix and ACE I her renal function progressively worsened to a peak of ~7 (baseline of 1.2-2.5).  After arrival to [facility] Nephrology was consulted and emergent HD was initiated. Cardiac enzymes were also checked and were elevated in the absence of EKG changes. Cardiology was consulted. ECHO was WNL except for grade 1 diastolic dysfunction. IV Heparin was started. She stabilized and was able to transfer out to SDU.  HPI/Subjective: No CP or SOB  Assessment/Plan:  Acute renal failure  -HD per nephrology prn -making urine on high dose Lasix  COPD with acute exacerbation  -Continue supportive care with nebs/flutter valve and oxygen -Continue doxycycline  -Continue prednisone-taper soon  Hx DVT/PE  -Hx IVC filter placement -Heparin drip for presumed cardiac ischemia but had to dc 8/5 due to excessive bleeding around HD catheter -already on SCDs  NSTEMI -Per cardiology will need cardiac cath after acute renal failure resolves -dc Heparin drip due to bleeding -cont Imdur,ASA and statin -no BB due to COPD  Acute on chronic diastolic CHF  -See HTN -Echocardiogram WNL except for grade 1 diastolic dysfunction  HTN  -Amlodipine, Clonidine and Imdur   UTI -Enterococcus resistant to Levaquin -Sensitive to Ampicillin   Tobacco abuse  -Discussed abstinence  Hx CVA  -Neurologically intact  Code Status: FULL Family Communication: no family present at bedside Disposition Plan: Per cardiology/nephrology  Consultants: Dr. York Ram (cardiology) Dr. Primitivo Gauze (nephrology) Dr. Evalyn Casco (PCCM)   Procedure/Significant Events: Echocardiogram  - Left ventricle: The cavity size was normal. There was moderate concentric hypertrophy. Systolic function was normal. The estimated ejection fraction was in the range of 60% to 65%. Wall motion was normal; there were no regional wall motion abnormalities. Doppler parameters are consistent with abnormal left ventricular relaxation (grade 1 diastolic dysfunction). There was no evidence of elevated ventricular filling pressure by Doppler parameters. - Aortic valve: Trileaflet; normal thickness leaflets. Transvalvular velocity was within the normal range. There was no stenosis. There was no regurgitation. - Aortic root: The aortic root was normal in size. - Mitral valve: Structurally normal valve. There was no regurgitation. - Right ventricle: Systolic function was normal. - Right atrium: The atrium was normal in size. - Tricuspid valve: There was no regurgitation. - Pulmonary arteries: Systolic pressure was within the normal range.  Culture 8/2 blood right forearm/left arm NGTD 8/2 MRSA by PCR negative 8/3 urine positive enterococcus  Antibiotics: Doxycycline 8/3>> stopped 8/4 Ampicillin 8./4>>  DVT prophylaxis: SCDs  Objective: VITAL SIGNS: Temp: 97.2 F (36.2 C) (08/05 1300) Temp src: Oral (08/05 1300) BP: 156/71 mmHg (08/05 1300) Pulse Rate: 103 (08/05 1300) SPO2; FIO2:  Intake/Output Summary (Last 24 hours) at 07/08/14 1324 Last data filed at 07/08/14 1200  Gross per 24 hour  Intake  968.5 ml  Output    255 ml  Net  713.5 ml   Exam: General: No acute respiratory distress-note bleeding from right IJ dialysis catheter site Lungs: Clear to auscultation bilaterally without wheezes or crackles Cardiovascular: Regular rate and rhythm without murmur gallop or rub normal S1 and S2 Abdomen: Nontender, nondistended, soft,  bowel sounds  positive, no rebound, no ascites, no appreciable mass Extremities: No significant cyanosis, clubbing, or edema bilateral lower extremities  Data Reviewed: Basic Metabolic Panel:  Recent Labs Lab 07/05/14 1725 07/06/14 0500 07/07/14 0745 07/08/14 0500  NA 139 145 144 142  K 6.1* 5.7* 3.6* 3.7  CL 104 107 102 100  CO2 19 19 25 26   GLUCOSE 108* 118* 109* 109*  BUN 117* 126* 76* 63*  CREATININE 6.41* 6.81* 4.24* 3.66*  CALCIUM 8.0* 8.0* 7.7* 8.0*  MG 2.5  --   --   --   PHOS 7.6*  --  7.9* 7.4*   Liver Function Tests:  Recent Labs Lab 07/05/14 1725 07/07/14 0745 07/08/14 0500  AST 20  --   --   ALT 21  --   --   ALKPHOS 89  --   --   BILITOT <0.2*  --   --   PROT 5.5*  --   --   ALBUMIN 3.3* 3.0* 2.9*   CBC:  Recent Labs Lab 07/05/14 1725 07/06/14 0500 07/07/14 0325 07/08/14 0500  WBC 11.5* 10.7* 12.2* 11.3*  HGB 9.1* 8.5* 8.0* 7.6*  HCT 29.0* 27.1* 24.6* 23.4*  MCV 93.2 92.8 90.1 91.4  PLT 237 234 210 181   Cardiac Enzymes:  Recent Labs Lab 07/05/14 1725 07/06/14 0005 07/06/14 0438  TROPONINI 0.63* 0.99* 1.80*    Recent Results (from the past 240 hour(s))  MRSA PCR SCREENING     Status: None   Collection Time    07/05/14  3:26 PM      Result Value Ref Range Status   MRSA by PCR NEGATIVE  NEGATIVE Final   Comment:            The GeneXpert MRSA Assay (FDA     approved for NASAL specimens     only), is one component of a     comprehensive MRSA colonization     surveillance program. It is not     intended to diagnose MRSA     infection nor to guide or     monitor treatment for     MRSA infections.  CULTURE, BLOOD (ROUTINE X 2)     Status: None   Collection Time    07/05/14  5:20 PM      Result Value Ref Range Status   Specimen Description BLOOD RIGHT FOREARM   Final   Special Requests BOTTLES DRAWN AEROBIC ONLY 5CC   Final   Culture  Setup Time     Final   Value: 07/06/2014 00:54     [facility]   Culture     Final     Value:        BLOOD CULTURE RECEIVED NO GROWTH TO DATE CULTURE WILL BE HELD FOR 5 DAYS BEFORE ISSUING A FINAL NEGATIVE REPORT     Performed at [facility]   Report Status PENDING   Incomplete  CULTURE, BLOOD (ROUTINE X 2)     Status: None   Collection Time    07/05/14  5:25 PM      Result Value Ref Range Status   Specimen Description BLOOD LEFT ARM   Final   Special Requests BOTTLES DRAWN AEROBIC ONLY [facility]6CC   Final   Culture  Setup Time     Final   Value: 07/06/2014 00:54     [facility]formed at [facility]   Culture     Final   Value:  BLOOD CULTURE RECEIVED NO GROWTH TO DATE CULTURE WILL BE HELD FOR 5 DAYS BEFORE ISSUING A FINAL NEGATIVE REPORT     Performed at [facility]   Report Status PENDING   Incomplete  URINE CULTURE     Status: None   Collection Time    07/06/14  1:12 AM      Result Value Ref Range Status   Specimen Description URINE, RANDOM   Final   Special Requests NONE   Final   Culture  Setup Time     Final   Value: 07/06/2014 01:59     Performed at [facility] Count     Final   Value: >=100,000 COLONIES/ML     Performed at [facility]   Culture     Final   Value: ENTEROCOCCUS SPECIES     Performed at [facility]   Report Status 07/08/2014 FINAL   Final   Organism ID, Bacteria ENTEROCOCCUS SPECIES   Final     Studies:  Recent x-ray studies have been reviewed in detail by the Attending Physician  Scheduled Meds:  Scheduled Meds: . sodium chloride   Intravenous Once  . amLODipine  5 mg Oral Daily  . ampicillin (OMNIPEN) IV  1 g Intravenous Q12H  . aspirin EC  81 mg Oral Daily  . atorvastatin  40 mg Oral q1800  . furosemide  160 mg Intravenous 3 times per day  . ipratropium-albuterol  3 mL Nebulization TID  . isosorbide mononitrate  60 mg Oral Daily  . multivitamin  1 tablet Oral QHS  . predniSONE  60 mg Oral Q breakfast    Time spent on care of this patient: 35 mins   ELLIS,ALLISON L.  , ANP   [facility] Office  [phone] Pager - [phone]  On-Call/Text Page:      [email or web address]      password [email or web address]  If 7PM-7AM, please contact night-coverage [email or web address] Password [email or web address] 07/08/2014, 1:24 PM   LOS: 3 days   I have personally examined this patient and reviewed the entire database. I have reviewed the above note, made any necessary editorial changes, and agree with its content.  Lonia Blood, MD [facility]

## 2014-07-08 NOTE — Progress Notes (Signed)
S:Breathing better.  No N/V.  Bleeding from HD cath O:BP 106/66  Pulse 72  Temp(Src) 98 F (36.7 C) (Oral)  Resp 12  Ht 5\' 3"  (1.6 m)  Wt 93.668 kg (206 lb 8 oz)  BMI 36.59 kg/m2  SpO2 97%  Intake/Output Summary (Last 24 hours) at 07/08/14 0904 Last data filed at 07/08/14 0800  Gross per 24 hour  Intake    972 ml  Output   2255 ml  Net  -1283 ml   Weight change: -2.3 kg (-5 lb 1.1 oz) SHU:OHFGB and alert CVS:RRR Resp: fewer crackles Abd:+ BS NTND Ext: 0-tr edema NEURO:CNI Ox3 No asterixis Rt IJ temp HD cath with bloody bandage at exit site   . amLODipine  10 mg Oral Daily  . ampicillin (OMNIPEN) IV  1 g Intravenous Q12H  . aspirin EC  81 mg Oral Daily  . atorvastatin  40 mg Oral q1800  . cloNIDine  0.3 mg Oral QPM  . ipratropium-albuterol  3 mL Nebulization QID  . isosorbide mononitrate  60 mg Oral Daily  . multivitamin  1 tablet Oral QHS  . predniSONE  60 mg Oral Q breakfast   No results found. BMET    Component Value Date/Time   NA 142 07/08/2014 0500   K 3.7 07/08/2014 0500   CL 100 07/08/2014 0500   CO2 26 07/08/2014 0500   GLUCOSE 109* 07/08/2014 0500   BUN 63* 07/08/2014 0500   CREATININE 3.66* 07/08/2014 0500   CALCIUM 8.0* 07/08/2014 0500   GFRNONAA 12* 07/08/2014 0500   GFRAA 13* 07/08/2014 0500   CBC    Component Value Date/Time   WBC 11.3* 07/08/2014 0500   RBC 2.56* 07/08/2014 0500   HGB 7.6* 07/08/2014 0500   HCT 23.4* 07/08/2014 0500   PLT 181 07/08/2014 0500   MCV 91.4 07/08/2014 0500   MCH 29.7 07/08/2014 0500   MCHC 32.5 07/08/2014 0500   RDW 14.7 07/08/2014 0500   LYMPHSABS 1.2 03/22/2013 1347   MONOABS 0.3 03/22/2013 1347   EOSABS 0.7 03/22/2013 1347   BASOSABS 0.0 03/22/2013 1347     Assessment: 1. 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 Hyperkalemia, resolved 3. NSTEMI  4. Hx HTN 5. COPD 6. Anemia sec CKD and acute blood loss  Plan: 1. DC heparin 2. Transfuse 1 unit 3. IV lasix to stimulate UO.  If UO does not improve then will plan HD  in AM.  I estimate 50/50 chance for renal recovery 4.  BP lowish so will decrease antiHTN Tersa Fotopoulos T

## 2014-07-08 NOTE — Progress Notes (Signed)
ANTICOAGULATION CONSULT NOTE - Follow Up Consult  Pharmacy Consult for Heparin  Indication: chest pain/ACS  Allergies  Allergen Reactions  . Ciprofloxacin     rash    Patient Measurements: Height: 5\' 3"  (160 cm) Weight: 207 lb 0.2 oz (93.9 kg) IBW/kg (Calculated) : 52.4 Heparin Dosing Weight:75 kg  Vital Signs: Temp: 97.8 F (36.6 C) (08/04 2310) Temp src: Oral (08/04 2310) BP: 118/31 mmHg (08/05 0000) Pulse Rate: 68 (08/05 0000)  Labs:  Recent Labs  07/05/14 1725 07/06/14 0005 07/06/14 0438 07/06/14 0500 07/07/14 0325 07/07/14 0745 07/07/14 1400 07/08/14 0125  HGB 9.1*  --   --  8.5* 8.0*  --   --   --   HCT 29.0*  --   --  27.1* 24.6*  --   --   --   PLT 237  --   --  234 210  --   --   --   APTT 24  --   --   --   --   --   --   --   LABPROT 14.2  --   --   --   --   --   --   --   INR 1.10  --   --   --   --   --   --   --   HEPARINUNFRC  --   --   --   --  1.14*  --  1.26* 0.52  CREATININE 6.41*  --   --  6.81*  --  4.24*  --   --   TROPONINI 0.63* 0.99* 1.80*  --   --   --   --   --     Estimated Creatinine Clearance: 13.3 ml/min (by C-G formula based on Cr of 4.24).   Medications:  Heparin 500 units/hr  Assessment:  Therapeutic heparin level after rate decrease, other labs as above.   Goal of Therapy:  Heparin level 0.3-0.7 units/ml Monitor platelets by anticoagulation protocol: Yes   Plan:  -Continue heparin at 500 units/hr -1000 HL -Daily CBC/HL -Monitor for bleeding  Abran Duke 07/08/2014,1:52 AM

## 2014-07-09 DIAGNOSIS — Z86711 Personal history of pulmonary embolism: Secondary | ICD-10-CM

## 2014-07-09 LAB — CBC
HCT: 23.9 % — ABNORMAL LOW (ref 36.0–46.0)
Hemoglobin: 8.1 g/dL — ABNORMAL LOW (ref 12.0–15.0)
MCH: 29.7 pg (ref 26.0–34.0)
MCHC: 33.9 g/dL (ref 30.0–36.0)
MCV: 87.5 fL (ref 78.0–100.0)
PLATELETS: 164 10*3/uL (ref 150–400)
RBC: 2.73 MIL/uL — AB (ref 3.87–5.11)
RDW: 14.8 % (ref 11.5–15.5)
WBC: 11.9 10*3/uL — AB (ref 4.0–10.5)

## 2014-07-09 LAB — TYPE AND SCREEN
ABO/RH(D): O NEG
Antibody Screen: NEGATIVE
Unit division: 0

## 2014-07-09 LAB — RENAL FUNCTION PANEL
ALBUMIN: 2.9 g/dL — AB (ref 3.5–5.2)
Anion gap: 19 — ABNORMAL HIGH (ref 5–15)
BUN: 88 mg/dL — ABNORMAL HIGH (ref 6–23)
CHLORIDE: 98 meq/L (ref 96–112)
CO2: 23 mEq/L (ref 19–32)
Calcium: 8.2 mg/dL — ABNORMAL LOW (ref 8.4–10.5)
Creatinine, Ser: 4.35 mg/dL — ABNORMAL HIGH (ref 0.50–1.10)
GFR calc Af Amer: 11 mL/min — ABNORMAL LOW (ref >90)
GFR calc non Af Amer: 9 mL/min — ABNORMAL LOW (ref >90)
Glucose, Bld: 104 mg/dL — ABNORMAL HIGH (ref 70–99)
POTASSIUM: 3.7 meq/L (ref 3.7–5.3)
Phosphorus: 7.5 mg/dL — ABNORMAL HIGH (ref 2.3–4.6)
Sodium: 140 mEq/L (ref 137–147)

## 2014-07-09 MED ORDER — FUROSEMIDE 10 MG/ML IJ SOLN
120.0000 mg | Freq: Two times a day (BID) | INTRAVENOUS | Status: DC
  Administered 2014-07-09 – 2014-07-10 (×2): 120 mg via INTRAVENOUS
  Filled 2014-07-09 (×4): qty 12

## 2014-07-09 MED ORDER — CLONIDINE HCL 0.1 MG PO TABS
0.1000 mg | ORAL_TABLET | ORAL | Status: DC | PRN
  Administered 2014-07-11: 0.1 mg via ORAL
  Filled 2014-07-09 (×3): qty 1

## 2014-07-09 MED ORDER — SODIUM CHLORIDE 0.9 % IJ SOLN
10.0000 mL | INTRAMUSCULAR | Status: DC | PRN
  Administered 2014-07-09 – 2014-07-13 (×7): 10 mL

## 2014-07-09 MED ORDER — POTASSIUM CHLORIDE CRYS ER 20 MEQ PO TBCR
20.0000 meq | EXTENDED_RELEASE_TABLET | Freq: Once | ORAL | Status: AC
  Administered 2014-07-09: 20 meq via ORAL

## 2014-07-09 NOTE — Progress Notes (Signed)
[place] TEAM 1 - Stepdown/ICU TEAM Progress Note  [name] [name] WUJ:[id number] DOB: 1941-05-05 DOA: 07/05/2014 PCP: No primary provider on file.  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 Despite stopping Lasix and ACE I her renal function progressively worsened to a peak of ~7 (baseline of 1.2-2.5).  After arrival to [facility] Nephrology was consulted and emergent HD was initiated. Cardiac enzymes were also checked and were elevated in the absence of EKG changes. Cardiology was consulted. ECHO was WNL except for grade 1 diastolic dysfunction. IV Heparin was started. She stabilized and was able to transfer out to SDU.  Please note that prior to presentation to [facility] patient was having classic cardiac ischemic symptoms with severe jaw pain that radiated down the neck and in her upper back. This was also associated with chest pain and shortness of breath.  HPI/Subjective: Remains chest pain free and denies shortness of breath. No further bleeding at dialysis catheter site.  Assessment/Plan:  Acute renal failure  -HD per nephrology prn -making urine on high dose Lasix  COPD with acute exacerbation  -Continue supportive care with nebs/flutter valve and oxygen -Continue doxycycline  -Continue prednisone-taper soon?-Currently not wheezing -Was not on chronic systemic steroids prior to admission but was on Advair and apparently Dulera prior to admission  Hx DVT/PE  -Hx IVC filter placement -Heparin drip for presumed cardiac ischemia but had to dc 8/5 due to excessive bleeding around HD catheter -already on SCDs  NSTEMI -Per cardiology will need cardiac cath after acute renal failure resolves noting patient had classic ischemic chest  pain radiating to jaw, neck and back prior to presentation to [facility] hospital -dc Heparin drip due to bleeding -cont Imdur,ASA and statin -no BB due to COPD -Depending on renal function course considering cardiac catheterization versus noninvasive evaluation with Lexiscan Myoview  Acute on chronic diastolic CHF  -See HTN -Echocardiogram WNL except for grade 1 diastolic dysfunction  HTN  -Amlodipine, Clonidine and Imdur   UTI -Enterococcus resistant to Levaquin -Sensitive to Ampicillin   Tobacco abuse  -Discussed abstinence  Hx CVA  -Neurologically intact  Code Status: FULL Family Communication: no family present at bedside Disposition Plan: Per cardiology/nephrology  Consultants: Dr. York Ram (cardiology) Dr. Primitivo Gauze (nephrology) Dr. Evalyn Casco (PCCM)   Procedure/Significant Events: Echocardiogram  - Left ventricle: The cavity size was normal. There was moderate concentric hypertrophy. Systolic function was normal. The estimated ejection fraction was in the range of 60% to 65%. Wall motion was normal; there were no regional wall motion abnormalities. Doppler parameters are consistent with abnormal left ventricular relaxation (grade 1 diastolic dysfunction). There was no evidence of elevated ventricular filling pressure by Doppler parameters. - Aortic valve: Trileaflet; normal thickness leaflets. Transvalvular velocity was within the normal range. There was no stenosis. There was no regurgitation. - Aortic root: The aortic root was normal in size. - Mitral valve: Structurally normal valve. There was no regurgitation. - Right ventricle: Systolic function was normal. - Right atrium: The atrium was normal in size. - Tricuspid valve: There was no regurgitation. - Pulmonary arteries: Systolic pressure was within the normal range.  Culture 8/2 blood right forearm/left arm NGTD 8/2 MRSA by PCR negative 8/3 urine positive  enterococcus  Antibiotics: Doxycycline 8/3>> stopped 8/4 Ampicillin 8./4>>  DVT  prophylaxis: SCDs  Objective: VITAL SIGNS: Temp: 98 F (36.7 C) (08/06 1117) Temp src: Oral (08/06 1117) BP: 152/58 mmHg (08/06 1117) Pulse Rate: 71 (08/06 1117) SPO2; FIO2:  Intake/Output Summary (Last 24 hours) at 07/09/14 1258 Last data filed at 07/09/14 1200  Gross per 24 hour  Intake   1313 ml  Output   1895 ml  Net   -582 ml   Exam: General: No acute respiratory distress-no Further bleeding from right IJ dialysis catheter site Lungs: Clear to auscultation bilaterally without wheezes or crackles, RA Cardiovascular: Regular rate and rhythm without murmur gallop or rub normal S1 and S2 Abdomen: Nontender, nondistended, soft, bowel sounds positive, no rebound, no ascites, no appreciable mass Extremities: No significant cyanosis, clubbing, or edema bilateral lower extremities  Data Reviewed: Basic Metabolic Panel:  Recent Labs Lab 07/05/14 1725 07/06/14 0500 07/07/14 0745 07/08/14 0500 07/09/14 0410  NA 139 145 144 142 140  K 6.1* 5.7* 3.6* 3.7 3.7  CL 104 107 102 100 98  CO2 19 19 25 26 23   GLUCOSE 108* 118* 109* 109* 104*  BUN 117* 126* 76* 63* 88*  CREATININE 6.41* 6.81* 4.24* 3.66* 4.35*  CALCIUM 8.0* 8.0* 7.7* 8.0* 8.2*  MG 2.5  --   --   --   --   PHOS 7.6*  --  7.9* 7.4* 7.5*   Liver Function Tests:  Recent Labs Lab 07/05/14 1725 07/07/14 0745 07/08/14 0500 07/09/14 0410  AST 20  --   --   --   ALT 21  --   --   --   ALKPHOS 89  --   --   --   BILITOT <0.2*  --   --   --   PROT 5.5*  --   --   --   ALBUMIN 3.3* 3.0* 2.9* 2.9*   CBC:  Recent Labs Lab 07/05/14 1725 07/06/14 0500 07/07/14 0325 07/08/14 0500 07/09/14 0410  WBC 11.5* 10.7* 12.2* 11.3* 11.9*  HGB 9.1* 8.5* 8.0* 7.6* 8.1*  HCT 29.0* 27.1* 24.6* 23.4* 23.9*  MCV 93.2 92.8 90.1 91.4 87.5  PLT 237 234 210 181 164   Cardiac Enzymes:  Recent Labs Lab 07/05/14 1725 07/06/14 0005  07/06/14 0438  TROPONINI 0.63* 0.99* 1.80*    Recent Results (from the past 240 hour(s))  MRSA PCR SCREENING     Status: None   Collection Time    07/05/14  3:26 PM      Result Value Ref Range Status   MRSA by PCR NEGATIVE  NEGATIVE Final   Comment:            The GeneXpert MRSA Assay (FDA     approved for NASAL specimens     only), is one component of a     comprehensive MRSA colonization     surveillance program. It is not     intended to diagnose MRSA     infection nor to guide or     monitor treatment for     MRSA infections.  CULTURE, BLOOD (ROUTINE X 2)     Status: None   Collection Time    07/05/14  5:20 PM      Result Value Ref Range Status   Specimen Description BLOOD RIGHT FOREARM   Final   Special Requests BOTTLES DRAWN AEROBIC ONLY 5CC   Final   Culture  Setup Time     Final   Value: 07/06/2014 00:54     Performed at [facility]  Culture     Final   Value:        BLOOD CULTURE RECEIVED NO GROWTH TO DATE CULTURE WILL BE HELD FOR 5 DAYS BEFORE ISSUING A FINAL NEGATIVE REPORT     Performed at [facility]   Report Status PENDING   Incomplete  CULTURE, BLOOD (ROUTINE X 2)     Status: None   Collection Time    07/05/14  5:25 PM      Result Value Ref Range Status   Specimen Description BLOOD LEFT ARM   Final   Special Requests BOTTLES DRAWN AEROBIC ONLY 6CC   Final   Culture  Setup Time     Final   Value: 07/06/2014 00:54     Performed at [facility]   Culture     Final   Value:        BLOOD CULTURE RECEIVED NO GROWTH TO DATE CULTURE WILL BE HELD FOR 5 DAYS BEFORE ISSUING A FINAL NEGATIVE REPORT     Performed at [facility]   Report Status PENDING   Incomplete  URINE CULTURE     Status: None   Collection Time    07/06/14  1:12 AM      Result Value Ref Range Status   Specimen Description URINE, RANDOM   Final   Special Requests NONE   Final   Culture  Setup Time     Final   Value: 07/06/2014 01:59     Performed at [facility]     Final   Value: >=100,000 COLONIES/ML     Performed at [facility]   Culture     Final   Value: ENTEROCOCCUS SPECIES     Performed at [facility]   Report Status 07/08/2014 FINAL   Final   Organism ID, Bacteria ENTEROCOCCUS SPECIES   Final     Studies:  Recent x-ray studies have been reviewed in detail by the Attending Physician  Scheduled Meds:  Scheduled Meds: . amLODipine  5 mg Oral Daily  . ampicillin (OMNIPEN) IV  1 g Intravenous Q12H  . aspirin EC  81 mg Oral Daily  . atorvastatin  40 mg Oral q1800  . furosemide  120 mg Intravenous BID  . ipratropium-albuterol  3 mL Nebulization TID  . isosorbide mononitrate  60 mg Oral Daily  . metoprolol tartrate  12.5 mg Oral BID  . multivitamin  1 tablet Oral QHS  . predniSONE  60 mg Oral Q breakfast    Time spent on care of this patient: 35 mins   ELLIS,ALLISON L. , ANP   [facility] Office  [phone] Pager - [phone]  On-Call/Text Page:      [email or web address]      password [email or web address]  If 7PM-7AM, please contact night-coverage [email or web address] Password [email or web address] 07/09/2014, 12:58 PM   LOS: 4 days   Examined patient with ANP Revonda Standard and agree with assessment and plan. Patient with multiple complex medical problems direct patient care> 40 minute

## 2014-07-09 NOTE — Progress Notes (Signed)
Report called to receiving rn [id number]. Will transfer via WC.

## 2014-07-09 NOTE — Progress Notes (Signed)
TELEMETRY: Reviewed telemetry pt in NSR: Filed Vitals:   07/09/14 0300 07/09/14 0347 07/09/14 0810 07/09/14 1117  BP:  146/53 173/61 152/58  Pulse: 69 67 68 71  Temp: 98 F (36.7 C)  97.9 F (36.6 C) 98 F (36.7 C)  TempSrc: Oral  Oral Oral  Resp: 16 15 14 17   Height:      Weight: 207 lb 1.6 oz (93.94 kg)     SpO2: 100% 100% 97% 95%    Intake/Output Summary (Last 24 hours) at 07/09/14 1243 Last data filed at 07/09/14 0821  Gross per 24 hour  Intake    833 ml  Output   1895 ml  Net  -1062 ml   Filed Weights   07/07/14 1135 07/08/14 0500 07/09/14 0300  Weight: 207 lb 0.2 oz (93.9 kg) 206 lb 8 oz (93.668 kg) 207 lb 1.6 oz (93.94 kg)    Subjective Still wheezing some. No chest or jaw pain.  Marland Kitchen amLODipine  5 mg Oral Daily  . ampicillin (OMNIPEN) IV  1 g Intravenous Q12H  . aspirin EC  81 mg Oral Daily  . atorvastatin  40 mg Oral q1800  . furosemide  120 mg Intravenous BID  . ipratropium-albuterol  3 mL Nebulization TID  . isosorbide mononitrate  60 mg Oral Daily  . metoprolol tartrate  12.5 mg Oral BID  . multivitamin  1 tablet Oral QHS  . predniSONE  60 mg Oral Q breakfast   . sodium chloride 10 mL (07/05/14 2200)    LABS: Basic Metabolic Panel:  Recent Labs  82/99/37 0500 07/09/14 0410  NA 142 140  K 3.7 3.7  CL 100 98  CO2 26 23  GLUCOSE 109* 104*  BUN 63* 88*  CREATININE 3.66* 4.35*  CALCIUM 8.0* 8.2*  PHOS 7.4* 7.5*   Liver Function Tests:  Recent Labs  07/08/14 0500 07/09/14 0410  ALBUMIN 2.9* 2.9*   No results found for this basename: LIPASE, AMYLASE,  in the last 72 hours CBC:  Recent Labs  07/08/14 0500 07/09/14 0410  WBC 11.3* 11.9*  HGB 7.6* 8.1*  HCT 23.4* 23.9*  MCV 91.4 87.5  PLT 181 164   Cardiac Enzymes: No results found for this basename: CKTOTAL, CKMB, CKMBINDEX, TROPONINI,  in the last 72 hours BNP: No results found for this basename: PROBNP,  in the last 72 hours D-Dimer: No results found for this basename:  DDIMER,  in the last 72 hours Hemoglobin A1C: No results found for this basename: HGBA1C,  in the last 72 hours Fasting Lipid Panel: No results found for this basename: CHOL, HDL, LDLCALC, TRIG, CHOLHDL, LDLDIRECT,  in the last 72 hours Thyroid Function Tests: No results found for this basename: TSH, T4TOTAL, FREET3, T3FREE, THYROIDAB,  in the last 72 hours   Radiology/Studies:  COMPARISON: 07/02/2014  FINDINGS:  There is a right IJ dialysis catheter, tip in the upper right atrium  or cavoatrial junction. No pneumothorax, new mediastinal widening,  or pleural effusion.  Mild cardiomegaly which is stable from prior. Mild pulmonary  hyperinflation. No edema. Patchy sclerosis in the right humeral head  which may reflect AVN.  IMPRESSION:  New right IJ dialysis catheter without adverse feature.  Electronically Signed  By: Tiburcio Pea M.D.  On: 07/05/2014 23:29   Ecg: 07/05/14 NSR with inferolateral ST-T wave changes.  Echo: Study Conclusions  - Left ventricle: The cavity size was normal. There was moderate concentric hypertrophy. Systolic function was normal. The estimated ejection fraction was in  the range of 60% to 65%. Wall motion was normal; there were no regional wall motion abnormalities. Doppler parameters are consistent with abnormal left ventricular relaxation (grade 1 diastolic dysfunction). There was no evidence of elevated ventricular filling pressure by Doppler parameters. - Aortic valve: Trileaflet; normal thickness leaflets. Transvalvular velocity was within the normal range. There was no stenosis. There was no regurgitation. - Aortic root: The aortic root was normal in size. - Mitral valve: Structurally normal valve. There was no regurgitation. - Right ventricle: Systolic function was normal. - Right atrium: The atrium was normal in size. - Tricuspid valve: There was no regurgitation. - Pulmonary arteries: Systolic pressure was within the  normal range.  Impressions:  - Moderate LVH with abnormal relaxation. Otherwise normal study.    PHYSICAL EXAM General: Well developed, obese, in no acute distress. Head: Normocephalic, atraumatic, sclera non-icteric, oropharynx is clear Neck: Negative for carotid bruits. JVD not elevated. No adenopathy Lungs: Bilateral wheezes. Breathing is unlabored. Heart: RRR S1 S2 without murmurs, rubs, or gallops.  Abdomen: Soft, non-tender, non-distended with normoactive bowel sounds.  Msk:  Strength and tone appears normal for age. Extremities: No clubbing, cyanosis or edema.  Distal pedal pulses are 2+ and equal bilaterally. Neuro: Alert and oriented X 3. Moves all extremities spontaneously. Psych:  Responds to questions appropriately with a normal affect.  ASSESSMENT AND PLAN: 1. NSTEMI. Peak troponin 6.1. Normal EF by Echo. No recurrent chest pain. On metoprolol, amlodipine, and nitrates. Continue ASA. Patient reports severe jaw pain on admission. Ideally would recommend cardiac cath once renal function recovers or patient becomes dialysis dependent. Otherwise would consider noninvasive evaluation with Steffanie DunnLexiscan myoview to assess risk. Will continue to follow.  2. Acute renal failure.  3. COPD with exacerbation.   4. History of DVT/PE. IVC filter in place.   Present on Admission:  . COPD with acute exacerbation  Signed, Roxy Filler [place], MDFACC 07/09/2014 12:43 PM

## 2014-07-09 NOTE — Progress Notes (Signed)
S:Breathing better.  No N/V.  Bleeding from cath stopped O:BP 173/61  Pulse 68  Temp(Src) 97.9 F (36.6 C) (Oral)  Resp 14  Ht 5\' 3"  (1.6 m)  Wt 93.94 kg (207 lb 1.6 oz)  BMI 36.70 kg/m2  SpO2 97%  Intake/Output Summary (Last 24 hours) at 07/09/14 0849 Last data filed at 07/09/14 0500  Gross per 24 hour  Intake  974.5 ml  Output   2245 ml  Net -1270.5 ml   Weight change: -2.06 kg (-4 lb 8.7 oz) FGH:WEXHB and alert CVS:RRR Resp: fewer crackles Abd:+ BS NTND Ext: no edema NEURO:CNI Ox3 No asterixis Rt IJ temp HD no bleeding   . amLODipine  5 mg Oral Daily  . ampicillin (OMNIPEN) IV  1 g Intravenous Q12H  . aspirin EC  81 mg Oral Daily  . atorvastatin  40 mg Oral q1800  . furosemide  160 mg Intravenous 3 times per day  . ipratropium-albuterol  3 mL Nebulization TID  . isosorbide mononitrate  60 mg Oral Daily  . metoprolol tartrate  12.5 mg Oral BID  . multivitamin  1 tablet Oral QHS  . predniSONE  60 mg Oral Q breakfast   No results found. BMET    Component Value Date/Time   NA 140 07/09/2014 0410   K 3.7 07/09/2014 0410   CL 98 07/09/2014 0410   CO2 23 07/09/2014 0410   GLUCOSE 104* 07/09/2014 0410   BUN 88* 07/09/2014 0410   CREATININE 4.35* 07/09/2014 0410   CALCIUM 8.2* 07/09/2014 0410   GFRNONAA 9* 07/09/2014 0410   GFRAA 11* 07/09/2014 0410   CBC    Component Value Date/Time   WBC 11.9* 07/09/2014 0410   RBC 2.73* 07/09/2014 0410   HGB 8.1* 07/09/2014 0410   HCT 23.9* 07/09/2014 0410   PLT 164 07/09/2014 0410   MCV 87.5 07/09/2014 0410   MCH 29.7 07/09/2014 0410   MCHC 33.9 07/09/2014 0410   RDW 14.8 07/09/2014 0410   LYMPHSABS 1.2 03/22/2013 1347   MONOABS 0.3 03/22/2013 1347   EOSABS 0.7 03/22/2013 1347   BASOSABS 0.0 03/22/2013 1347     Assessment: 1. 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 STEMI  3. Hx HTN 4. COPD 5. Anemia sec CKD and acute blood loss  Plan: 1. Decrease lasix to BID 2. Give KCL 3.  Will not plan HD today.  If Scr sig  higher tomorrow then will do HD again. 4. PRN clonidine for BP [name] [name]

## 2014-07-10 DIAGNOSIS — Z86718 Personal history of other venous thrombosis and embolism: Secondary | ICD-10-CM

## 2014-07-10 LAB — RENAL FUNCTION PANEL
ALBUMIN: 3.5 g/dL (ref 3.5–5.2)
Anion gap: 21 — ABNORMAL HIGH (ref 5–15)
BUN: 106 mg/dL — AB (ref 6–23)
CALCIUM: 8.9 mg/dL (ref 8.4–10.5)
CHLORIDE: 96 meq/L (ref 96–112)
CO2: 26 mEq/L (ref 19–32)
Creatinine, Ser: 4.71 mg/dL — ABNORMAL HIGH (ref 0.50–1.10)
GFR calc Af Amer: 10 mL/min — ABNORMAL LOW (ref >90)
GFR calc non Af Amer: 8 mL/min — ABNORMAL LOW (ref >90)
Glucose, Bld: 103 mg/dL — ABNORMAL HIGH (ref 70–99)
Phosphorus: 7.6 mg/dL — ABNORMAL HIGH (ref 2.3–4.6)
Potassium: 4 mEq/L (ref 3.7–5.3)
Sodium: 143 mEq/L (ref 137–147)

## 2014-07-10 LAB — CBC
HCT: 27.4 % — ABNORMAL LOW (ref 36.0–46.0)
Hemoglobin: 9.4 g/dL — ABNORMAL LOW (ref 12.0–15.0)
MCH: 29.8 pg (ref 26.0–34.0)
MCHC: 34.3 g/dL (ref 30.0–36.0)
MCV: 87 fL (ref 78.0–100.0)
Platelets: 209 10*3/uL (ref 150–400)
RBC: 3.15 MIL/uL — ABNORMAL LOW (ref 3.87–5.11)
RDW: 14.6 % (ref 11.5–15.5)
WBC: 14.1 10*3/uL — AB (ref 4.0–10.5)

## 2014-07-10 MED ORDER — PREDNISONE 20 MG PO TABS
30.0000 mg | ORAL_TABLET | Freq: Every day | ORAL | Status: DC
  Administered 2014-07-11 – 2014-07-12 (×2): 30 mg via ORAL
  Filled 2014-07-10 (×3): qty 1

## 2014-07-10 MED ORDER — PANTOPRAZOLE SODIUM 40 MG PO TBEC
40.0000 mg | DELAYED_RELEASE_TABLET | Freq: Every day | ORAL | Status: DC
  Administered 2014-07-10 – 2014-07-13 (×4): 40 mg via ORAL
  Filled 2014-07-10 (×4): qty 1

## 2014-07-10 NOTE — Care Management Note (Signed)
    Page 1 of 1   07/13/2014     11:54:13 AM CARE MANAGEMENT NOTE 07/13/2014  Patient:  [name],[name]   Account Number:  [id number]  Date Initiated:  07/10/2014  Documentation initiated by:  Donn Pierini  Subjective/Objective Assessment:   Pt admitted with stemi and acute renal failure     Action/Plan:   PTA pt lived at home alone- has shower chair and railings at home   Anticipated DC Date:  07/14/2014   Anticipated DC Plan:  HOME W HOME HEALTH SERVICES      DC Planning Services  CM consult      Choice offered to / List presented to:             Status of service:  In process, will continue to follow Medicare Important Message given?  YES (If response is "NO", the following Medicare IM given date fields will be blank) Date Medicare IM given:  07/10/2014 Medicare IM given by:  Donn Pierini Date Additional Medicare IM given:  07/13/2014 Additional Medicare IM given by:  Donn Pierini  Discharge Disposition:    Per UR Regulation:  Reviewed for med. necessity/level of care/duration of stay  If discussed at Long Length of Stay Meetings, dates discussed:   07/14/2014    Comments:  07/10/14- 1100- Donn Pierini RN, BSN [phone] Spoke with pt at bedside- pt states that she was independent at home- hopeful to return home- she does state that she feels that she needs home 02- although she is not currently on 02- discussed with pt Medicare guidelines for home 02- will assess need as pt gets closer to discharge and starts to ambulate. Copy of Medicare IM that was mailed given to pt and signed copy placed on shadow chart. NCM to continue to follow.

## 2014-07-10 NOTE — Progress Notes (Addendum)
[facility] PROGRESS NOTE  [name] [name] MWU:[id number] DOB: 07/27/1941 DOA: 07/05/2014 PCP: No primary provider on file.  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 Despite stopping Lasix and ACE I her renal function progressively worsened to a peak of ~7 (baseline of 1.2-2.5).  -[facility] and emergent HD was initiated. Cardiac enzymes were also checked and were elevated in the absence of EKG changes. Cardiology was consulted. ECHO was WNL except for grade 1 diastolic dysfunction. IV Heparin was started. She stabilized and was able to transfer out to SDU.   1. 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 COPD with acute exacerbation; CXR : no clear infiltrates; improved; no wheezing on exam   -cont nebs, bronchodilators; oxygen, doxycycline prednisone-taper  3. Hx DVT/PE Hx IVC filter placement; [facility]  -Heparin drip for presumed cardiac ischemia but had to dc 8/5 due to excessive bleeding around HD catheter  -already on SCDs; re scan legs;  4. NSTEMI; chest pain resolved  -Per cardiology will need cardiac cath after acute renal failure resolves noting  -dc Heparin drip due to bleeding; cont Imdur,ASA and statin;added BB 5. Acute on chronic diastolic CHF  -Echocardiogram WNL except for grade 1 diastolic dysfunction; cont diuresis per nephrology  6. HTN Amlodipine, Clonidine and Imdur, BB  7. UTI Enterococcus resistant to Levaquin; afebrile;  -Sensitive to Ampicillin  8. Tobacco abuse Discussed abstinence  9. Hx CVA Neurologically intact  10. Anemia, AoCD, with episode of blood loss; TFsed 8/5; Hg stable; off heparin; cont monitor TF prn      Consultants:  Dr. York RamJonathan Barry (cardiology)  Dr. Primitivo GauzeMichael Mattingly (nephrology)  Dr. Evalyn CascoAdam Belanger (PCCM)   Procedure/Significant Events:  Echocardiogram  - Left ventricle: The cavity size was normal. There was moderate concentric hypertrophy. Systolic function was normal. The estimated ejection fraction was in the range of 60% to 65%. Wall motion was normal; there were no regional wall motion abnormalities. Doppler parameters are consistent with abnormal left ventricular relaxation (grade 1 diastolic dysfunction). There was no evidence of elevated ventricular filling pressure by Doppler parameters. - Aortic valve: Trileaflet; normal thickness leaflets. Transvalvular velocity was within the normal range. There was no stenosis. There was no regurgitation. - Aortic root: The aortic root was normal in size. - Mitral valve: Structurally normal valve. There was no regurgitation. - Right ventricle: Systolic function was normal. - Right atrium: The atrium was normal in size. - Tricuspid valve: There was no regurgitation. - Pulmonary arteries: Systolic pressure was within the normal range.  Culture  8/2 blood right forearm/left arm NGTD  8/2 MRSA by PCR negative  8/3 urine positive enterococcus  Antibiotics:  Doxycycline 8/3>> stopped 8/4  Ampicillin 8./4>>   Code Status: full Family Communication: d/w patient (indicate person spoken with, relationship, and if by phone, the number) Disposition Plan: pend clinical improvement   HPI/Subjective: alert  Objective: Filed Vitals:   07/10/14 0506  BP: 162/55  Pulse: 76  Temp: 98 F (36.7 C)  Resp: 18    Intake/Output Summary (Last 24 hours) at 07/10/14 1015 Last data filed at 07/10/14 0900  Gross per 24 hour  Intake    840 ml  Output   2600  ml  Net  -1760 ml   Filed Weights   07/08/14 0500 07/09/14 0300 07/10/14 0506  Weight: 93.668 kg (206 lb 8 oz) 93.94 kg (207 lb 1.6 oz) 91.264 kg (201 lb 3.2 oz)     Exam:   General:  Alert, oriented   Cardiovascular: s1,s2 rrr  Respiratory: few crackles   Abdomen: soft, nt,nd   Musculoskeletal: mild edema   Data Reviewed: Basic Metabolic Panel:  Recent Labs Lab 07/05/14 1725 07/06/14 0500 07/07/14 0745 07/08/14 0500 07/09/14 0410 07/10/14 0310  NA 139 145 144 142 140 143  K 6.1* 5.7* 3.6* 3.7 3.7 4.0  CL 104 107 102 100 98 96  CO2 19 19 25 26 23 26   GLUCOSE 108* 118* 109* 109* 104* 103*  BUN 117* 126* 76* 63* 88* 106*  CREATININE 6.41* 6.81* 4.24* 3.66* 4.35* 4.71*  CALCIUM 8.0* 8.0* 7.7* 8.0* 8.2* 8.9  MG 2.5  --   --   --   --   --   PHOS 7.6*  --  7.9* 7.4* 7.5* 7.6*   Liver Function Tests:  Recent Labs Lab 07/05/14 1725 07/07/14 0745 07/08/14 0500 07/09/14 0410 07/10/14 0310  AST 20  --   --   --   --   ALT 21  --   --   --   --   ALKPHOS 89  --   --   --   --   BILITOT <0.2*  --   --   --   --   PROT 5.5*  --   --   --   --   ALBUMIN 3.3* 3.0* 2.9* 2.9* 3.5   No results found for this basename: LIPASE, AMYLASE,  in the last 168 hours No results found for this basename: AMMONIA,  in the last 168 hours CBC:  Recent Labs Lab 07/06/14 0500 07/07/14 0325 07/08/14 0500 07/09/14 0410 07/10/14 0310  WBC 10.7* 12.2* 11.3* 11.9* 14.1*  HGB 8.5* 8.0* 7.6* 8.1* 9.4*  HCT 27.1* 24.6* 23.4* 23.9* 27.4*  MCV 92.8 90.1 91.4 87.5 87.0  PLT 234 210 181 164 209   Cardiac Enzymes:  Recent Labs Lab 07/05/14 1725 07/06/14 0005 07/06/14 0438  TROPONINI 0.63* 0.99* 1.80*   BNP (last 3 results) No results found for this basename: PROBNP,  in the last 8760 hours CBG: No results found for this basename: GLUCAP,  in the last 168 hours  Recent Results (from the past 240 hour(s))  MRSA PCR SCREENING     Status: None   Collection Time    07/05/14  3:26 PM      Result Value Ref Range Status   MRSA by PCR NEGATIVE  NEGATIVE Final   Comment:            The GeneXpert MRSA Assay (FDA     approved for NASAL  specimens     only), is one component of a     comprehensive MRSA colonization     surveillance program. It is not     intended to diagnose MRSA     infection nor to guide or     monitor treatment for     MRSA infections.  CULTURE, BLOOD (ROUTINE X 2)     Status: None   Collection Time    07/05/14  5:20 PM      Result Value Ref Range Status   Specimen Description BLOOD RIGHT FOREARM   Final   Special Requests BOTTLES DRAWN AEROBIC ONLY 5CC  Final   Culture  Setup Time     Final   Value: 07/06/2014 00:54     Performed at [facility]   Culture     Final   Value:        BLOOD CULTURE RECEIVED NO GROWTH TO DATE CULTURE WILL BE HELD FOR 5 DAYS BEFORE ISSUING A FINAL NEGATIVE REPORT     Performed at [facility]   Report Status PENDING   Incomplete  CULTURE, BLOOD (ROUTINE X 2)     Status: None   Collection Time    07/05/14  5:25 PM      Result Value Ref Range Status   Specimen Description BLOOD LEFT ARM   Final   Special Requests BOTTLES DRAWN AEROBIC ONLY 6CC   Final   Culture  Setup Time     Final   Value: 07/06/2014 00:54     Performed at [facility]   Culture     Final   Value:        BLOOD CULTURE RECEIVED NO GROWTH TO DATE CULTURE WILL BE HELD FOR 5 DAYS BEFORE ISSUING A FINAL NEGATIVE REPORT     Performed at [facility]   Report Status PENDING   Incomplete  URINE CULTURE     Status: None   Collection Time    07/06/14  1:12 AM      Result Value Ref Range Status   Specimen Description URINE, RANDOM   Final   Special Requests NONE   Final   Culture  Setup Time     Final   Value: 07/06/2014 01:59     Performed at [facility] Count     Final   Value: >=100,000 COLONIES/ML     Performed at [facility]   Culture     Final   Value: ENTEROCOCCUS SPECIES     Performed at [facility]   Report Status 07/08/2014 FINAL   Final   Organism ID, Bacteria ENTEROCOCCUS SPECIES   Final     Studies: No results  found.  Scheduled Meds: . amLODipine  5 mg Oral Daily  . ampicillin (OMNIPEN) IV  1 g Intravenous Q12H  . aspirin EC  81 mg Oral Daily  . atorvastatin  40 mg Oral q1800  . furosemide  120 mg Intravenous BID  . ipratropium-albuterol  3 mL Nebulization TID  . isosorbide mononitrate  60 mg Oral Daily  . metoprolol tartrate  12.5 mg Oral BID  . multivitamin  1 tablet Oral QHS  . predniSONE  60 mg Oral Q breakfast   Continuous Infusions: . sodium chloride Stopped (07/09/14 1900)    Active Problems:   COPD with acute exacerbation   History of DVT (deep vein thrombosis)   STEMI (ST elevation myocardial infarction)   Acute renal failure   Tobacco abuse   HTN (hypertension)   CHF (congestive heart failure)   GERD (gastroesophageal reflux disease)   Gout   S/P IVC filter   CVA (cerebral vascular accident)   NSTEMI (non-ST elevated myocardial infarction)   Acute on chronic diastolic CHF (congestive heart failure), NYHA class 3    Time spent: >35 minutes     Esperanza Sheets  [facility] Pager [phone]. If 7PM-7AM, please contact night-coverage at [email or web address], password [email or web address] 07/10/2014, 10:15 AM  LOS: 5 days

## 2014-07-10 NOTE — Progress Notes (Signed)
Utilization review completed.  

## 2014-07-10 NOTE — Progress Notes (Signed)
Patient Name: [name] [name] Date of Encounter: 07/10/2014     Principal Problem:   STEMI (ST elevation myocardial infarction) Active Problems:   COPD with acute exacerbation   History of DVT (deep vein thrombosis)   Acute renal failure   Tobacco abuse   HTN (hypertension)   CHF (congestive heart failure)   GERD (gastroesophageal reflux disease)   Gout   S/P IVC filter   CVA (cerebral vascular accident)   NSTEMI (non-ST elevated myocardial infarction)   Acute on chronic diastolic CHF (congestive heart failure), NYHA class 3    SUBJECTIVE  1 episode of CP last night when she had some SOB. Resolved. No further complaint  CURRENT MEDS . amLODipine  5 mg Oral Daily  . ampicillin (OMNIPEN) IV  1 g Intravenous Q12H  . aspirin EC  81 mg Oral Daily  . atorvastatin  40 mg Oral q1800  . furosemide  120 mg Intravenous BID  . ipratropium-albuterol  3 mL Nebulization TID  . isosorbide mononitrate  60 mg Oral Daily  . metoprolol tartrate  12.5 mg Oral BID  . multivitamin  1 tablet Oral QHS  . predniSONE  60 mg Oral Q breakfast    OBJECTIVE  Filed Vitals:   07/09/14 2100 07/10/14 0243 07/10/14 0506 07/10/14 0843  BP: 142/50  162/55   Pulse: 77  76   Temp: 98.4 F (36.9 C)  98 F (36.7 C)   TempSrc: Oral  Oral   Resp: 18  18   Height:      Weight:   201 lb 3.2 oz (91.264 kg)   SpO2: 95% 94% 98% 94%    Intake/Output Summary (Last 24 hours) at 07/10/14 0844 Last data filed at 07/10/14 0506  Gross per 24 hour  Intake    480 ml  Output   2600 ml  Net  -2120 ml   Filed Weights   07/08/14 0500 07/09/14 0300 07/10/14 0506  Weight: 206 lb 8 oz (93.668 kg) 207 lb 1.6 oz (93.94 kg) 201 lb 3.2 oz (91.264 kg)    PHYSICAL EXAM  General: Pleasant, NAD. Neuro: Alert and oriented X 3. Moves all extremities spontaneously. Psych: Normal affect. HEENT:  Normal.  Neck: Supple without bruits or JVD.R IJ in place  Lungs:  Resp regular and unlabored, decreased breath sound in all  lung field with expiratory wheezing Heart: RRR no s3, s4. 1/6 systolic murmur Abdomen: Soft, non-tender, non-distended, BS + x 4.  Extremities: No clubbing, cyanosis or edema. DP/PT/Radials 2+ and equal bilaterally.  Accessory Clinical Findings  CBC  Recent Labs  07/09/14 0410 07/10/14 0310  WBC 11.9* 14.1*  HGB 8.1* 9.4*  HCT 23.9* 27.4*  MCV 87.5 87.0  PLT 164 209   Basic Metabolic Panel  Recent Labs  07/09/14 0410 07/10/14 0310  NA 140 143  K 3.7 4.0  CL 98 96  CO2 23 26  GLUCOSE 104* 103*  BUN 88* 106*  CREATININE 4.35* 4.71*  CALCIUM 8.2* 8.9  PHOS 7.5* 7.6*   Liver Function Tests  Recent Labs  07/09/14 0410 07/10/14 0310  ALBUMIN 2.9* 3.5    TELE  NSR with HR 60-70s, no significant ventricular ectopy  ECG  NSR with HR 70s, minimal ST changes in lateral leads  Radiology/Studies  Dg Chest Port 1 View  07/05/2014   CLINICAL DATA:  Dialysis catheter placed  EXAM: PORTABLE CHEST - 1 VIEW  COMPARISON:  07/02/2014  FINDINGS: There is a right IJ dialysis catheter, tip in  the upper right atrium or cavoatrial junction. No pneumothorax, new mediastinal widening, or pleural effusion.  Mild cardiomegaly which is stable from prior. Mild pulmonary hyperinflation. No edema. Patchy sclerosis in the right humeral head which may reflect AVN.  IMPRESSION: New right IJ dialysis catheter without adverse feature.   Electronically Signed   By: Tiburcio Pea M.D.   On: 07/05/2014 23:29    ASSESSMENT AND PLAN  1. NSTEMI  - continue to observe renal function - Echo 07/07/2014 EF 60-65%, moderate LVH, grade 1 diastolic dysfunction  - cath later depend on renal function, can potentially use lexiscan stress test to see perfusion abnormality  2. Acute on chronic renal insufficiency  - worsening Cr 4.71  3. COPD  - managed by [facility], on abx   4. tobacco abuse: quit last yr, smoked for close to 60 yrs   5. chronic diastolic heart failure   6. DVT and PE with previous IVC  filter placement   7. Hypertension   8. history of stroke   9. Anemia - monitor   Signed, Azalee Course PA-C Pager: [phone] No further chest pain. Rhythm stable. Agree with assessment and plan as noted above.  Will arrange for a lexiscan myoview for tomorrowl

## 2014-07-10 NOTE — Progress Notes (Signed)
S: Some SOB overnight. O:BP 162/55  Pulse 76  Temp(Src) 98 F (36.7 C) (Oral)  Resp 18  Ht 5\' 3"  (1.6 m)  Wt 91.264 kg (201 lb 3.2 oz)  BMI 35.65 kg/m2  SpO2 94%  Intake/Output Summary (Last 24 hours) at 07/10/14 1125 Last data filed at 07/10/14 0900  Gross per 24 hour  Intake    840 ml  Output   2600 ml  Net  -1760 ml   Weight change: -2.676 kg (-5 lb 14.4 oz) HYI:FOYDX and alert CVS:RRR Resp: decreased BS throughout Abd:+ BS NTND Ext: no edema NEURO:CNI Ox3 No asterixis Rt IJ temp HD    . amLODipine  5 mg Oral Daily  . ampicillin (OMNIPEN) IV  1 g Intravenous Q12H  . aspirin EC  81 mg Oral Daily  . atorvastatin  40 mg Oral q1800  . furosemide  120 mg Intravenous BID  . ipratropium-albuterol  3 mL Nebulization TID  . isosorbide mononitrate  60 mg Oral Daily  . metoprolol tartrate  12.5 mg Oral BID  . multivitamin  1 tablet Oral QHS  . pantoprazole  40 mg Oral Daily  . [START ON 07/11/2014] predniSONE  30 mg Oral Q breakfast   No results found. BMET    Component Value Date/Time   NA 143 07/10/2014 0310   K 4.0 07/10/2014 0310   CL 96 07/10/2014 0310   CO2 26 07/10/2014 0310   GLUCOSE 103* 07/10/2014 0310   BUN 106* 07/10/2014 0310   CREATININE 4.71* 07/10/2014 0310   CALCIUM 8.9 07/10/2014 0310   GFRNONAA 8* 07/10/2014 0310   GFRAA 10* 07/10/2014 0310   CBC    Component Value Date/Time   WBC 14.1* 07/10/2014 0310   RBC 3.15* 07/10/2014 0310   HGB 9.4* 07/10/2014 0310   HCT 27.4* 07/10/2014 0310   PLT 209 07/10/2014 0310   MCV 87.0 07/10/2014 0310   MCH 29.8 07/10/2014 0310   MCHC 34.3 07/10/2014 0310   RDW 14.6 07/10/2014 0310   LYMPHSABS 1.2 03/22/2013 1347   MONOABS 0.3 03/22/2013 1347   EOSABS 0.7 03/22/2013 1347   BASOSABS 0.0 03/22/2013 1347     Assessment: 1. 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 STEMI  3. Hx HTN 4. COPD 5. Anemia sec CKD and acute blood loss  Plan: 1. DC Lasix 2. No HD today as I think she is trying to recover based  on UO.  If Scr sig higher in AM then will probably plan HD 3. Recheck labs in AM  Jnai Snellgrove T

## 2014-07-10 NOTE — Progress Notes (Signed)
ANTIBIOTIC CONSULT NOTE - FOLLOW UP  Pharmacy Consult for ampicillin Indication: UTI  Allergies  Allergen Reactions  . Ciprofloxacin     rash    Patient Measurements: Height: 5\' 3"  (160 cm) Weight: 201 lb 3.2 oz (91.264 kg) IBW/kg (Calculated) : 52.4   Vital Signs: Temp: 98 F (36.7 C) (08/07 0506) Temp src: Oral (08/07 0506) BP: 162/55 mmHg (08/07 0506) Pulse Rate: 76 (08/07 0506) Intake/Output from previous day: 08/06 0701 - 08/07 0700 In: 836 [P.O.:720; IV Piggyback:116] Out: 2600 [Urine:2600] Intake/Output from this shift:    Labs:  Recent Labs  07/08/14 0500 07/09/14 0410 07/10/14 0310  WBC 11.3* 11.9* 14.1*  HGB 7.6* 8.1* 9.4*  PLT 181 164 209  CREATININE 3.66* 4.35* 4.71*   Estimated Creatinine Clearance: 11.8 ml/min (by C-G formula based on Cr of 4.71). No results found for this basename: VANCOTROUGH, Leodis Binet, VANCORANDOM, GENTTROUGH, GENTPEAK, GENTRANDOM, TOBRATROUGH, TOBRAPEAK, TOBRARND, AMIKACINPEAK, AMIKACINTROU, AMIKACIN,  in the last 72 hours   Microbiology: Recent Results (from the past 720 hour(s))  MRSA PCR SCREENING     Status: None   Collection Time    07/05/14  3:26 PM      Result Value Ref Range Status   MRSA by PCR NEGATIVE  NEGATIVE Final   Comment:            The GeneXpert MRSA Assay (FDA     approved for NASAL specimens     only), is one component of a     comprehensive MRSA colonization     surveillance program. It is not     intended to diagnose MRSA     infection nor to guide or     monitor treatment for     MRSA infections.  CULTURE, BLOOD (ROUTINE X 2)     Status: None   Collection Time    07/05/14  5:20 PM      Result Value Ref Range Status   Specimen Description BLOOD RIGHT FOREARM   Final   Special Requests BOTTLES DRAWN AEROBIC ONLY 5CC   Final   Culture  Setup Time     Final   Value: 07/06/2014 00:54     Performed at [facility]   Culture     Final   Value:        BLOOD CULTURE RECEIVED NO GROWTH TO  DATE CULTURE WILL BE HELD FOR 5 DAYS BEFORE ISSUING A FINAL NEGATIVE REPORT     Performed at [facility]   Report Status PENDING   Incomplete  CULTURE, BLOOD (ROUTINE X 2)     Status: None   Collection Time    07/05/14  5:25 PM      Result Value Ref Range Status   Specimen Description BLOOD LEFT ARM   Final   Special Requests BOTTLES DRAWN AEROBIC ONLY 6CC   Final   Culture  Setup Time     Final   Value: 07/06/2014 00:54     Performed at [facility]   Culture     Final   Value:        BLOOD CULTURE RECEIVED NO GROWTH TO DATE CULTURE WILL BE HELD FOR 5 DAYS BEFORE ISSUING A FINAL NEGATIVE REPORT     Performed at [facility]   Report Status PENDING   Incomplete  URINE CULTURE     Status: None   Collection Time    07/06/14  1:12 AM      Result Value Ref Range  Status   Specimen Description URINE, RANDOM   Final   Special Requests NONE   Final   Culture  Setup Time     Final   Value: 07/06/2014 01:59     [facility]   Colony Count     Final   Value: >=100,000 COLONIES/ML     [facility]   Culture     Final   Value: ENTEROCOCCUS SPECIES     [facility] at [facility]   Report Status 07/08/2014 FINAL   Final   Organism ID, Bacteria ENTEROCOCCUS SPECIES   Final    Anti-infectives   Start     Dose/Rate Route Frequency Ordered Stop   07/07/14 2000  ampicillin (OMNIPEN) 1 g in sodium chloride 0.9 % 50 mL IVPB     1 g 150 mL/hr over 20 Minutes Intravenous Every 12 hours 07/07/14 1928     07/07/14 1900  cefTRIAXone (ROCEPHIN) 1 g in dextrose 5 % 50 mL IVPB  Status:  Discontinued     1 g 100 mL/hr over 30 Minutes Intravenous Every 24 hours 07/07/14 1852 07/07/14 1852   07/06/14 1500  doxycycline (VIBRA-TABS) tablet 100 mg  Status:  Discontinued     100 mg Oral Every 12 hours 07/06/14 1410 07/07/14 1852   07/05/14 1800  azithromycin (ZITHROMAX) 250 mg in dextrose 5 % 125 mL IVPB  Status:  Discontinued     250  mg 125 mL/hr over 60 Minutes Intravenous Every 24 hours 07/05/14 1640 07/05/14 1733   07/05/14 1800  azithromycin (ZITHROMAX) tablet 500 mg     500 mg Oral  Once 07/05/14 1733 07/05/14 2018   07/05/14 1730  cefTRIAXone (ROCEPHIN) 1 g in dextrose 5 % 50 mL IVPB  Status:  Discontinued     1 g 100 mL/hr over 30 Minutes Intravenous Every 24 hours 07/05/14 1652 07/06/14 1327      Assessment: 73 yo female with enterococcal UTI on ampicillin day 3. She is noted with acute on CKD 3/4 with last HD on 8/4 (ampicillin has been dosed for HD). WBC= 14.1, afeb, SCr= 4.71.   8/2 azith x1 8/2 roceph x2 8/3 doxy>>8/4 8/4 ampicillin>>  8/3 urine- enterococcus-sens to amp 8/2 blood x2- NGTD   Plan:  -No antibiotic changes needed -Consider defining length of therapy -Will follow patient progress  Harland Germanndrew Halah Whiteside, Pharm D 07/10/2014 8:14 AM

## 2014-07-10 NOTE — Progress Notes (Signed)
VASCULAR LAB PRELIMINARY  PRELIMINARY  PRELIMINARY  PRELIMINARY  Bilateral lower extremity venous duplex  completed.    Preliminary report:  Bilateral:  No evidence of DVT, superficial thrombosis, or Baker's Cyst.    Faatimah Spielberg, RVT 07/10/2014, 12:11 PM

## 2014-07-11 ENCOUNTER — Inpatient Hospital Stay (HOSPITAL_COMMUNITY): Payer: Medicare Other

## 2014-07-11 DIAGNOSIS — R079 Chest pain, unspecified: Secondary | ICD-10-CM

## 2014-07-11 LAB — BASIC METABOLIC PANEL
Anion gap: 18 — ABNORMAL HIGH (ref 5–15)
BUN: 113 mg/dL — ABNORMAL HIGH (ref 6–23)
CHLORIDE: 99 meq/L (ref 96–112)
CO2: 28 meq/L (ref 19–32)
CREATININE: 4.21 mg/dL — AB (ref 0.50–1.10)
Calcium: 8.5 mg/dL (ref 8.4–10.5)
GFR calc non Af Amer: 10 mL/min — ABNORMAL LOW (ref >90)
GFR, EST AFRICAN AMERICAN: 11 mL/min — AB (ref >90)
Glucose, Bld: 97 mg/dL (ref 70–99)
Potassium: 3.7 mEq/L (ref 3.7–5.3)
Sodium: 145 mEq/L (ref 137–147)

## 2014-07-11 LAB — CBC
HCT: 25.5 % — ABNORMAL LOW (ref 36.0–46.0)
Hemoglobin: 8.6 g/dL — ABNORMAL LOW (ref 12.0–15.0)
MCH: 29.7 pg (ref 26.0–34.0)
MCHC: 33.7 g/dL (ref 30.0–36.0)
MCV: 87.9 fL (ref 78.0–100.0)
Platelets: 185 10*3/uL (ref 150–400)
RBC: 2.9 MIL/uL — ABNORMAL LOW (ref 3.87–5.11)
RDW: 14.6 % (ref 11.5–15.5)
WBC: 14.1 10*3/uL — AB (ref 4.0–10.5)

## 2014-07-11 MED ORDER — REGADENOSON 0.4 MG/5ML IV SOLN
INTRAVENOUS | Status: AC
  Administered 2014-07-11: 0.4 mg via INTRAVENOUS
  Filled 2014-07-11: qty 5

## 2014-07-11 MED ORDER — AMLODIPINE BESYLATE 10 MG PO TABS
10.0000 mg | ORAL_TABLET | Freq: Every day | ORAL | Status: DC
  Administered 2014-07-11 – 2014-07-12 (×2): 10 mg via ORAL
  Filled 2014-07-11 (×3): qty 1

## 2014-07-11 MED ORDER — TECHNETIUM TC 99M SESTAMIBI GENERIC - CARDIOLITE
30.0000 | Freq: Once | INTRAVENOUS | Status: AC | PRN
  Administered 2014-07-11: 30 via INTRAVENOUS

## 2014-07-11 MED ORDER — TECHNETIUM TC 99M SESTAMIBI GENERIC - CARDIOLITE
10.0000 | Freq: Once | INTRAVENOUS | Status: AC | PRN
  Administered 2014-07-11: 10 via INTRAVENOUS

## 2014-07-11 MED ORDER — SENNOSIDES-DOCUSATE SODIUM 8.6-50 MG PO TABS
1.0000 | ORAL_TABLET | Freq: Two times a day (BID) | ORAL | Status: DC
  Administered 2014-07-11 – 2014-07-13 (×4): 1 via ORAL
  Filled 2014-07-11 (×6): qty 1

## 2014-07-11 MED ORDER — BISACODYL 10 MG RE SUPP: 10.0000 mg | Freq: Every day | RECTAL | Status: DC | PRN

## 2014-07-11 MED ORDER — METOPROLOL TARTRATE 12.5 MG HALF TABLET
12.5000 mg | ORAL_TABLET | Freq: Two times a day (BID) | ORAL | Status: DC
  Administered 2014-07-11: 12.5 mg via ORAL
  Filled 2014-07-11 (×3): qty 1

## 2014-07-11 MED ORDER — REGADENOSON 0.4 MG/5ML IV SOLN
0.4000 mg | Freq: Once | INTRAVENOUS | Status: AC
  Administered 2014-07-11: 0.4 mg via INTRAVENOUS
  Filled 2014-07-11: qty 5

## 2014-07-11 MED ORDER — METOPROLOL TARTRATE 25 MG PO TABS
25.0000 mg | ORAL_TABLET | Freq: Two times a day (BID) | ORAL | Status: DC
  Administered 2014-07-11 – 2014-07-12 (×3): 25 mg via ORAL
  Filled 2014-07-11 (×5): qty 1

## 2014-07-11 NOTE — Progress Notes (Signed)
Patient Name: [name] [name] Date of Encounter: 07/11/2014     Principal Problem:   NSTEMI (non-ST elevated myocardial infarction) Active Problems:   COPD with acute exacerbation   History of DVT (deep vein thrombosis)   STEMI (ST elevation myocardial infarction)   Acute renal failure   Tobacco abuse   HTN (hypertension)   CHF (congestive heart failure)   GERD (gastroesophageal reflux disease)   Gout   S/P IVC filter   CVA (cerebral vascular accident)   Acute on chronic diastolic CHF (congestive heart failure), NYHA class 3    SUBJECTIVE  The patient has not had any chest discomfort overnight.  She is awaiting Lexi scan Myoview this morning.  Rhythm is normal sinus rhythm.  She has diuresed well overnight.  Weight is down 5 pounds since admission.  CURRENT MEDS . amLODipine  5 mg Oral Daily  . ampicillin (OMNIPEN) IV  1 g Intravenous Q12H  . aspirin EC  81 mg Oral Daily  . atorvastatin  40 mg Oral q1800  . ipratropium-albuterol  3 mL Nebulization TID  . isosorbide mononitrate  60 mg Oral Daily  . metoprolol tartrate  12.5 mg Oral BID  . multivitamin  1 tablet Oral QHS  . pantoprazole  40 mg Oral Daily  . predniSONE  30 mg Oral Q breakfast    OBJECTIVE  Filed Vitals:   07/10/14 2020 07/10/14 2028 07/11/14 0432 07/11/14 0645  BP: 165/68  178/69 185/78  Pulse: 86  72   Temp: 98.2 F (36.8 C)  98 F (36.7 C)   TempSrc:      Resp: 18  18   Height:      Weight:   202 lb 6.4 oz (91.808 kg)   SpO2: 96% 96% 96%     Intake/Output Summary (Last 24 hours) at 07/11/14 0822 Last data filed at 07/11/14 0538  Gross per 24 hour  Intake    960 ml  Output   3526 ml  Net  -2566 ml   Filed Weights   07/09/14 0300 07/10/14 0506 07/11/14 0432  Weight: 207 lb 1.6 oz (93.94 kg) 201 lb 3.2 oz (91.264 kg) 202 lb 6.4 oz (91.808 kg)    PHYSICAL EXAM  General: Pleasant, NAD. Neuro: Alert and oriented X 3. Moves all extremities spontaneously. Psych: Normal affect. HEENT:   Normal  Neck: Supple without bruits or JVD. Lungs:  Resp regular and unlabored, mild expiratory wheeze. Heart: RRR no s3, s4, and that there is a soft systolic ejection murmur at the left sternal edge. Abdomen: Soft, non-tender, non-distended, BS + x 4.  Extremities: Trace edema. DP/PT/Radials 2+ and equal bilaterally.  Accessory Clinical Findings  CBC  Recent Labs  07/10/14 0310 07/11/14 0532  WBC 14.1* 14.1*  HGB 9.4* 8.6*  HCT 27.4* 25.5*  MCV 87.0 87.9  PLT 209 185   Basic Metabolic Panel  Recent Labs  07/09/14 0410 07/10/14 0310 07/11/14 0532  NA 140 143 145  K 3.7 4.0 3.7  CL 98 96 99  CO2 23 26 28   GLUCOSE 104* 103* 97  BUN 88* 106* 113*  CREATININE 4.35* 4.71* 4.21*  CALCIUM 8.2* 8.9 8.5  PHOS 7.5* 7.6*  --    Liver Function Tests  Recent Labs  07/09/14 0410 07/10/14 0310  ALBUMIN 2.9* 3.5   No results found for this basename: LIPASE, AMYLASE,  in the last 72 hours Cardiac Enzymes No results found for this basename: CKTOTAL, CKMB, CKMBINDEX, TROPONINI,  in the last 72  hours BNP No components found with this basename: POCBNP,  D-Dimer No results found for this basename: DDIMER,  in the last 72 hours Hemoglobin A1C No results found for this basename: HGBA1C,  in the last 72 hours Fasting Lipid Panel No results found for this basename: CHOL, HDL, LDLCALC, TRIG, CHOLHDL, LDLDIRECT,  in the last 72 hours Thyroid Function Tests No results found for this basename: TSH, T4TOTAL, FREET3, T3FREE, THYROIDAB,  in the last 72 hours  TELE  Normal sinus rhythm  ECG    Radiology/Studies  Dg Chest Port 1 View  07/05/2014   CLINICAL DATA:  Dialysis catheter placed  EXAM: PORTABLE CHEST - 1 VIEW  COMPARISON:  07/02/2014  FINDINGS: There is a right IJ dialysis catheter, tip in the upper right atrium or cavoatrial junction. No pneumothorax, new mediastinal widening, or pleural effusion.  Mild cardiomegaly which is stable from prior. Mild pulmonary  hyperinflation. No edema. Patchy sclerosis in the right humeral head which may reflect AVN.  IMPRESSION: New right IJ dialysis catheter without adverse feature.   Electronically Signed   By: Tiburcio Pea M.D.   On: 07/05/2014 23:29    ASSESSMENT AND PLAN 1. NSTEMI  - continue to observe renal function  - Echo 07/07/2014 EF 60-65%, moderate LVH, grade 1 diastolic dysfunction  Lexa scan Myoview pending today 2. Acute on chronic renal insufficiency  - Creatinine 4.21 slightly improved  3. COPD  - managed by [facility], on abx  4. tobacco abuse: quit last yr, smoked for close to 60 yrs  5. chronic diastolic heart failure  6. 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 Hypertension  8. history of stroke  9. Anemia - monitor     Signed, Cassell Clement MD

## 2014-07-11 NOTE — Progress Notes (Signed)
S: .no new CO.  Had stress test this AM O:BP 185/78  Pulse 72  Temp(Src) 98 F (36.7 C) (Oral)  Resp 18  Ht 5\' 3"  (1.6 m)  Wt 91.808 kg (202 lb 6.4 oz)  BMI 35.86 kg/m2  SpO2 96%  Intake/Output Summary (Last 24 hours) at 07/11/14 0842 Last data filed at 07/11/14 0538  Gross per 24 hour  Intake    960 ml  Output   3526 ml  Net  -2566 ml   Weight change: 0.544 kg (1 lb 3.2 oz) LTJ:QZESP and alert CVS:RRR Resp: decreased BS throughout Abd:+ BS NTND Ext: no edema NEURO:CNI Ox3 No asterixis Rt IJ temp HD    . amLODipine  5 mg Oral Daily  . ampicillin (OMNIPEN) IV  1 g Intravenous Q12H  . aspirin EC  81 mg Oral Daily  . atorvastatin  40 mg Oral q1800  . ipratropium-albuterol  3 mL Nebulization TID  . isosorbide mononitrate  60 mg Oral Daily  . metoprolol tartrate  12.5 mg Oral BID  . multivitamin  1 tablet Oral QHS  . pantoprazole  40 mg Oral Daily  . predniSONE  30 mg Oral Q breakfast   No results found. BMET    Component Value Date/Time   NA 145 07/11/2014 0532   K 3.7 07/11/2014 0532   CL 99 07/11/2014 0532   CO2 28 07/11/2014 0532   GLUCOSE 97 07/11/2014 0532   BUN 113* 07/11/2014 0532   CREATININE 4.21* 07/11/2014 0532   CALCIUM 8.5 07/11/2014 0532   GFRNONAA 10* 07/11/2014 0532   GFRAA 11* 07/11/2014 0532   CBC    Component Value Date/Time   WBC 14.1* 07/11/2014 0532   RBC 2.90* 07/11/2014 0532   HGB 8.6* 07/11/2014 0532   HCT 25.5* 07/11/2014 0532   PLT 185 07/11/2014 0532   MCV 87.9 07/11/2014 0532   MCH 29.7 07/11/2014 0532   MCHC 33.7 07/11/2014 0532   RDW 14.6 07/11/2014 0532   LYMPHSABS 1.2 03/22/2013 1347   MONOABS 0.3 03/22/2013 1347   EOSABS 0.7 03/22/2013 1347   BASOSABS 0.0 03/22/2013 1347     Assessment: 1. 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  UO excellent even off lasix 2. STEMI  3. Hx HTN 4. COPD 5. Anemia sec CKD and acute blood loss  Plan: 1. Increase amlodipine to 10mg  q d.  BP high and she was on this much PTA.  2.   Hopefully renal fx will cont to improve back to her baseline  Jamison Yuhasz T

## 2014-07-11 NOTE — Progress Notes (Signed)
[facility] PROGRESS NOTE  [name] [name] ZOX:[id number] DOB: 1941-01-25 DOA: 07/05/2014 PCP: No primary provider on file.  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 Despite stopping Lasix and ACE I her renal function progressively worsened to a peak of ~7 (baseline of 1.2-2.5).  -After arrival to [facility]phrology was consulted and emergent HD was initiated. Cardiac enzymes were also checked and were elevated in the absence of EKG changes. Cardiology was consulted. ECHO was WNL except for grade 1 diastolic dysfunction. IV Heparin was started. She stabilized and was able to transfer out to SDU.   1. Acute renal failure on CKDIII s/p HD x1 per nephrology; off HD: making urine;   2. COPD with acute exacerbation; CXR : no clear infiltrates; improved; no wheezing on exam   -cont nebs, bronchodilators; oxygen, doxycycline prednisone-taper  3. Hx DVT/PE Hx IVC filter placement; per patient competed [facility] treatment last year  -Heparin drip for presumed cardiac ischemia but had to dc 8/5 due to excessive bleeding around HD catheter  -already on SCDs; repeat LE [place]: no DVT  4. NSTEMI; chest pain resolved  -dc Heparin drip due to bleeding; cont Imdur,ASA and statin;added BB -8/8: stress test: No evidence for pharmacological induced ischemia.  Calculated ejection fraction is 64% 5. Acute on chronic diastolic CHF  -Echocardiogram WNL except for grade 1 diastolic dysfunction; cont diuresis per nephrology  6. HTN increased Amlodipine, BB; cont Clonidine and Imdur,  7. UTI Enterococcus resistant to Levaquin; afebrile;  -Sensitive to Ampicillin  8. Tobacco abuse Discussed abstinence  9. Hx CVA Neurologically intact  10. Anemia, AoCD, with episode of blood loss; TFsed 8/5; Hg  stable; off heparin; cont monitor TF prn     Consultants:  Dr. York Ram (cardiology)  Dr. Primitivo Gauze (nephrology)  Dr. Evalyn Casco (P[facility])   Procedure/Significant Events:  Echocardiogram  - Left ventricle: The cavity size was normal. There was moderate concentric hypertrophy. Systolic function was normal. The estimated ejection fraction was in the range of 60% to 65%. Wall motion was normal; there were no regional wall motion abnormalities. Doppler parameters are consistent with abnormal left ventricular relaxation (grade 1 diastolic dysfunction). There was no evidence of elevated ventricular filling pressure by Doppler parameters. - Aortic valve: Trileaflet; normal thickness leaflets. Transvalvular velocity was within the normal range. There was no stenosis. There was no regurgitation. - Aortic root: The aortic root was normal in size. - Mitral valve: Structurally normal valve. There was no regurgitation. - Right ventricle: Systolic function was normal. - Right atrium: The atrium was normal in size. - Tricuspid valve: There was no regurgitation. - Pulmonary arteries: Systolic pressure was within the normal range.  Culture  8/2 blood right forearm/left arm NGTD  8/2 MRSA by PCR negative  8/3 urine positive enterococcus  Antibiotics:  Doxycycline 8/3>> stopped 8/4  Ampicillin 8./4>>   Code Status: full Family Communication: d/w patient (indicate person spoken with, relationship, and if by phone, the number) Disposition Plan: pend clinical improvement   HPI/Subjective: alert  Objective: Filed Vitals:   07/11/14 1000  BP: 166/40  Pulse: 82  Temp:   Resp:     Intake/Output Summary (Last 24 hours) at 07/11/14 1353 Last data filed at 07/11/14 0859  Gross per 24 hour  Intake    360 ml  Output  2851 ml  Net  -2491 ml   Filed Weights   07/09/14 0300 07/10/14 0506 07/11/14 0432  Weight: 93.94 kg (207 lb 1.6 oz) 91.264 kg (201 lb 3.2 oz) 91.808 kg  (202 lb 6.4 oz)    Exam:   General:  Alert, oriented   Cardiovascular: s1,s2 rrr  Respiratory: few crackles   Abdomen: soft, nt,nd   Musculoskeletal: mild edema   Data Reviewed: Basic Metabolic Panel:  Recent Labs Lab 07/05/14 1725  07/07/14 0745 07/08/14 0500 07/09/14 0410 07/10/14 0310 07/11/14 0532  NA 139  < > 144 142 140 143 145  K 6.1*  < > 3.6* 3.7 3.7 4.0 3.7  CL 104  < > 102 100 98 96 99  CO2 19  < > 25 26 23 26 28   GLUCOSE 108*  < > 109* 109* 104* 103* 97  BUN 117*  < > 76* 63* 88* 106* 113*  CREATININE 6.41*  < > 4.24* 3.66* 4.35* 4.71* 4.21*  CALCIUM 8.0*  < > 7.7* 8.0* 8.2* 8.9 8.5  MG 2.5  --   --   --   --   --   --   PHOS 7.6*  --  7.9* 7.4* 7.5* 7.6*  --   < > = values in this interval not displayed. Liver Function Tests:  Recent Labs Lab 07/05/14 1725 07/07/14 0745 07/08/14 0500 07/09/14 0410 07/10/14 0310  AST 20  --   --   --   --   ALT 21  --   --   --   --   ALKPHOS 89  --   --   --   --   BILITOT <0.2*  --   --   --   --   PROT 5.5*  --   --   --   --   ALBUMIN 3.3* 3.0* 2.9* 2.9* 3.5   No results found for this basename: LIPASE, AMYLASE,  in the last 168 hours No results found for this basename: AMMONIA,  in the last 168 hours CBC:  Recent Labs Lab 07/07/14 0325 07/08/14 0500 07/09/14 0410 07/10/14 0310 07/11/14 0532  WBC 12.2* 11.3* 11.9* 14.1* 14.1*  HGB 8.0* 7.6* 8.1* 9.4* 8.6*  HCT 24.6* 23.4* 23.9* 27.4* 25.5*  MCV 90.1 91.4 87.5 87.0 87.9  PLT 210 181 164 209 185   Cardiac Enzymes:  Recent Labs Lab 07/05/14 1725 07/06/14 0005 07/06/14 0438  TROPONINI 0.63* 0.99* 1.80*   BNP (last 3 results) No results found for this basename: PROBNP,  in the last 8760 hours CBG: No results found for this basename: GLUCAP,  in the last 168 hours  Recent Results (from the past 240 hour(s))  MRSA PCR SCREENING     Status: None   Collection Time    07/05/14  3:26 PM      Result Value Ref Range Status   MRSA by PCR  NEGATIVE  NEGATIVE Final   Comment:            The GeneXpert MRSA Assay (FDA     approved for NASAL specimens     only), is one component of a     comprehensive MRSA colonization     surveillance program. It is not     intended to diagnose MRSA     infection nor to guide or     monitor treatment for     MRSA infections.  CULTURE, BLOOD (ROUTINE X 2)     Status: None  Collection Time    07/05/14  5:20 PM      Result Value Ref Range Status   Specimen Description BLOOD RIGHT FOREARM   Final   Special Requests BOTTLES DRAWN AEROBIC ONLY 5CC   Final   Culture  Setup Time     Final   Value: 07/06/2014 00:54     Performed at [facility]   Culture     Final   Value:        BLOOD CULTURE RECEIVED NO GROWTH TO DATE CULTURE WILL BE HELD FOR 5 DAYS BEFORE ISSUING A FINAL NEGATIVE REPORT     Performed at [facility]   Report Status PENDING   Incomplete  CULTURE, BLOOD (ROUTINE X 2)     Status: None   Collection Time    07/05/14  5:25 PM      Result Value Ref Range Status   Specimen Description BLOOD LEFT ARM   Final   Special Requests BOTTLES DRAWN AEROBIC ONLY 6CC   Final   Culture  Setup Time     Final   Value: 07/06/2014 00:54     Performed at [facility]   Culture     Final   Value:        BLOOD CULTURE RECEIVED NO GROWTH TO DATE CULTURE WILL BE HELD FOR 5 DAYS BEFORE ISSUING A FINAL NEGATIVE REPORT     Performed at [facility]   Report Status PENDING   Incomplete  URINE CULTURE     Status: None   Collection Time    07/06/14  1:12 AM      Result Value Ref Range Status   Specimen Description URINE, RANDOM   Final   Special Requests NONE   Final   Culture  Setup Time     Final   Value: 07/06/2014 01:59     Performed at [facility] Count     Final   Value: >=100,000 COLONIES/ML     Performed at [facility]   Culture     Final   Value: ENTEROCOCCUS SPECIES     Performed at [facility]   Report Status  07/08/2014 FINAL   Final   Organism ID, Bacteria ENTEROCOCCUS SPECIES   Final     Studies: Nm Myocar Multi W/spect W/wall Motion / Ef  07/11/2014   CLINICAL DATA:  73 year old with chest pain.  EXAM: MYOCARDIAL IMAGING WITH SPECT (REST AND PHARMACOLOGIC-STRESS)  GATED LEFT VENTRICULAR WALL MOTION STUDY  LEFT VENTRICULAR EJECTION FRACTION  TECHNIQUE: Standard myocardial SPECT imaging performed after resting intravenous injection of 10 mCi Tc-17m sestimibi. Subsequently, intravenous infusion of regadenoson performed under the supervision of the Cardiology staff. At peak effect of the drug, 30 mCi Tc-58m sestimibi injected intravenously and standard myocardial SPECT imaging performed. Quantitative gated imaging also performed to evaluate left ventricular wall motion and estimate left ventricular ejection fraction.  FINDINGS: MYOCARDIAL IMAGING WITH SPECT (REST AND PHARMACOLOGIC-STRESS)  There is no evidence for reversibility. There is slightly decreased uptake along the inferior wall on both the stress and rest images. Normal wall motion in the inferior wall and suspect diaphragmatic attenuation.  GATED LEFT VENTRICULAR WALL MOTION STUDY  Review of the gated images demonstrates normal wall motion.  LEFT VENTRICULAR EJECTION FRACTION  QGS ejection fraction measures 64% , with an end-diastolic volume of 90 ml and an end-systolic volume of 32 ml.  IMPRESSION: No evidence for pharmacological induced ischemia.  Calculated ejection fraction  is 64%   Electronically Signed   By: Richarda Overlie M.D.   On: 07/11/2014 12:24    Scheduled Meds: . amLODipine  10 mg Oral Daily  . ampicillin (OMNIPEN) IV  1 g Intravenous Q12H  . aspirin EC  81 mg Oral Daily  . atorvastatin  40 mg Oral q1800  . ipratropium-albuterol  3 mL Nebulization TID  . isosorbide mononitrate  60 mg Oral Daily  . metoprolol tartrate  12.5 mg Oral BID  . multivitamin  1 tablet Oral QHS  . pantoprazole  40 mg Oral Daily  . predniSONE  30 mg Oral Q  breakfast   Continuous Infusions: . sodium chloride Stopped (07/09/14 1900)    Principal Problem:   NSTEMI (non-ST elevated myocardial infarction) Active Problems:   COPD with acute exacerbation   History of DVT (deep vein thrombosis)   STEMI (ST elevation myocardial infarction)   Acute renal failure   Tobacco abuse   HTN (hypertension)   CHF (congestive heart failure)   GERD (gastroesophageal reflux disease)   Gout   S/P IVC filter   CVA (cerebral vascular accident)   Acute on chronic diastolic CHF (congestive heart failure), NYHA class 3    Time spent: >35 minutes     Esperanza Sheets  [facility] Pager [phone]. If 7PM-7AM, please contact night-coverage at [email or web address], password [facility] 07/11/2014, 1:53 PM  LOS: 6 days

## 2014-07-11 NOTE — Progress Notes (Signed)
Pre stress test vitals

## 2014-07-11 NOTE — Progress Notes (Signed)
lexiscan injected with Lyman Bishop NP at bedside

## 2014-07-11 NOTE — Progress Notes (Signed)
End of test

## 2014-07-12 LAB — BASIC METABOLIC PANEL
Anion gap: 17 — ABNORMAL HIGH (ref 5–15)
BUN: 103 mg/dL — AB (ref 6–23)
CALCIUM: 7.8 mg/dL — AB (ref 8.4–10.5)
CO2: 27 mEq/L (ref 19–32)
Chloride: 102 mEq/L (ref 96–112)
Creatinine, Ser: 3.43 mg/dL — ABNORMAL HIGH (ref 0.50–1.10)
GFR calc Af Amer: 14 mL/min — ABNORMAL LOW (ref >90)
GFR, EST NON AFRICAN AMERICAN: 12 mL/min — AB (ref >90)
GLUCOSE: 107 mg/dL — AB (ref 70–99)
Potassium: 3.9 mEq/L (ref 3.7–5.3)
Sodium: 146 mEq/L (ref 137–147)

## 2014-07-12 LAB — CBC
HEMATOCRIT: 23.7 % — AB (ref 36.0–46.0)
HEMOGLOBIN: 7.9 g/dL — AB (ref 12.0–15.0)
MCH: 29.7 pg (ref 26.0–34.0)
MCHC: 33.3 g/dL (ref 30.0–36.0)
MCV: 89.1 fL (ref 78.0–100.0)
Platelets: 167 10*3/uL (ref 150–400)
RBC: 2.66 MIL/uL — ABNORMAL LOW (ref 3.87–5.11)
RDW: 14.5 % (ref 11.5–15.5)
WBC: 11.5 10*3/uL — ABNORMAL HIGH (ref 4.0–10.5)

## 2014-07-12 LAB — CULTURE, BLOOD (ROUTINE X 2)
Culture: NO GROWTH
Culture: NO GROWTH

## 2014-07-12 MED ORDER — HYDRALAZINE HCL 25 MG PO TABS
25.0000 mg | ORAL_TABLET | Freq: Three times a day (TID) | ORAL | Status: DC
  Administered 2014-07-12 – 2014-07-13 (×3): 25 mg via ORAL
  Filled 2014-07-12 (×6): qty 1

## 2014-07-12 MED ORDER — FUROSEMIDE 40 MG PO TABS
40.0000 mg | ORAL_TABLET | Freq: Every day | ORAL | Status: DC
  Administered 2014-07-12: 40 mg via ORAL
  Filled 2014-07-12 (×2): qty 1

## 2014-07-12 MED ORDER — PREDNISONE 20 MG PO TABS
20.0000 mg | ORAL_TABLET | Freq: Every day | ORAL | Status: DC
  Administered 2014-07-13: 20 mg via ORAL
  Filled 2014-07-12 (×2): qty 1

## 2014-07-12 MED ORDER — HYDROCODONE-ACETAMINOPHEN 5-325 MG PO TABS
1.0000 | ORAL_TABLET | Freq: Four times a day (QID) | ORAL | Status: DC | PRN
  Administered 2014-07-12: 1 via ORAL
  Filled 2014-07-12: qty 1

## 2014-07-12 NOTE — Progress Notes (Deleted)
CRITICAL VALUE ALERT  Critical value received:  CO2 44  Date of notification:  07/12/2014  Time of notification:  0610  Critical value read back:Yes.    Nurse who received alert:  Doug Sou, RN  MD notified (1st page):  M. Burnadette Peter, NP  Time of first page:  [phone]  MD notified (2nd page):  Time of second page:  Responding MD:  M. Burnadette Peter, NP  Time MD responded:  [phone]  Pt. Asymptomatic, VSS.  No new orders received.  Will continue to monitor patient closely.

## 2014-07-12 NOTE — Progress Notes (Signed)
[facility] PROGRESS NOTE  [name] [name] ZOX:[id number] DOB: 05/17/1941 DOA: 07/05/2014 PCP: No primary provider on file.  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 Despite stopping Lasix and ACE I her renal function progressively worsened to a peak of ~7 (baseline of 1.2-2.5).  -[facility] and emergent HD was initiated. Cardiac enzymes were also checked and were elevated in the absence of EKG changes. Cardiology was consulted. ECHO was WNL except for grade 1 diastolic dysfunction. IV Heparin was started. She stabilized and was able to transfer out to SDU.   1. Acute renal failure on CKDIII s/p HD x1 per nephrology; off HD: making urine; improving    2. COPD with acute exacerbation; CXR : no clear infiltrates; improved; no wheezing on exam   -cont nebs, bronchodilators; oxygen, doxycycline prednisone-taper  3. Hx DVT/PE Hx IVC filter placement; [facility]  -Heparin drip for presumed cardiac ischemia but had to dc 8/5 due to excessive bleeding around HD catheter  -already on SCDs; repeat LE US: no DVT  4. NSTEMI; chest pain resolved  -dc Heparin drip due to bleeding; cont Imdur,ASA and statin;added BB -8/8: stress test: No evidence for pharmacological induced ischemia.  Calculated ejection fraction is 64% 5. Acute on chronic diastolic CHF  -Echocardiogram WNL except for grade 1 diastolic dysfunction; cont diuresis per nephrology  6. HTN increased Amlodipine, BB; cont Clonidine and Imdur, added hydralazine   7. UTI Enterococcus resistant to Levaquin; afebrile;  -Sensitive to Ampicillin  8. Tobacco abuse Discussed abstinence  9. Hx CVA Neurologically intact  10. Anemia, AoCD, with  episode of blood loss; TFsed 8/5; Hg stable; off heparin; cont monitor TF prn     Consultants:  Dr. York RamJonathan Barry (cardiology)  Dr. Primitivo GauzeMichael Mattingly (nephrology)  Dr. Evalyn CascoAdam Belanger (PCCM)   Procedure/Significant Events:  Echocardiogram  - Left ventricle: The cavity size was normal. There was moderate concentric hypertrophy. Systolic function was normal. The estimated ejection fraction was in the range of 60% to 65%. Wall motion was normal; there were no regional wall motion abnormalities. Doppler parameters are consistent with abnormal left ventricular relaxation (grade 1 diastolic dysfunction). There was no evidence of elevated ventricular filling pressure by Doppler parameters. - Aortic valve: Trileaflet; normal thickness leaflets. Transvalvular velocity was within the normal range. There was no stenosis. There was no regurgitation. - Aortic root: The aortic root was normal in size. - Mitral valve: Structurally normal valve. There was no regurgitation. - Right ventricle: Systolic function was normal. - Right atrium: The atrium was normal in size. - Tricuspid valve: There was no regurgitation. - Pulmonary arteries: Systolic pressure was within the normal range.  Culture  8/2 blood right forearm/left arm NGTD  8/2 MRSA by PCR negative  8/3 urine positive enterococcus  Antibiotics:  Doxycycline 8/3>> stopped 8/4  Ampicillin 8./4>>   Code Status: full Family Communication: d/w patient (indicate person spoken with, relationship, and if by phone, the number) Disposition Plan: pend clinical improvement   HPI/Subjective: alert  Objective: Filed Vitals:   07/12/14 0542  BP: 149/81  Pulse: 73  Temp: 97.9 F (36.6 C)  Resp: 16    Intake/Output Summary (Last 24 hours) at 07/12/14 0944 Last data filed at 07/12/14 0903  Gross per 24 hour  Intake    250 ml  Output   1650 ml  Net  -1400 ml   Filed Weights   07/10/14 0506 07/11/14 0432 07/12/14 0542  Weight: 91.264  kg (201 lb 3.2 oz) 91.808 kg (202 lb 6.4 oz) 89.495 kg (197 lb 4.8 oz)    Exam:   General:  Alert, oriented   Cardiovascular: s1,s2 rrr  Respiratory: few crackles   Abdomen: soft, nt,nd   Musculoskeletal: mild edema   Data Reviewed: Basic Metabolic Panel:  Recent Labs Lab 07/05/14 1725  07/07/14 0745 07/08/14 0500 07/09/14 0410 07/10/14 0310 07/11/14 0532 07/12/14 0515  NA 139  < > 144 142 140 143 145 146  K 6.1*  < > 3.6* 3.7 3.7 4.0 3.7 3.9  CL 104  < > 102 100 98 96 99 102  CO2 19  < > 25 26 23 26 28 27   GLUCOSE 108*  < > 109* 109* 104* 103* 97 107*  BUN 117*  < > 76* 63* 88* 106* 113* 103*  CREATININE 6.41*  < > 4.24* 3.66* 4.35* 4.71* 4.21* 3.43*  CALCIUM 8.0*  < > 7.7* 8.0* 8.2* 8.9 8.5 7.8*  MG 2.5  --   --   --   --   --   --   --   PHOS 7.6*  --  7.9* 7.4* 7.5* 7.6*  --   --   < > = values in this interval not displayed. Liver Function Tests:  Recent Labs Lab 07/05/14 1725 07/07/14 0745 07/08/14 0500 07/09/14 0410 07/10/14 0310  AST 20  --   --   --   --   ALT 21  --   --   --   --   ALKPHOS 89  --   --   --   --   BILITOT <0.2*  --   --   --   --   PROT 5.5*  --   --   --   --   ALBUMIN 3.3* 3.0* 2.9* 2.9* 3.5   No results found for this basename: LIPASE, AMYLASE,  in the last 168 hours No results found for this basename: AMMONIA,  in the last 168 hours CBC:  Recent Labs Lab 07/08/14 0500 07/09/14 0410 07/10/14 0310 07/11/14 0532 07/12/14 0515  WBC 11.3* 11.9* 14.1* 14.1* 11.5*  HGB 7.6* 8.1* 9.4* 8.6* 7.9*  HCT 23.4* 23.9* 27.4* 25.5* 23.7*  MCV 91.4 87.5 87.0 87.9 89.1  PLT 181 164 209 185 167   Cardiac Enzymes:  Recent Labs Lab 07/05/14 1725 07/06/14 0005 07/06/14 0438  TROPONINI 0.63* 0.99* 1.80*   BNP (last 3 results) No results found for this basename: PROBNP,  in the last 8760 hours CBG: No results found for this basename: GLUCAP,  in the last 168 hours  Recent Results (from the past 240 hour(s))  MRSA PCR  SCREENING     Status: None   Collection Time    07/05/14  3:26 PM      Result Value Ref Range Status   MRSA by PCR NEGATIVE  NEGATIVE Final   Comment:            The GeneXpert MRSA Assay (FDA     approved for NASAL specimens     only), is one component of a     comprehensive MRSA colonization     surveillance program. It is not     intended to diagnose MRSA     infection nor to guide or  monitor treatment for     MRSA infections.  CULTURE, BLOOD (ROUTINE X 2)     Status: None   Collection Time    07/05/14  5:20 PM      Result Value Ref Range Status   Specimen Description BLOOD RIGHT FOREARM   Final   Special Requests BOTTLES DRAWN AEROBIC ONLY 5CC   Final   Culture  Setup Time     Final   Value: 07/06/2014 00:54     Performed at [facility]   Culture     Final   Value:        BLOOD CULTURE RECEIVED NO GROWTH TO DATE CULTURE WILL BE HELD FOR 5 DAYS BEFORE ISSUING A FINAL NEGATIVE REPORT     Performed at [facility]   Report Status PENDING   Incomplete  CULTURE, BLOOD (ROUTINE X 2)     Status: None   Collection Time    07/05/14  5:25 PM      Result Value Ref Range Status   Specimen Description BLOOD LEFT ARM   Final   Special Requests BOTTLES DRAWN AEROBIC ONLY 6CC   Final   Culture  Setup Time     Final   Value: 07/06/2014 00:54     Performed at [facility]   Culture     Final   Value:        BLOOD CULTURE RECEIVED NO GROWTH TO DATE CULTURE WILL BE HELD FOR 5 DAYS BEFORE ISSUING A FINAL NEGATIVE REPORT     Performed at [facility]   Report Status PENDING   Incomplete  URINE CULTURE     Status: None   Collection Time    07/06/14  1:12 AM      Result Value Ref Range Status   Specimen Description URINE, RANDOM   Final   Special Requests NONE   Final   Culture  Setup Time     Final   Value: 07/06/2014 01:59     Performed at [facility] Count     Final   Value: >=100,000 COLONIES/ML     Performed at [facility]   Culture     Final   Value: ENTEROCOCCUS SPECIES     Performed at [facility]   Report Status 07/08/2014 FINAL   Final   Organism ID, Bacteria ENTEROCOCCUS SPECIES   Final     Studies: Nm Myocar Multi W/spect W/wall Motion / Ef  07/11/2014   CLINICAL DATA:  73 year old with chest pain.  EXAM: MYOCARDIAL IMAGING WITH SPECT (REST AND PHARMACOLOGIC-STRESS)  GATED LEFT VENTRICULAR WALL MOTION STUDY  LEFT VENTRICULAR EJECTION FRACTION  TECHNIQUE: Standard myocardial SPECT imaging performed after resting intravenous injection of 10 mCi Tc-73m sestimibi. Subsequently, intravenous infusion of regadenoson performed under the supervision of the Cardiology staff. At peak effect of the drug, 30 mCi Tc-26m sestimibi injected intravenously and standard myocardial SPECT imaging performed. Quantitative gated imaging also performed to evaluate left ventricular wall motion and estimate left ventricular ejection fraction.  FINDINGS: MYOCARDIAL IMAGING WITH SPECT (REST AND PHARMACOLOGIC-STRESS)  There is no evidence for reversibility. There is slightly decreased uptake along the inferior wall on both the stress and rest images. Normal wall motion in the inferior wall and suspect diaphragmatic attenuation.  GATED LEFT VENTRICULAR WALL MOTION STUDY  Review of the gated images demonstrates normal wall motion.  LEFT VENTRICULAR EJECTION FRACTION  QGS ejection fraction measures 64% , with an end-diastolic  volume of 90 ml and an end-systolic volume of 32 ml.  IMPRESSION: No evidence for pharmacological induced ischemia.  Calculated ejection fraction is 64%   Electronically Signed   By: Richarda Overlie M.D.   On: 07/11/2014 12:24    Scheduled Meds: . amLODipine  10 mg Oral Daily  . ampicillin (OMNIPEN) IV  1 g Intravenous Q12H  . aspirin EC  81 mg Oral Daily  . atorvastatin  40 mg Oral q1800  . ipratropium-albuterol  3 mL Nebulization TID  . isosorbide mononitrate  60 mg Oral Daily  . metoprolol tartrate  25  mg Oral BID  . multivitamin  1 tablet Oral QHS  . pantoprazole  40 mg Oral Daily  . predniSONE  30 mg Oral Q breakfast  . senna-docusate  1 tablet Oral BID   Continuous Infusions: . sodium chloride Stopped (07/09/14 1900)    Principal Problem:   NSTEMI (non-ST elevated myocardial infarction) Active Problems:   COPD with acute exacerbation   History of DVT (deep vein thrombosis)   STEMI (ST elevation myocardial infarction)   Acute renal failure   Tobacco abuse   HTN (hypertension)   CHF (congestive heart failure)   GERD (gastroesophageal reflux disease)   Gout   S/P IVC filter   CVA (cerebral vascular accident)   Acute on chronic diastolic CHF (congestive heart failure), NYHA class 3    Time spent: >35 minutes     Esperanza Sheets  [facility] Pager [phone]. If 7PM-7AM, please contact night-coverage at [email or web address], password [facility] 07/12/2014, 9:44 AM  LOS: 7 days

## 2014-07-12 NOTE — Progress Notes (Signed)
S: feels well O:BP 149/81  Pulse 73  Temp(Src) 97.9 F (36.6 C) (Oral)  Resp 16  Ht 5\' 3"  (1.6 m)  Wt 89.495 kg (197 lb 4.8 oz)  BMI 34.96 kg/m2  SpO2 97%  Intake/Output Summary (Last 24 hours) at 07/12/14 1052 Last data filed at 07/12/14 1004  Gross per 24 hour  Intake    250 ml  Output   2050 ml  Net  -1800 ml   Weight change: -2.313 kg (-5 lb 1.6 oz) JOA:CZYSAGen:awake and alert CVS:RRR Resp: decreased BS throughout Abd:+ BS NTND Ext: no edema NEURO:CNI Ox3 No asterixis Rt IJ temp HD    . amLODipine  10 mg Oral Daily  . ampicillin (OMNIPEN) IV  1 g Intravenous Q12H  . aspirin EC  81 mg Oral Daily  . atorvastatin  40 mg Oral q1800  . hydrALAZINE  25 mg Oral 3 times per day  . ipratropium-albuterol  3 mL Nebulization TID  . isosorbide mononitrate  60 mg Oral Daily  . metoprolol tartrate  25 mg Oral BID  . multivitamin  1 tablet Oral QHS  . pantoprazole  40 mg Oral Daily  . [START ON 07/13/2014] predniSONE  20 mg Oral Q breakfast  . senna-docusate  1 tablet Oral BID   Nm Myocar Multi W/spect W/wall Motion / Ef  07/11/2014   CLINICAL DATA:  73 year old with chest pain.  EXAM: MYOCARDIAL IMAGING WITH SPECT (REST AND PHARMACOLOGIC-STRESS)  GATED LEFT VENTRICULAR WALL MOTION STUDY  LEFT VENTRICULAR EJECTION FRACTION  TECHNIQUE: Standard myocardial SPECT imaging performed after resting intravenous injection of 10 mCi Tc-3744m sestimibi. Subsequently, intravenous infusion of regadenoson performed under the supervision of the Cardiology staff. At peak effect of the drug, 30 mCi Tc-5844m sestimibi injected intravenously and standard myocardial SPECT imaging performed. Quantitative gated imaging also performed to evaluate left ventricular wall motion and estimate left ventricular ejection fraction.  FINDINGS: MYOCARDIAL IMAGING WITH SPECT (REST AND PHARMACOLOGIC-STRESS)  There is no evidence for reversibility. There is slightly decreased uptake along the inferior wall on both the stress and rest  images. Normal wall motion in the inferior wall and suspect diaphragmatic attenuation.  GATED LEFT VENTRICULAR WALL MOTION STUDY  Review of the gated images demonstrates normal wall motion.  LEFT VENTRICULAR EJECTION FRACTION  QGS ejection fraction measures 64% , with an end-diastolic volume of 90 ml and an end-systolic volume of 32 ml.  IMPRESSION: No evidence for pharmacological induced ischemia.  Calculated ejection fraction is 64%   Electronically Signed   By: Richarda OverlieAdam  Henn M.D.   On: 07/11/2014 12:24   BMET    Component Value Date/Time   NA 146 07/12/2014 0515   K 3.9 07/12/2014 0515   CL 102 07/12/2014 0515   CO2 27 07/12/2014 0515   GLUCOSE 107* 07/12/2014 0515   BUN 103* 07/12/2014 0515   CREATININE 3.43* 07/12/2014 0515   CALCIUM 7.8* 07/12/2014 0515   GFRNONAA 12* 07/12/2014 0515   GFRAA 14* 07/12/2014 0515   CBC    Component Value Date/Time   WBC 11.5* 07/12/2014 0515   RBC 2.66* 07/12/2014 0515   HGB 7.9* 07/12/2014 0515   HCT 23.7* 07/12/2014 0515   PLT 167 07/12/2014 0515   MCV 89.1 07/12/2014 0515   MCH 29.7 07/12/2014 0515   MCHC 33.3 07/12/2014 0515   RDW 14.5 07/12/2014 0515   LYMPHSABS 1.2 03/22/2013 1347   MONOABS 0.3 03/22/2013 1347   EOSABS 0.7 03/22/2013 1347   BASOSABS 0.0 03/22/2013 1347  Assessment: 1. Acute on CKD 3/4  (baseline Scr around 2),  Renal fx improving 2. NSTEMI  3. Hx HTN 4. COPD 5. Anemia sec CKD and acute blood loss(HD cath bleeding) 6. Sec HPTH   Will let her primary nephrologist tx this since I anticipate her to be going home soon  Plan: 1.  Resume lasix as she was a small dose PTA 2. If Scr improved tomorrow, she could go from renal standpoint and FU with her nephrologist [place] 3. DC foley and increase ambulation 1. Increase amlodipine to 10mg  q d.  BP high and she was on this much PTA.  2.  Hopefully renal fx will cont to improve back to her baseline  Delylah Stanczyk T

## 2014-07-12 NOTE — Progress Notes (Signed)
Pt is c/o of intermittent sharp pains to her vaginal area. Pt had a foley that was d/c'd this am around 0500. Pt has not had any issues voiding she only complains of intermittent pain. NP on call notified. New orders for vicodin prn for pain. Will continue to monitor the pt. Sanda Linger, RN

## 2014-07-12 NOTE — Progress Notes (Signed)
Patient Name: [name] [name] Date of Encounter: 07/12/2014     Principal Problem:   NSTEMI (non-ST elevated myocardial infarction) Active Problems:   COPD with acute exacerbation   History of DVT (deep vein thrombosis)   STEMI (ST elevation myocardial infarction)   Acute renal failure   Tobacco abuse   HTN (hypertension)   CHF (congestive heart failure)   GERD (gastroesophageal reflux disease)   Gout   S/P IVC filter   CVA (cerebral vascular accident)   Acute on chronic diastolic CHF (congestive heart failure), NYHA class 3    SUBJECTIVE  This is improved.  No chest pain overnight.  Lexi scan Myoview shows no reversible ischemia and shows normal left ventricular ejection fraction.  Rhythm normal sinus rhythm.  Diuresed further overnight with 5 pound weight loss overnight.  CURRENT MEDS . amLODipine  10 mg Oral Daily  . ampicillin (OMNIPEN) IV  1 g Intravenous Q12H  . aspirin EC  81 mg Oral Daily  . atorvastatin  40 mg Oral q1800  . ipratropium-albuterol  3 mL Nebulization TID  . isosorbide mononitrate  60 mg Oral Daily  . metoprolol tartrate  25 mg Oral BID  . multivitamin  1 tablet Oral QHS  . pantoprazole  40 mg Oral Daily  . predniSONE  30 mg Oral Q breakfast  . senna-docusate  1 tablet Oral BID    OBJECTIVE  Filed Vitals:   07/11/14 1000 07/11/14 1420 07/11/14 2036 07/12/14 0542  BP: 166/40 128/81 160/60 149/81  Pulse: 82 71 77 73  Temp:  98.4 F (36.9 C) 97.9 F (36.6 C) 97.9 F (36.6 C)  TempSrc:  Oral    Resp:  18 15 16   Height:      Weight:    197 lb 4.8 oz (89.495 kg)  SpO2:  100% 94% 97%    Intake/Output Summary (Last 24 hours) at 07/12/14 0739 Last data filed at 07/12/14 0500  Gross per 24 hour  Intake      0 ml  Output   1650 ml  Net  -1650 ml   Filed Weights   07/10/14 0506 07/11/14 0432 07/12/14 0542  Weight: 201 lb 3.2 oz (91.264 kg) 202 lb 6.4 oz (91.808 kg) 197 lb 4.8 oz (89.495 kg)    PHYSICAL EXAM  General: Pleasant,  NAD. Neuro: Alert and oriented X 3. Moves all extremities spontaneously. Psych: Normal affect. HEENT:  Normal  Neck: Supple without bruits or JVD. Lungs:  Resp regular and unlabored, CTA. Heart: RRR no s3, s4, there is a soft systolic murmur at left sternal edge.  No rub. Abdomen: Soft, non-tender, non-distended, BS + x 4.  Extremities: No clubbing, cyanosis or edema. DP/PT/Radials 2+ and equal bilaterally.  Accessory Clinical Findings  CBC  Recent Labs  07/11/14 0532 07/12/14 0515  WBC 14.1* 11.5*  HGB 8.6* 7.9*  HCT 25.5* 23.7*  MCV 87.9 89.1  PLT 185 167   Basic Metabolic Panel  Recent Labs  07/10/14 0310 07/11/14 0532 07/12/14 0515  NA 143 145 146  K 4.0 3.7 3.9  CL 96 99 102  CO2 26 28 27   GLUCOSE 103* 97 107*  BUN 106* 113* 103*  CREATININE 4.71* 4.21* 3.43*  CALCIUM 8.9 8.5 7.8*  PHOS 7.6*  --   --    Liver Function Tests  Recent Labs  07/10/14 0310  ALBUMIN 3.5   No results found for this basename: LIPASE, AMYLASE,  in the last 72 hours Cardiac Enzymes No results found  for this basename: CKTOTAL, CKMB, CKMBINDEX, TROPONINI,  in the last 72 hours BNP No components found with this basename: POCBNP,  D-Dimer No results found for this basename: DDIMER,  in the last 72 hours Hemoglobin A1C No results found for this basename: HGBA1C,  in the last 72 hours Fasting Lipid Panel No results found for this basename: CHOL, HDL, LDLCALC, TRIG, CHOLHDL, LDLDIRECT,  in the last 72 hours Thyroid Function Tests No results found for this basename: TSH, T4TOTAL, FREET3, T3FREE, THYROIDAB,  in the last 72 hours  TELE  Normal sinus rhythm  ECG    Radiology/Studies  Nm Myocar Multi W/spect W/wall Motion / Ef  07/11/2014   CLINICAL DATA:  73 year old with chest pain.  EXAM: MYOCARDIAL IMAGING WITH SPECT (REST AND PHARMACOLOGIC-STRESS)  GATED LEFT VENTRICULAR WALL MOTION STUDY  LEFT VENTRICULAR EJECTION FRACTION  TECHNIQUE: Standard myocardial SPECT imaging  performed after resting intravenous injection of 10 mCi Tc-7923m sestimibi. Subsequently, intravenous infusion of regadenoson performed under the supervision of the Cardiology staff. At peak effect of the drug, 30 mCi Tc-5823m sestimibi injected intravenously and standard myocardial SPECT imaging performed. Quantitative gated imaging also performed to evaluate left ventricular wall motion and estimate left ventricular ejection fraction.  FINDINGS: MYOCARDIAL IMAGING WITH SPECT (REST AND PHARMACOLOGIC-STRESS)  There is no evidence for reversibility. There is slightly decreased uptake along the inferior wall on both the stress and rest images. Normal wall motion in the inferior wall and suspect diaphragmatic attenuation.  GATED LEFT VENTRICULAR WALL MOTION STUDY  Review of the gated images demonstrates normal wall motion.  LEFT VENTRICULAR EJECTION FRACTION  QGS ejection fraction measures 64% , with an end-diastolic volume of 90 ml and an end-systolic volume of 32 ml.  IMPRESSION: No evidence for pharmacological induced ischemia.  Calculated ejection fraction is 64%   Electronically Signed   By: Richarda OverlieAdam  Henn M.D.   On: 07/11/2014 12:24   Dg Chest Port 1 View  07/05/2014   CLINICAL DATA:  Dialysis catheter placed  EXAM: PORTABLE CHEST - 1 VIEW  COMPARISON:  07/02/2014  FINDINGS: There is a right IJ dialysis catheter, tip in the upper right atrium or cavoatrial junction. No pneumothorax, new mediastinal widening, or pleural effusion.  Mild cardiomegaly which is stable from prior. Mild pulmonary hyperinflation. No edema. Patchy sclerosis in the right humeral head which may reflect AVN.  IMPRESSION: New right IJ dialysis catheter without adverse feature.   Electronically Signed   By: Tiburcio PeaJonathan  Watts M.D.   On: 07/05/2014 23:29    ASSESSMENT AND PLAN 1. NSTEMI  - Stress test yesterday shows no reversibility and shows ejection fraction normal at 64% - Echo 07/07/2014 EF 60-65%, moderate LVH, grade 1 diastolic dysfunction    2. Acute on chronic renal insufficiency  - Creatinine continues to improve.  She is not currently on any diuretic 3. COPD-  on abx  4. tobacco abuse: quit last yr, smoked for close to 60 yrs  5. chronic diastolic heart failure  6. 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 Hypertension  8. history of stroke  9. Anemia - monitor  Plan: Continue conservative therapy for her non-STEMI with aspirin, beta blocker, statin, and nitrates.  Signed, Cassell Clementhomas Dagny Fiorentino MD

## 2014-07-13 LAB — BASIC METABOLIC PANEL
Anion gap: 16 — ABNORMAL HIGH (ref 5–15)
BUN: 99 mg/dL — AB (ref 6–23)
CHLORIDE: 104 meq/L (ref 96–112)
CO2: 27 mEq/L (ref 19–32)
Calcium: 8.2 mg/dL — ABNORMAL LOW (ref 8.4–10.5)
Creatinine, Ser: 3.32 mg/dL — ABNORMAL HIGH (ref 0.50–1.10)
GFR calc Af Amer: 15 mL/min — ABNORMAL LOW (ref >90)
GFR, EST NON AFRICAN AMERICAN: 13 mL/min — AB (ref >90)
Glucose, Bld: 86 mg/dL (ref 70–99)
Potassium: 3.3 mEq/L — ABNORMAL LOW (ref 3.7–5.3)
Sodium: 147 mEq/L (ref 137–147)

## 2014-07-13 LAB — CBC
HEMATOCRIT: 25.1 % — AB (ref 36.0–46.0)
Hemoglobin: 8.3 g/dL — ABNORMAL LOW (ref 12.0–15.0)
MCH: 29.9 pg (ref 26.0–34.0)
MCHC: 33.1 g/dL (ref 30.0–36.0)
MCV: 90.3 fL (ref 78.0–100.0)
Platelets: 166 10*3/uL (ref 150–400)
RBC: 2.78 MIL/uL — ABNORMAL LOW (ref 3.87–5.11)
RDW: 14.6 % (ref 11.5–15.5)
WBC: 14 10*3/uL — ABNORMAL HIGH (ref 4.0–10.5)

## 2014-07-13 MED ORDER — HYDRALAZINE HCL 50 MG PO TABS
50.0000 mg | ORAL_TABLET | Freq: Three times a day (TID) | ORAL | Status: DC
  Filled 2014-07-13 (×3): qty 1

## 2014-07-13 MED ORDER — AMLODIPINE BESYLATE 10 MG PO TABS
10.0000 mg | ORAL_TABLET | Freq: Every day | ORAL | Status: DC
  Filled 2014-07-13: qty 1

## 2014-07-13 MED ORDER — CLONIDINE HCL 0.1 MG PO TABS: 0.1000 mg | ORAL_TABLET | ORAL | Status: DC | PRN

## 2014-07-13 MED ORDER — ATORVASTATIN CALCIUM 40 MG PO TABS
40.0000 mg | ORAL_TABLET | Freq: Every day | ORAL | Status: DC
Start: 2014-07-13 — End: 2015-03-08

## 2014-07-13 MED ORDER — ISOSORBIDE MONONITRATE ER 60 MG PO TB24
60.0000 mg | ORAL_TABLET | Freq: Every day | ORAL | Status: DC
Start: 2014-07-13 — End: 2014-09-09

## 2014-07-13 MED ORDER — DARBEPOETIN ALFA-POLYSORBATE 100 MCG/0.5ML IJ SOLN
100.0000 ug | Freq: Once | INTRAMUSCULAR | Status: AC
  Administered 2014-07-13: 100 ug via SUBCUTANEOUS
  Filled 2014-07-13: qty 0.5

## 2014-07-13 MED ORDER — POTASSIUM CHLORIDE ER 10 MEQ PO TBCR: 10.0000 meq | EXTENDED_RELEASE_TABLET | Freq: Every day | ORAL | Status: DC

## 2014-07-13 MED ORDER — AMLODIPINE BESYLATE 10 MG PO TABS: 10.0000 mg | ORAL_TABLET | Freq: Every day | ORAL | Status: DC

## 2014-07-13 MED ORDER — FUROSEMIDE 80 MG PO TABS
80.0000 mg | ORAL_TABLET | Freq: Every day | ORAL | Status: DC
  Administered 2014-07-13: 80 mg via ORAL
  Filled 2014-07-13: qty 1

## 2014-07-13 MED ORDER — IPRATROPIUM BROMIDE 0.02 % IN SOLN: 500.0000 ug | Freq: Four times a day (QID) | RESPIRATORY_TRACT | Status: DC | PRN

## 2014-07-13 MED ORDER — HYDROCODONE-ACETAMINOPHEN 5-325 MG PO TABS: 1.0000 | ORAL_TABLET | Freq: Four times a day (QID) | ORAL | Status: DC | PRN

## 2014-07-13 MED ORDER — PREDNISONE 10 MG PO TABS: 10.0000 mg | ORAL_TABLET | Freq: Every day | ORAL | Status: DC

## 2014-07-13 MED ORDER — FUROSEMIDE 80 MG PO TABS: 80.0000 mg | ORAL_TABLET | Freq: Every day | ORAL | Status: DC

## 2014-07-13 MED ORDER — METOPROLOL TARTRATE 50 MG PO TABS
50.0000 mg | ORAL_TABLET | Freq: Two times a day (BID) | ORAL | Status: DC
  Administered 2014-07-13: 50 mg via ORAL
  Filled 2014-07-13 (×2): qty 1

## 2014-07-13 MED ORDER — FLUTICASONE-SALMETEROL 250-50 MCG/DOSE IN AEPB: 1.0000 | INHALATION_SPRAY | Freq: Two times a day (BID) | RESPIRATORY_TRACT | Status: DC

## 2014-07-13 MED ORDER — ASPIRIN 81 MG PO TBEC: 81.0000 mg | DELAYED_RELEASE_TABLET | Freq: Every day | ORAL | Status: AC

## 2014-07-13 MED ORDER — ALBUTEROL SULFATE (2.5 MG/3ML) 0.083% IN NEBU: 2.5000 mg | INHALATION_SOLUTION | RESPIRATORY_TRACT | Status: DC | PRN

## 2014-07-13 MED ORDER — METOPROLOL TARTRATE 50 MG PO TABS: 50.0000 mg | ORAL_TABLET | Freq: Two times a day (BID) | ORAL | Status: DC

## 2014-07-13 NOTE — Progress Notes (Signed)
Patient Name: [name] [name] Date of Encounter: 07/13/2014     Principal Problem:   NSTEMI (non-ST elevated myocardial infarction) Active Problems:   COPD with acute exacerbation   History of DVT (deep vein thrombosis)   STEMI (ST elevation myocardial infarction)   Acute renal failure   Tobacco abuse   HTN (hypertension)   CHF (congestive heart failure)   GERD (gastroesophageal reflux disease)   Gout   S/P IVC filter   CVA (cerebral vascular accident)   Acute on chronic diastolic CHF (congestive heart failure), NYHA class 3    SUBJECTIVE  Denies any chest pain or SOB. Had significant dyspnea overnight requiring pain med and complain of smelly urine. Live in [place], however willing to establish cardiology follow up here in [place]. Also considering switching nephrologist soon.  CURRENT MEDS . amLODipine  10 mg Oral Daily  . aspirin EC  81 mg Oral Daily  . atorvastatin  40 mg Oral q1800  . furosemide  40 mg Oral Daily  . hydrALAZINE  25 mg Oral 3 times per day  . ipratropium-albuterol  3 mL Nebulization TID  . isosorbide mononitrate  60 mg Oral Daily  . metoprolol tartrate  25 mg Oral BID  . multivitamin  1 tablet Oral QHS  . pantoprazole  40 mg Oral Daily  . predniSONE  20 mg Oral Q breakfast  . senna-docusate  1 tablet Oral BID    OBJECTIVE  Filed Vitals:   07/12/14 2112 07/12/14 2245 07/12/14 2333 07/13/14 0630  BP: 181/67 186/58 163/54 170/66  Pulse: 82 86  84  Temp: 98.6 F (37 C)   98.4 F (36.9 C)  TempSrc: Oral     Resp: 16   18  Height:      Weight:      SpO2: 94%   97%    Intake/Output Summary (Last 24 hours) at 07/13/14 0734 Last data filed at 07/13/14 0421  Gross per 24 hour  Intake    810 ml  Output   2100 ml  Net  -1290 ml   Filed Weights   07/10/14 0506 07/11/14 0432 07/12/14 0542  Weight: 201 lb 3.2 oz (91.264 kg) 202 lb 6.4 oz (91.808 kg) 197 lb 4.8 oz (89.495 kg)    PHYSICAL EXAM  General: Pleasant, NAD. Neuro: Alert and  oriented X 3. Moves all extremities spontaneously. Psych: Normal affect. HEENT:  Normal  Neck: Supple without bruits or JVD. Lungs:  Resp regular and unlabored, moving air better, however continue to have significant expiratory wheezing. Heart: RRR no s3, s4, or murmurs. Abdomen: Soft, non-tender, non-distended, BS + x 4.  Extremities: No clubbing, cyanosis. DP/PT/Radials 2+ and equal bilaterally. 1+ nonpitting edema in lower extremity  Accessory Clinical Findings  CBC  Recent Labs  07/12/14 0515 07/13/14 0415  WBC 11.5* 14.0*  HGB 7.9* 8.3*  HCT 23.7* 25.1*  MCV 89.1 90.3  PLT 167 166   Basic Metabolic Panel  Recent Labs  07/12/14 0515 07/13/14 0415  NA 146 147  K 3.9 3.3*  CL 102 104  CO2 27 27  GLUCOSE 107* 86  BUN 103* 99*  CREATININE 3.43* 3.32*  CALCIUM 7.8* 8.2*    TELE  NSR with HR 60-80s, no significant ventricular ectopy  ECG  No recent EKG  Radiology/Studies  Nm Myocar Multi W/spect W/wall Motion / Ef  07/11/2014   CLINICAL DATA:  73 year old with chest pain.  EXAM: MYOCARDIAL IMAGING WITH SPECT (REST AND PHARMACOLOGIC-STRESS)  GATED LEFT VENTRICULAR WALL  MOTION STUDY  LEFT VENTRICULAR EJECTION FRACTION  TECHNIQUE: Standard myocardial SPECT imaging performed after resting intravenous injection of 10 mCi Tc-32m sestimibi. Subsequently, intravenous infusion of regadenoson performed under the supervision of the Cardiology staff. At peak effect of the drug, 30 mCi Tc-38m sestimibi injected intravenously and standard myocardial SPECT imaging performed. Quantitative gated imaging also performed to evaluate left ventricular wall motion and estimate left ventricular ejection fraction.  FINDINGS: MYOCARDIAL IMAGING WITH SPECT (REST AND PHARMACOLOGIC-STRESS)  There is no evidence for reversibility. There is slightly decreased uptake along the inferior wall on both the stress and rest images. Normal wall motion in the inferior wall and suspect diaphragmatic  attenuation.  GATED LEFT VENTRICULAR WALL MOTION STUDY  Review of the gated images demonstrates normal wall motion.  LEFT VENTRICULAR EJECTION FRACTION  QGS ejection fraction measures 64% , with an end-diastolic volume of 90 ml and an end-systolic volume of 32 ml.  IMPRESSION: No evidence for pharmacological induced ischemia.  Calculated ejection fraction is 64%   Electronically Signed   By: Richarda Overlie M.D.   On: 07/11/2014 12:24   Dg Chest Port 1 View  07/05/2014   CLINICAL DATA:  Dialysis catheter placed  EXAM: PORTABLE CHEST - 1 VIEW  COMPARISON:  07/02/2014  FINDINGS: There is a right IJ dialysis catheter, tip in the upper right atrium or cavoatrial junction. No pneumothorax, new mediastinal widening, or pleural effusion.  Mild cardiomegaly which is stable from prior. Mild pulmonary hyperinflation. No edema. Patchy sclerosis in the right humeral head which may reflect AVN.  IMPRESSION: New right IJ dialysis catheter without adverse feature.   Electronically Signed   By: Tiburcio Pea M.D.   On: 07/05/2014 23:29    ASSESSMENT AND PLAN  1. NSTEMI  - unable to cath initially due to worsening Cr which peaked >4 - lexiscan stress test on 8/8 no reversibility and shows ejection fraction normal at 64%  - Echo 07/07/2014 EF 60-65%, moderate LVH, grade 1 diastolic dysfunction  - no immediate plan for cath given Cr 3.32 and negative myoview, continue conservative therapy.  - continue ASA, lipitor, Imdur, metoprolol - SBP 170s even after amlodipine increased, will increase metoprolol from 25mg  BID to 50mg  BID - stable from cardiology perspective, will arrange follow up  2. Acute on chronic renal insufficiency  - Creatinine continues to improve. On 40 PO lasix, monitor renal function as outpt  - per nephro, possibly d/c today if Cr improve and follow up with nephrologist in [facility]  3. COPD- s/p abx. WBC elevated while on prednisone  4. Tobacco abuse: quit last yr, smoked for close to 60 yrs  5.  Chronic diastolic heart failure   - advised pt on low Na diet and daily weight, call cardiology if weight increased by more than 3 lbs overnight or more than 5 lbs in a single week  6. 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 Hypertension  8. history of stroke  9. Anemia - monitor  10. Dysuria - prolong folley placement, s/p rocephin and azithromycin for COPD exacerbation. Also has been on steroid  - complain of smelly urine since yesterday, significant dysuria since folley removal  - will obtain urinalysis, may need abx for possible UTI prior to discharge  Signed, Amedeo Plenty Pager: [phone]  Attending Note:   The patient was seen and examined.  Agree with assessment and plan as noted above.  Changes made to the above note as needed.  She is stable  from a cardiac standpoint.  No angina.  myoview shows no ischemia.  EF remains normal.   She has chronic wheezing.  Long hx of smoking.   She should be ready for DC later today.   Vesta MixerPhilip J. Zyren Sevigny, Montez HagemanJr., MD, [facility] 07/13/2014, 8:55 AM

## 2014-07-13 NOTE — Progress Notes (Signed)
SATURATION QUALIFICATIONS: (This note is used to comply with regulatory documentation for home oxygen)  Patient Saturations on Room Air at Rest = 100%  Patient Saturations on Room Air while Ambulating = 95%  

## 2014-07-13 NOTE — Progress Notes (Signed)
Utilization review completed.  

## 2014-07-13 NOTE — Progress Notes (Addendum)
[facility] PROGRESS NOTE  [name] [name] ZOX:[id number] DOB: 06/18/1941 DOA: 07/05/2014 PCP: No primary provider on file.  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 Despite stopping Lasix and ACE I her renal function progressively worsened to a peak of ~7 (baseline of 1.2-2.5).  -[facility] and emergent HD was initiated. Cardiac enzymes were also checked and were elevated in the absence of EKG changes. Cardiology was consulted. ECHO was WNL except for grade 1 diastolic dysfunction. IV Heparin was started. She stabilized and was able to transfer out to SDU.   1. Acute renal failure on CKDIII s/p HD x1 per nephrology; off HD: labs improving; on lasix;  -need to remove HD cath  2. COPD with acute exacerbation; CXR : no clear infiltrates; improved; no wheezing on exam   -cont nebs, bronchodilators; oxygen, doxycycline prednisone-taper  -desat study  3. Hx DVT/PE Hx IVC filter placement; [facility]  -Heparin drip for presumed cardiac ischemia but had to dc 8/5 due to excessive bleeding around HD catheter  -already on SCDs; repeat LE US: no DVT  4. NSTEMI; chest pain resolved  -dc Heparin drip due to bleeding; cont Imdur,ASA and statin;added BB -8/8: stress test: No evidence for pharmacological induced ischemia.  Calculated ejection fraction is 64% 5. Acute on chronic diastolic CHF  -Echocardiogram WNL except for grade 1 diastolic dysfunction; cont diuresis per nephrology  6. HTN increased Amlodipine, BB; cont Clonidine and Imdur, added hydralazine   7. UTI Enterococcus resistant to Levaquin; afebrile;  -Sensitive to Ampicillin; completed ampicillin   8. Tobacco abuse Discussed abstinence   9. Hx CVA Neurologically intact  10. Anemia, AoCD, with episode of blood loss; TFsed 8/5; Hg stable; off heparin; cont monitor TF prn     D/c plans per nephrology   Consultants:  Dr. York RamJonathan Barry (cardiology)  Dr. Primitivo GauzeMichael Mattingly (nephrology)  Dr. Evalyn CascoAdam Belanger (PCCM)   Procedure/Significant Events:  Echocardiogram  - Left ventricle: The cavity size was normal. There was moderate concentric hypertrophy. Systolic function was normal. The estimated ejection fraction was in the range of 60% to 65%. Wall motion was normal; there were no regional wall motion abnormalities. Doppler parameters are consistent with abnormal left ventricular relaxation (grade 1 diastolic dysfunction). There was no evidence of elevated ventricular filling pressure by Doppler parameters. - Aortic valve: Trileaflet; normal thickness leaflets. Transvalvular velocity was within the normal range. There was no stenosis. There was no regurgitation. - Aortic root: The aortic root was normal in size. - Mitral valve: Structurally normal valve. There was no regurgitation. - Right ventricle: Systolic function was normal. - Right atrium: The atrium was normal in size. - Tricuspid valve: There was no regurgitation. - Pulmonary arteries: Systolic pressure was within the normal range.  Culture  8/2 blood right forearm/left arm NGTD  8/2 MRSA by PCR negative  8/3 urine positive enterococcus  Antibiotics:  Doxycycline 8/3>> stopped 8/4  Ampicillin 8./4>>8/9   Code Status: full Family Communication: d/w patient (indicate person spoken with, relationship, and if by phone, the number) Disposition Plan: pend clinical improvement   HPI/Subjective: alert  Objective: Filed Vitals:   07/13/14 0630  BP: 170/66  Pulse: 84  Temp: 98.4 F (36.9 C)  Resp: 18    Intake/Output Summary (Last  24 hours) at 07/13/14 0854 Last data filed at 07/13/14 0421  Gross per 24 hour  Intake    570 ml  Output   2100 ml   Net  -1530 ml   Filed Weights   07/10/14 0506 07/11/14 0432 07/12/14 0542  Weight: 91.264 kg (201 lb 3.2 oz) 91.808 kg (202 lb 6.4 oz) 89.495 kg (197 lb 4.8 oz)    Exam:   General:  Alert, oriented   Cardiovascular: s1,s2 rrr  Respiratory: few crackles   Abdomen: soft, nt,nd   Musculoskeletal: mild edema   Data Reviewed: Basic Metabolic Panel:  Recent Labs Lab 07/07/14 0745 07/08/14 0500 07/09/14 0410 07/10/14 0310 07/11/14 0532 07/12/14 0515 07/13/14 0415  NA 144 142 140 143 145 146 147  K 3.6* 3.7 3.7 4.0 3.7 3.9 3.3*  CL 102 100 98 96 99 102 104  CO2 25 26 23 26 28 27 27   GLUCOSE 109* 109* 104* 103* 97 107* 86  BUN 76* 63* 88* 106* 113* 103* 99*  CREATININE 4.24* 3.66* 4.35* 4.71* 4.21* 3.43* 3.32*  CALCIUM 7.7* 8.0* 8.2* 8.9 8.5 7.8* 8.2*  PHOS 7.9* 7.4* 7.5* 7.6*  --   --   --    Liver Function Tests:  Recent Labs Lab 07/07/14 0745 07/08/14 0500 07/09/14 0410 07/10/14 0310  ALBUMIN 3.0* 2.9* 2.9* 3.5   No results found for this basename: LIPASE, AMYLASE,  in the last 168 hours No results found for this basename: AMMONIA,  in the last 168 hours CBC:  Recent Labs Lab 07/09/14 0410 07/10/14 0310 07/11/14 0532 07/12/14 0515 07/13/14 0415  WBC 11.9* 14.1* 14.1* 11.5* 14.0*  HGB 8.1* 9.4* 8.6* 7.9* 8.3*  HCT 23.9* 27.4* 25.5* 23.7* 25.1*  MCV 87.5 87.0 87.9 89.1 90.3  PLT 164 209 185 167 166   Cardiac Enzymes: No results found for this basename: CKTOTAL, CKMB, CKMBINDEX, TROPONINI,  in the last 168 hours BNP (last 3 results) No results found for this basename: PROBNP,  in the last 8760 hours CBG: No results found for this basename: GLUCAP,  in the last 168 hours  Recent Results (from the past 240 hour(s))  MRSA PCR SCREENING     Status: None   Collection Time    07/05/14  3:26 PM      Result Value Ref Range Status   MRSA by PCR NEGATIVE  NEGATIVE Final   Comment:            The GeneXpert MRSA Assay (FDA     approved for NASAL  specimens     only), is one component of a     comprehensive MRSA colonization     surveillance program. It is not     intended to diagnose MRSA     infection nor to guide or     monitor treatment for     MRSA infections.  CULTURE, BLOOD (ROUTINE X 2)     Status: None   Collection Time    07/05/14  5:20 PM      Result Value Ref Range Status   Specimen Description BLOOD RIGHT FOREARM   Final   Special Requests BOTTLES DRAWN AEROBIC ONLY 5CC   Final   Culture  Setup Time     Final   Value: 07/06/2014 00:54     Performed at [facility]   Culture     Final   Value: NO GROWTH 5 DAYS     Performed at [facility]   Report  Status 07/12/2014 FINAL   Final  CULTURE, BLOOD (ROUTINE X 2)     Status: None   Collection Time    07/05/14  5:25 PM      Result Value Ref Range Status   Specimen Description BLOOD LEFT ARM   Final   Special Requests BOTTLES DRAWN AEROBIC ONLY [facility]   Final   Culture  Setup Time     Final   Value: 07/06/2014 00:54     Performed at [facility]   Culture     Final   Value: NO GROWTH 5 DAYS     Performed at [facility]   Report Status 07/12/2014 FINAL   Final  URINE CULTURE     Status: None   Collection Time    07/06/14  1:12 AM      Result Value Ref Range Status   Specimen Description URINE, RANDOM   Final   Special Requests NONE   Final   Culture  Setup Time     Final   Value: 07/06/2014 01:59     Performed at [facility] Count     Final   Value: >=100,000 COLONIES/ML     Performed at [facility]   Culture     Final   Value: ENTEROCOCCUS SPECIES     Performed at [facility]   Report Status 07/08/2014 FINAL   Final   Organism ID, Bacteria ENTEROCOCCUS SPECIES   Final     Studies: Nm Myocar Multi W/spect W/wall Motion / Ef  07/11/2014   CLINICAL DATA:  73 year old with chest pain.  EXAM: MYOCARDIAL IMAGING WITH SPECT (REST AND PHARMACOLOGIC-STRESS)  GATED LEFT VENTRICULAR WALL MOTION  STUDY  LEFT VENTRICULAR EJECTION FRACTION  TECHNIQUE: Standard myocardial SPECT imaging performed after resting intravenous injection of 10 mCi Tc-39m sestimibi. Subsequently, intravenous infusion of regadenoson performed under the supervision of the Cardiology staff. At peak effect of the drug, 30 mCi Tc-45m sestimibi injected intravenously and standard myocardial SPECT imaging performed. Quantitative gated imaging also performed to evaluate left ventricular wall motion and estimate left ventricular ejection fraction.  FINDINGS: MYOCARDIAL IMAGING WITH SPECT (REST AND PHARMACOLOGIC-STRESS)  There is no evidence for reversibility. There is slightly decreased uptake along the inferior wall on both the stress and rest images. Normal wall motion in the inferior wall and suspect diaphragmatic attenuation.  GATED LEFT VENTRICULAR WALL MOTION STUDY  Review of the gated images demonstrates normal wall motion.  LEFT VENTRICULAR EJECTION FRACTION  QGS ejection fraction measures 64% , with an end-diastolic volume of 90 ml and an end-systolic volume of 32 ml.  IMPRESSION: No evidence for pharmacological induced ischemia.  Calculated ejection fraction is 64%   Electronically Signed   By: Richarda Overlie M.D.   On: 07/11/2014 12:24    Scheduled Meds: . amLODipine  10 mg Oral Daily  . aspirin EC  81 mg Oral Daily  . atorvastatin  40 mg Oral q1800  . furosemide  40 mg Oral Daily  . hydrALAZINE  25 mg Oral 3 times per day  . ipratropium-albuterol  3 mL Nebulization TID  . isosorbide mononitrate  60 mg Oral Daily  . metoprolol tartrate  50 mg Oral BID  . multivitamin  1 tablet Oral QHS  . pantoprazole  40 mg Oral Daily  . predniSONE  20 mg Oral Q breakfast  . senna-docusate  1 tablet Oral BID   Continuous Infusions: . sodium chloride Stopped (07/09/14 1900)  Principal Problem:   NSTEMI (non-ST elevated myocardial infarction) Active Problems:   COPD with acute exacerbation   History of DVT (deep vein  thrombosis)   STEMI (ST elevation myocardial infarction)   Acute renal failure   Tobacco abuse   HTN (hypertension)   CHF (congestive heart failure)   GERD (gastroesophageal reflux disease)   Gout   S/P IVC filter   CVA (cerebral vascular accident)   Acute on chronic diastolic CHF (congestive heart failure), NYHA class 3    Time spent: >35 minutes     Esperanza Sheets  [facility] Pager [phone]. If 7PM-7AM, please contact night-coverage at [email or web address], password [facility] 07/13/2014, 8:54 AM  LOS: 8 days

## 2014-07-13 NOTE — Discharge Summary (Signed)
Physician Discharge Summary  [name] [name] WUJ:[id number] DOB: Feb 25, 1941 DOA: 07/05/2014  PCP: No primary provider on file.  Admit date: 07/05/2014 Discharge date: 07/13/2014  Time spent: >35 minutes  Recommendations for Outpatient Follow-up:  F/u with nephrology in 2 weeks F/u with PCP in 1 week  Discharge Diagnoses:  Principal Problem:   NSTEMI (non-ST elevated myocardial infarction) Active Problems:   COPD with acute exacerbation   History of DVT (deep vein thrombosis)   STEMI (ST elevation myocardial infarction)   Acute renal failure   Tobacco abuse   HTN (hypertension)   CHF (congestive heart failure)   GERD (gastroesophageal reflux disease)   Gout   S/P IVC filter   CVA (cerebral vascular accident)   Acute on chronic diastolic CHF (congestive heart failure), NYHA class 3   Discharge Condition: stable   Diet recommendation: low sodium   Filed Weights   07/10/14 0506 07/11/14 0432 07/12/14 0542  Weight: 91.264 kg (201 lb 3.2 oz) 91.808 kg (202 lb 6.4 oz) 89.495 kg (197 lb 4.8 oz)    History of present illness:  73yo F w/ Hx COPD, tobacco abuse, chronic diastolic heart failure, 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 Despite stopping Lasix and ACE I her renal function progressively worsened to a peak of ~7 (baseline of 1.2-2.5).  -After arrival to [facility] Nephrology was consulted and emergent HD was initiated. Cardiac enzymes were also checked and were elevated in the absence of EKG changes. Cardiology was consulted. ECHO was WNL except for grade 1 diastolic dysfunction. IV Heparin was started. She stabilized and was able to transfer out to SDU.   Hospital Course:  1. Acute renal failure on CKDIII s/p HD x1 per nephrology; off HD: labs improving; on lasix;  -s/p removal HD cath; d/w nephrology; cont PO lasix, with PO kcl;  outpatient follow up  2. COPD with acute exacerbation; CXR : no clear infiltrates; improved; no wheezing on exam  -cont nebs, bronchodilators; oxygen, doxycycline prednisone-taper 3. Hx DVT/PE Hx IVC filter placement; per patient competed [facility] treatment last year  -repeat LE [place]: no DVT; Pt has already stopped taking AC 1 year ago; cont OP follow up  4. NSTEMI; chest pain resolved  -dc Heparin drip due to bleeding; cont Imdur,ASA and statin;added BB  -8/8: stress test: No evidence for pharmacological induced ischemia. Calculated ejection fraction is 64%  5. Acute on chronic diastolic CHF  -Echocardiogram WNL except for grade 1 diastolic dysfunction; cont diuresis; stable  6. HTN increased Amlodipine, BB; cont Clonidine and Imdur, added hydralazine; f/u with PCP in 1 week to adjust meds as needed  7. UTI Enterococcus resistant to Levaquin; afebrile;  -Sensitive to Ampicillin; completed ampicillin; dysuria improved after removal of foley cath (8/9) 8. Tobacco abuse Discussed abstinence  9. Hx CVA Neurologically intact  10. Anemia, AoCD, with episode of blood loss; TFsed 8/5; Hg stable;    D/w patient, cont OP follow up    Consultants:  Dr. York Ram (cardiology)  Dr. Primitivo Gauze (nephrology)  Dr. Evalyn Casco (PCCM)  Procedure/Significant Events:  Echocardiogram  - Left ventricle: The cavity size was normal. There was moderate concentric hypertrophy. Systolic function was normal. The estimated ejection fraction was in the range of 60% to 65%. Wall motion was normal; there were no regional wall motion abnormalities. Doppler parameters are consistent with abnormal left ventricular relaxation (grade 1  diastolic dysfunction). There was no evidence of elevated ventricular filling pressure by Doppler parameters. - Aortic valve: Trileaflet; normal thickness leaflets. Transvalvular velocity was within the normal range. There was no stenosis. There was no regurgitation. - Aortic  root: The aortic root was normal in size. - Mitral valve: Structurally normal valve. There was no regurgitation. - Right ventricle: Systolic function was normal. - Right atrium: The atrium was normal in size. - Tricuspid valve: There was no regurgitation. - Pulmonary arteries: Systolic pressure was within the normal range.  Culture  8/2 blood right forearm/left arm NGTD  8/2 MRSA by PCR negative  8/3 urine positive enterococcus  Antibiotics:  Doxycycline 8/3>> stopped 8/4  Ampicillin 8./4>>8/9     Discharge Exam: Filed Vitals:   07/13/14 0630  BP: 170/66  Pulse: 84  Temp: 98.4 F (36.9 C)  Resp: 18    General: alert Cardiovascular: s1,s2 rrr Respiratory: CTA BL  Discharge Instructions  Discharge Instructions   Diet - low sodium heart healthy    Complete by:  As directed      Discharge instructions    Complete by:  As directed   Please follow up with nephrologist in 1-2 week  Please follow up with primary care doctor in 1 week     Increase activity slowly    Complete by:  As directed             Medication List    STOP taking these medications       beclomethasone 80 MCG/ACT inhaler  Commonly known as:  QVAR     BYSTOLIC 20 MG Tabs  Generic drug:  Nebivolol HCl     DULERA 200-5 MCG/ACT Aero  Generic drug:  mometasone-formoterol     HYDROcodone-homatropine 5-1.5 MG/5ML syrup  Commonly known as:  HYCODAN     levofloxacin 750 MG tablet  Commonly known as:  LEVAQUIN     lisinopril 20 MG tablet  Commonly known as:  PRINIVIL,ZESTRIL     traMADol 50 MG tablet  Commonly known as:  ULTRAM      TAKE these medications       albuterol (2.5 MG/3ML) 0.083% nebulizer solution  Commonly known as:  PROVENTIL  Take 3 mLs (2.5 mg total) by nebulization every 4 (four) hours as needed for wheezing or shortness of breath.     amLODipine 10 MG tablet  Commonly known as:  NORVASC  Take 1 tablet (10 mg total) by mouth daily.     aspirin 81 MG EC tablet  Take  1 tablet (81 mg total) by mouth daily.     atorvastatin 40 MG tablet  Commonly known as:  LIPITOR  Take 1 tablet (40 mg total) by mouth at bedtime.     CALCIUM 500 PO  Take 500 mg by mouth daily.     cloNIDine 0.1 MG tablet  Commonly known as:  CATAPRES  Take 1 tablet (0.1 mg total) by mouth every 4 (four) hours as needed (SBP > 170 or DBP >110).     Fluticasone-Salmeterol 250-50 MCG/DOSE Aepb  Commonly known as:  ADVAIR  Inhale 1 puff into the lungs every 12 (twelve) hours.     furosemide 80 MG tablet  Commonly known as:  LASIX  Take 1 tablet (80 mg total) by mouth daily.     guaiFENesin 600 MG 12 hr tablet  Commonly known as:  MUCINEX  Take 1,200 mg by mouth every 12 (twelve) hours as needed for congestion.     HYDROcodone-acetaminophen  5-325 MG per tablet  Commonly known as:  NORCO/VICODIN  Take 1 tablet by mouth every 6 (six) hours as needed for moderate pain.     ipratropium 0.02 % nebulizer solution  Commonly known as:  ATROVENT  Take 2.5 mLs (500 mcg total) by nebulization every 6 (six) hours as needed for wheezing ((with Xopenex)).     isosorbide mononitrate 60 MG 24 hr tablet  Commonly known as:  IMDUR  Take 1 tablet (60 mg total) by mouth daily.     metoprolol 50 MG tablet  Commonly known as:  LOPRESSOR  Take 1 tablet (50 mg total) by mouth 2 (two) times daily.     nitroGLYCERIN 0.4 MG SL tablet  Commonly known as:  NITROSTAT  Place 0.4 mg under the tongue every 5 (five) minutes as needed for chest pain.     omeprazole 20 MG capsule  Commonly known as:  PRILOSEC  Take 20 mg by mouth daily.     potassium chloride 10 MEQ tablet  Commonly known as:  K-DUR  Take 1 tablet (10 mEq total) by mouth daily.     predniSONE 10 MG tablet  Commonly known as:  DELTASONE  Take 1 tablet (10 mg total) by mouth daily with breakfast.     tretinoin 0.1 % cream  Commonly known as:  RETIN-A  Apply 1 application topically as directed.       Allergies  Allergen  Reactions  . Ciprofloxacin     rash   Follow-up Information   Follow up with Lars Masson, MD On 08/04/2014. (11:45am)    Specialty:  Cardiology   Contact information:   [address] [place] [address] [phone]       Follow up with [facility]     In 1 week.   Contact information:   [address] [place] [address] [phone]      Follow up with MATTINGLY,MICHAEL T, MD. Schedule an appointment as soon as possible for a visit in 2 weeks.   Specialty:  Nephrology   Contact information:   [address] [place] [address] [phone]        The results of significant diagnostics from this hospitalization (including imaging, microbiology, ancillary and laboratory) are listed below for reference.    Significant Diagnostic Studies: Nm Myocar Multi W/spect W/wall Motion / Ef  07/11/2014   CLINICAL DATA:  73 year old with chest pain.  EXAM: MYOCARDIAL IMAGING WITH SPECT (REST AND PHARMACOLOGIC-STRESS)  GATED LEFT VENTRICULAR WALL MOTION STUDY  LEFT VENTRICULAR EJECTION FRACTION  TECHNIQUE: Standard myocardial SPECT imaging performed after resting intravenous injection of 10 mCi Tc-67m sestimibi. Subsequently, intravenous infusion of regadenoson performed under the supervision of the Cardiology staff. At peak effect of the drug, 30 mCi Tc-74m sestimibi injected intravenously and standard myocardial SPECT imaging performed. Quantitative gated imaging also performed to evaluate left ventricular wall motion and estimate left ventricular ejection fraction.  FINDINGS: MYOCARDIAL IMAGING WITH SPECT (REST AND PHARMACOLOGIC-STRESS)  There is no evidence for reversibility. There is slightly decreased uptake along the inferior wall on both the stress and rest images. Normal wall motion in the inferior wall and suspect diaphragmatic attenuation.  GATED LEFT VENTRICULAR WALL MOTION STUDY  Review of the gated images demonstrates  normal wall motion.  LEFT VENTRICULAR EJECTION FRACTION  QGS ejection fraction measures 64% , with an end-diastolic volume of 90 ml and an end-systolic volume of 32 ml.  IMPRESSION: No evidence for  pharmacological induced ischemia.  Calculated ejection fraction is 64%   Electronically Signed   By: Richarda OverlieAdam  Henn M.D.   On: 07/11/2014 12:24   Dg Chest Port 1 View  07/05/2014   CLINICAL DATA:  Dialysis catheter placed  EXAM: PORTABLE CHEST - 1 VIEW  COMPARISON:  07/02/2014  FINDINGS: There is a right IJ dialysis catheter, tip in the upper right atrium or cavoatrial junction. No pneumothorax, new mediastinal widening, or pleural effusion.  Mild cardiomegaly which is stable from prior. Mild pulmonary hyperinflation. No edema. Patchy sclerosis in the right humeral head which may reflect AVN.  IMPRESSION: New right IJ dialysis catheter without adverse feature.   Electronically Signed   By: Tiburcio PeaJonathan  Watts M.D.   On: 07/05/2014 23:29    Microbiology: Recent Results (from the past 240 hour(s))  MRSA PCR SCREENING     Status: None   Collection Time    07/05/14  3:26 PM      Result Value Ref Range Status   MRSA by PCR NEGATIVE  NEGATIVE Final   Comment:            The GeneXpert MRSA Assay (FDA     approved for NASAL specimens     only), is one component of a     comprehensive MRSA colonization     surveillance program. It is not     intended to diagnose MRSA     infection nor to guide or     monitor treatment for     MRSA infections.  CULTURE, BLOOD (ROUTINE X 2)     Status: None   Collection Time    07/05/14  5:20 PM      Result Value Ref Range Status   Specimen Description BLOOD RIGHT FOREARM   Final   Special Requests BOTTLES DRAWN AEROBIC ONLY 5CC   Final   Culture  Setup Time     Final   Value: 07/06/2014 00:54     Performed at [facility]   Culture     Final   Value: NO GROWTH 5 DAYS     Performed at [facility]   Report Status 07/12/2014 FINAL   Final  CULTURE, BLOOD  (ROUTINE X 2)     Status: None   Collection Time    07/05/14  5:25 PM      Result Value Ref Range Status   Specimen Description BLOOD LEFT ARM   Final   Special Requests BOTTLES DRAWN AEROBIC ONLY 6CC   Final   Culture  Setup Time     Final   Value: 07/06/2014 00:54     Performed at [facility]   Culture     Final   Value: NO GROWTH 5 DAYS     Performed at [facility]   Report Status 07/12/2014 FINAL   Final  URINE CULTURE     Status: None   Collection Time    07/06/14  1:12 AM      Result Value Ref Range Status   Specimen Description URINE, RANDOM   Final   Special Requests NONE   Final   Culture  Setup Time     Final   Value: 07/06/2014 01:59     Performed at [facility]   Colony Count     Final   Value: >=100,000 COLONIES/ML     Performed at [facility]   Culture     Final   Value: ENTEROCOCCUS SPECIES  Performed at [facility]   Report Status 07/08/2014 FINAL   Final   Organism ID, Bacteria ENTEROCOCCUS SPECIES   Final     Labs: Basic Metabolic Panel:  Recent Labs Lab 07/07/14 0745 07/08/14 0500 07/09/14 0410 07/10/14 0310 07/11/14 0532 07/12/14 0515 07/13/14 0415  NA 144 142 140 143 145 146 147  K 3.6* 3.7 3.7 4.0 3.7 3.9 3.3*  CL 102 100 98 96 99 102 104  CO2 25 26 23 26 28 27 27   GLUCOSE 109* 109* 104* 103* 97 107* 86  BUN 76* 63* 88* 106* 113* 103* 99*  CREATININE 4.24* 3.66* 4.35* 4.71* 4.21* 3.43* 3.32*  CALCIUM 7.7* 8.0* 8.2* 8.9 8.5 7.8* 8.2*  PHOS 7.9* 7.4* 7.5* 7.6*  --   --   --    Liver Function Tests:  Recent Labs Lab 07/07/14 0745 07/08/14 0500 07/09/14 0410 07/10/14 0310  ALBUMIN 3.0* 2.9* 2.9* 3.5   No results found for this basename: LIPASE, AMYLASE,  in the last 168 hours No results found for this basename: AMMONIA,  in the last 168 hours CBC:  Recent Labs Lab 07/09/14 0410 07/10/14 0310 07/11/14 0532 07/12/14 0515 07/13/14 0415  WBC 11.9* 14.1* 14.1* 11.5* 14.0*  HGB 8.1*  9.4* 8.6* 7.9* 8.3*  HCT 23.9* 27.4* 25.5* 23.7* 25.1*  MCV 87.5 87.0 87.9 89.1 90.3  PLT 164 209 185 167 166   Cardiac Enzymes: No results found for this basename: CKTOTAL, CKMB, CKMBINDEX, TROPONINI,  in the last 168 hours BNP: BNP (last 3 results) No results found for this basename: PROBNP,  in the last 8760 hours CBG: No results found for this basename: GLUCAP,  in the last 168 hours     Signed:  Esperanza Sheets  [facility] 07/13/2014, 12:33 PM

## 2014-07-13 NOTE — Progress Notes (Signed)
R IJ D/C per MD order. vaseline pressure dsg applied/pressure x69min/no bleeding noted. Ronette Deter, RN IV Team

## 2014-07-13 NOTE — Discharge Instructions (Signed)
Please follow up with primary care doctor in 1 week  Please follow up with nephrologist in 1-2 weeks as outpatient

## 2014-07-13 NOTE — Progress Notes (Signed)
Subjective: Interval History: has no complaint, ready to go.  Objective: Vital signs in last 24 hours: Temp:  [98.4 F (36.9 C)-98.6 F (37 C)] 98.4 F (36.9 C) (08/10 0630) Pulse Rate:  [76-86] 84 (08/10 0630) Resp:  [16-18] 18 (08/10 0630) BP: (159-186)/(54-69) 170/66 mmHg (08/10 0630) SpO2:  [94 %-99 %] 96 % (08/10 0740) Weight change:   Intake/Output from previous day: 08/09 0701 - 08/10 0700 In: 810 [P.O.:800; I.V.:10] Out: 2100 [Urine:2100] Intake/Output this shift: Total I/O In: 360 [P.O.:360] Out: -   General appearance: alert, cooperative, moderately obese and pale Neck: RIJ cath Resp: diminished breath sounds bilaterally and wheezes bilaterally Cardio: S1, S2 normal and systolic murmur: holosystolic 2/6, blowing at apex GI: obese, pos bs,liver down 4 cm Extremities: extremities normal, atraumatic, no cyanosis or edema  Lab Results:  Recent Labs  07/12/14 0515 07/13/14 0415  WBC 11.5* 14.0*  HGB 7.9* 8.3*  HCT 23.7* 25.1*  PLT 167 166   BMET:  Recent Labs  07/12/14 0515 07/13/14 0415  NA 146 147  K 3.9 3.3*  CL 102 104  CO2 27 27  GLUCOSE 107* 86  BUN 103* 99*  CREATININE 3.43* 3.32*  CALCIUM 7.8* 8.2*   No results found for this basename: PTH,  in the last 72 hours Iron Studies: No results found for this basename: IRON, TIBC, TRANSFERRIN, FERRITIN,  in the last 72 hours  Studies/Results: Nm Myocar Multi W/spect W/wall Motion / Ef  07/11/2014   CLINICAL DATA:  73 year old with chest pain.  EXAM: MYOCARDIAL IMAGING WITH SPECT (REST AND PHARMACOLOGIC-STRESS)  GATED LEFT VENTRICULAR WALL MOTION STUDY  LEFT VENTRICULAR EJECTION FRACTION  TECHNIQUE: Standard myocardial SPECT imaging performed after resting intravenous injection of 10 mCi Tc-21m sestimibi. Subsequently, intravenous infusion of regadenoson performed under the supervision of the Cardiology staff. At peak effect of the drug, 30 mCi Tc-68m sestimibi injected intravenously and standard  myocardial SPECT imaging performed. Quantitative gated imaging also performed to evaluate left ventricular wall motion and estimate left ventricular ejection fraction.  FINDINGS: MYOCARDIAL IMAGING WITH SPECT (REST AND PHARMACOLOGIC-STRESS)  There is no evidence for reversibility. There is slightly decreased uptake along the inferior wall on both the stress and rest images. Normal wall motion in the inferior wall and suspect diaphragmatic attenuation.  GATED LEFT VENTRICULAR WALL MOTION STUDY  Review of the gated images demonstrates normal wall motion.  LEFT VENTRICULAR EJECTION FRACTION  QGS ejection fraction measures 64% , with an end-diastolic volume of 90 ml and an end-systolic volume of 32 ml.  IMPRESSION: No evidence for pharmacological induced ischemia.  Calculated ejection fraction is 64%   Electronically Signed   By: Richarda Overlie M.D.   On: 07/11/2014 12:24    I have reviewed the patient's current medications.  Assessment/Plan: 1 CKD with AKI slowly better.  Not clear what will plateau at.  Acid base ok. K low,replete. 2 HTn will ^ Lasix 3 Anemia give 1 dose epo and f/u 4 IVC filter 5 CHF resolve 6 COPD on Pred, bronchodilators 7 Gout 8 HPTH P ^ Lasix, K given, pull cath, will s/o and see again at your request,  Desires to f/u here. Will set up with Dr. Briant Cedar    LOS: 8 days   Sallie Staron L 07/13/2014,9:28 AM

## 2014-08-03 ENCOUNTER — Encounter: Payer: Self-pay | Admitting: *Deleted

## 2014-08-03 ENCOUNTER — Other Ambulatory Visit: Payer: Self-pay | Admitting: *Deleted

## 2014-08-04 ENCOUNTER — Ambulatory Visit (INDEPENDENT_AMBULATORY_CARE_PROVIDER_SITE_OTHER): Payer: Medicare Other | Admitting: Cardiology

## 2014-08-04 ENCOUNTER — Encounter: Payer: Self-pay | Admitting: Cardiology

## 2014-08-04 VITALS — BP 118/60 | HR 75 | Ht 63.0 in | Wt 197.0 lb

## 2014-08-04 DIAGNOSIS — I6350 Cerebral infarction due to unspecified occlusion or stenosis of unspecified cerebral artery: Secondary | ICD-10-CM

## 2014-08-04 DIAGNOSIS — I214 Non-ST elevation (NSTEMI) myocardial infarction: Secondary | ICD-10-CM

## 2014-08-04 LAB — BASIC METABOLIC PANEL
BUN: 35 mg/dL — ABNORMAL HIGH (ref 6–23)
CO2: 30 mEq/L (ref 19–32)
Calcium: 8.8 mg/dL (ref 8.4–10.5)
Chloride: 103 mEq/L (ref 96–112)
Creatinine, Ser: 2.8 mg/dL — ABNORMAL HIGH (ref 0.4–1.2)
GFR: 17.38 mL/min — ABNORMAL LOW (ref >60.00)
Glucose, Bld: 77 mg/dL (ref 70–99)
Potassium: 3.5 mEq/L (ref 3.5–5.1)
Sodium: 142 mEq/L (ref 135–145)

## 2014-08-04 MED ORDER — NEBIVOLOL HCL 10 MG PO TABS: 10.0000 mg | ORAL_TABLET | Freq: Every day | ORAL | Status: DC

## 2014-08-04 NOTE — Patient Instructions (Addendum)
Your physician has recommended you make the following change in your medication:   STOP TAKING LABETALOL NOW  STOP TAKING METOPROLOL NOW  START TAKING BYSTOLIC 10 MG DAILY  You have been referred to TO CARDIAC REHAB  Your physician recommends that you return for lab work in: TODAY (BMET)   Your physician recommends that you schedule a follow-up appointment in: 6 WEEKS WITH DR Delton See

## 2014-08-04 NOTE — Progress Notes (Signed)
Patient ID: [name] [name], female   DOB: March 13, 1941, 73 y.o.   MRN: [id number]   Patient Name: [name] [name] Date of Encounter: 08/04/2014  Primary Care Provider:  Konrad Felix, MD Primary Cardiologist:  Lars Masson  Problem List   Past Medical History  Diagnosis Date  . Stroke   . Hypertension   . MI (myocardial infarction)   . History of blood clots   . Renal disorder   . High cholesterol   . COPD (chronic obstructive pulmonary disease)   . Shortness of breath   . Asthma   . GERD (gastroesophageal reflux disease)    Past Surgical History  Procedure Laterality Date  . Abdominal hysterectomy     Allergies  Allergies  Allergen Reactions  . 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 Despite stopping Lasix and ACE I her renal function progressively worsened to a peak of ~7 (baseline of 1.2-2.5).  -After arrival to [facility] Nephrology was consulted and emergent HD was initiated. Cardiac enzymes were also checked and were elevated in the absence of EKG changes. Cardiology was consulted. ECHO was WNL except for grade 1 diastolic dysfunction. IV Heparin was started. She stabilized and was able to transfer out to SDU.  NSTEMI; chest pain had resolved, cont Imdur,ASA and statin;added BB. -8/8: stress test: No evidence for pharmacological induced ischemia. Calculated ejection fraction is 64%  Acute on chronic diastolic CHF  -Echocardiogram WNL except for grade 1 diastolic dysfunction; cont diuresis; stable.  Post - hospitalization follow up - 08/04/2014  08/04/2014 - the patient is complaining of feeling tired, weak, insomnia. No chest pain or resting SOB, positive DOE. Her lasix was recently increased to 80 mg po daily for LE edema and  DOE, with some improvement in symptoms. She continues to smoke.    Home Medications  Prior to Admission medications   Medication Sig Start Date End Date Taking? Authorizing Provider  albuterol (PROVENTIL) (2.5 MG/3ML) 0.083% nebulizer solution Take 3 mLs (2.5 mg total) by nebulization every 4 (four) hours as needed for wheezing or shortness of breath. 07/13/14  Yes Esperanza Sheets, MD  amLODipine (NORVASC) 10 MG tablet Take 1 tablet (10 mg total) by mouth daily. 07/13/14  Yes Esperanza Sheets, MD  aspirin EC 81 MG EC tablet Take 1 tablet (81 mg total) by mouth daily. 07/13/14  Yes Esperanza Sheets, MD  atorvastatin (LIPITOR) 40 MG tablet Take 1 tablet (40 mg total) by mouth at bedtime. 07/13/14  Yes Esperanza Sheets, MD  calcitRIOL (ROCALTROL) 0.25 MCG capsule Take 1 capsule by mouth daily. 07/30/14  Yes Historical Provider, MD  Calcium-Magnesium-Vitamin D (CALCIUM 500 PO) Take 500 mg by mouth daily.   Yes Historical Provider, MD  cloNIDine (CATAPRES) 0.1 MG tablet Take 1 tablet (0.1 mg total) by mouth every 4 (four) hours as needed (SBP > 170 or DBP >110). 07/13/14  Yes Esperanza Sheets, MD  cyproheptadine (PERIACTIN) 4 MG tablet Take 1 tablet by mouth daily. 05/24/14  Yes Historical Provider, MD  doxazosin (CARDURA) 4 MG tablet Take 1 tablet by mouth daily. 06/28/14  Yes Historical Provider, MD  escitalopram (LEXAPRO) 10 MG tablet Take 1 tablet by mouth daily. 07/29/14  Yes Historical Provider, MD  fluticasone (CUTIVATE) 0.05 %  cream as directed. 05/12/14  Yes Historical Provider, MD  Fluticasone-Salmeterol (ADVAIR) 250-50 MCG/DOSE AEPB Inhale 1 puff into the lungs every 12 (twelve) hours. 07/13/14  Yes Esperanza Sheets, MD  furosemide (LASIX) 80 MG tablet Take 1 tablet (80 mg total) by mouth daily. 07/13/14  Yes Esperanza Sheets, MD  guaiFENesin (MUCINEX) 600 MG 12 hr tablet Take 1,200 mg by mouth every 12 (twelve) hours as needed for congestion.   Yes Historical Provider, MD  hydrALAZINE (APRESOLINE) 50  MG tablet Take 1 tablet by mouth daily. 06/07/14  Yes Historical Provider, MD  HYDROcodone-acetaminophen (NORCO/VICODIN) 5-325 MG per tablet Take 1 tablet by mouth every 6 (six) hours as needed for moderate pain. 07/13/14  Yes Esperanza Sheets, MD  hydrOXYzine (ATARAX/VISTARIL) 25 MG tablet Take 1 tablet by mouth as needed. 05/12/14  Yes Historical Provider, MD  ipratropium (ATROVENT) 0.02 % nebulizer solution Take 2.5 mLs (500 mcg total) by nebulization every 6 (six) hours as needed for wheezing ((with Xopenex)). 07/13/14  Yes Esperanza Sheets, MD  isosorbide mononitrate (IMDUR) 60 MG 24 hr tablet Take 1 tablet (60 mg total) by mouth daily. 07/13/14  Yes Esperanza Sheets, MD  labetalol (NORMODYNE) 300 MG tablet Take 1 tablet by mouth daily. 07/21/14  Yes Historical Provider, MD  metoprolol (LOPRESSOR) 50 MG tablet Take 1 tablet (50 mg total) by mouth 2 (two) times daily. 07/13/14  Yes Esperanza Sheets, MD  montelukast (SINGULAIR) 10 MG tablet Take 1 tablet by mouth daily. 07/26/14  Yes Historical Provider, MD  nitroGLYCERIN (NITROSTAT) 0.4 MG SL tablet Place 0.4 mg under the tongue every 5 (five) minutes as needed for chest pain.   Yes Historical Provider, MD  omeprazole (PRILOSEC) 20 MG capsule Take 20 mg by mouth daily.   Yes Historical Provider, MD  potassium chloride (K-DUR) 10 MEQ tablet Take 1 tablet (10 mEq total) by mouth daily. 07/13/14  Yes Esperanza Sheets, MD  predniSONE (DELTASONE) 10 MG tablet Take 1 tablet (10 mg total) by mouth daily with breakfast. 07/13/14  Yes Esperanza Sheets, MD  tretinoin (RETIN-A) 0.1 % cream Apply 1 application topically as directed.   Yes Historical Provider, MD    Family History  Family History  Problem Relation Age of Onset  . Heart attack Father     in his 37s  . Heart failure Mother     died of CHF 10 yrs ago  . Congestive Heart Failure Mother     Social History  History   Social History  . Marital Status: Divorced    Spouse Name: N/A    Number of  Children: N/A  . Years of Education: N/A   Occupational History  . Not on file.   Social History Main Topics  . Smoking status: Former Smoker    Quit date: 02/01/2013  . Smokeless tobacco: Not on file     Comment: started at age 31, 1-2ppd  . Alcohol Use: No  . Drug Use: No  . Sexual Activity: No   Other Topics Concern  . Not on file   Social History Narrative  . No narrative on file     Review of Systems, as per HPI, otherwise negative General:  No chills, fever, night sweats or weight changes.  Cardiovascular:  No chest pain, dyspnea on exertion, edema, orthopnea, palpitations, paroxysmal nocturnal dyspnea. Dermatological: No rash, lesions/masses Respiratory: No cough, dyspnea Urologic: No hematuria, dysuria Abdominal:   No nausea, vomiting, diarrhea, bright red blood per  rectum, melena, or hematemesis Neurologic:  No visual changes, wkns, changes in mental status. All other systems reviewed and are otherwise negative except as noted above.  Physical Exam  Blood pressure 118/60, pulse 75, height  (1.6 m), weight 197 lb (89.359 kg), SpO2 95.00%.  General: Pleasant, NAD Psych: Normal affect. Neuro: Alert and oriented X 3. Moves all extremities spontaneously. HEENT: Normal  Neck: Supple without bruits or JVD. Lungs:  Resp regular and unlabored, end-expiratory wheezing. Heart: RRR no s3, s4, or murmurs. Abdomen: Soft, non-tender, non-distended, BS + x 4.  Extremities: No clubbing, cyanosis, mild B/L edema. DP/PT/Radials 2+ and equal bilaterally.  Labs:  No results found for this basename: CKTOTAL, CKMB, TROPONINI,  in the last 72 hours Lab Results  Component Value Date   WBC 14.0* 07/13/2014   HGB 8.3* 07/13/2014   HCT 25.1* 07/13/2014   MCV 90.3 07/13/2014   PLT 166 07/13/2014    No results found for this basename: DDIMER   No components found with this basename: POCBNP,     Component Value Date/Time   NA 147 07/13/2014 0415   K 3.3* 07/13/2014 0415   CL  104 07/13/2014 0415   CO2 27 07/13/2014 0415   GLUCOSE 86 07/13/2014 0415   BUN 99* 07/13/2014 0415   CREATININE 3.32* 07/13/2014 0415   CALCIUM 8.2* 07/13/2014 0415   PROT 5.5* 07/05/2014 1725   ALBUMIN 3.5 07/10/2014 0310   AST 20 07/05/2014 1725   ALT 21 07/05/2014 1725   ALKPHOS 89 07/05/2014 1725   BILITOT <0.2* 07/05/2014 1725   GFRNONAA 13* 07/13/2014 0415   GFRAA 15* 07/13/2014 0415   Accessory Clinical Findings  Echocardiogram   - Left ventricle: The cavity size was normal. There was moderate concentric hypertrophy. Systolic function was normal. The estimated ejection fraction was in the range of 60% to 65%. Wall motion was normal; there were no regional wall motion abnormalities. Doppler parameters are consistent with abnormal left ventricular relaxation (grade 1 diastolic dysfunction). There was no evidence of elevated ventricular filling pressure by Doppler parameters. - Aortic valve: Trileaflet; normal thickness leaflets. Transvalvular velocity was within the normal range. There was no stenosis. There was no regurgitation. - Aortic root: The aortic root was normal in size. - Mitral valve: Structurally normal valve. There was no regurgitation. - Right ventricle: Systolic function was normal. - Right atrium: The atrium was normal in size. - Tricuspid valve: There was no regurgitation. - Pulmonary arteries: Systolic pressure was within the normal range.  Impressions: - Moderate LVH with abnormal relaxation. Otherwise normal study.    Assessment & Plan  1. CAD - NSTEMI, negative stress test, normal echocardiogram, preserved LVEF - Echo 07/07/2014 EF 60-65%, moderate LVH, grade 1 diastolic dysfunction  - she appears very deconditioned, I offered cardiac rehab, she refused  2. Chronic diastolic CHF - the patient has only midl LE edema, grade 1 DD, I believe that her SOB is mostly secondary to her COPD, active wheezing, the patient continues to smoke  3. Acute on chronic renal  insufficiency - crea improved on 8/17 3.3 --> 2.3, - since increased dose of Lasix we will check BMP today  4. COPD  - on Advair BID and albuterol PRN - I will stop labetalol and metoprolol to Bystolic 10 mg po daily  5. Tobacco abuse: has been smoking her whole life, no intention to quit  6. 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 Hypertension - controlled  Follow up  in 6 weeks  Lars Masson, MD, [facility] 08/04/2014, 12:03 PM

## 2014-08-20 ENCOUNTER — Telehealth: Payer: Self-pay | Admitting: Cardiology

## 2014-08-20 NOTE — Telephone Encounter (Signed)
° °  Patient takes bystolic & Klonodine and wants to make sure she can take them at the same time. Please call and advise.

## 2014-08-20 NOTE — Telephone Encounter (Signed)
Pt calling to clarify if she is supposed to be taking Clonidine 0.1 mg and Bystolic at the same time.  Informed the pt that she is to take her Bystolic 10 mg po daily  and take clonidine 0.1 mg po every 4 hours PRN for SBP >170 AND DBP >110.   Informed the pt that she should not be taking clonidine on a daily basis, and only when meets the above parameters mentioned.  Pt verbalized understanding and very grateful for all the assistance provided.

## 2014-09-07 ENCOUNTER — Other Ambulatory Visit: Payer: Self-pay | Admitting: *Deleted

## 2014-09-09 ENCOUNTER — Encounter: Payer: Self-pay | Admitting: Cardiology

## 2014-09-09 ENCOUNTER — Ambulatory Visit (INDEPENDENT_AMBULATORY_CARE_PROVIDER_SITE_OTHER): Payer: Medicare Other | Admitting: Cardiology

## 2014-09-09 VITALS — BP 120/66 | HR 64 | Ht 63.0 in | Wt 194.0 lb

## 2014-09-09 DIAGNOSIS — J441 Chronic obstructive pulmonary disease with (acute) exacerbation: Secondary | ICD-10-CM

## 2014-09-09 DIAGNOSIS — I10 Essential (primary) hypertension: Secondary | ICD-10-CM

## 2014-09-09 DIAGNOSIS — I2699 Other pulmonary embolism without acute cor pulmonale: Secondary | ICD-10-CM

## 2014-09-09 DIAGNOSIS — Z79899 Other long term (current) drug therapy: Secondary | ICD-10-CM

## 2014-09-09 DIAGNOSIS — I5030 Unspecified diastolic (congestive) heart failure: Secondary | ICD-10-CM

## 2014-09-09 DIAGNOSIS — N19 Unspecified kidney failure: Secondary | ICD-10-CM

## 2014-09-09 DIAGNOSIS — N179 Acute kidney failure, unspecified: Secondary | ICD-10-CM

## 2014-09-09 DIAGNOSIS — I639 Cerebral infarction, unspecified: Secondary | ICD-10-CM

## 2014-09-09 DIAGNOSIS — R5383 Other fatigue: Secondary | ICD-10-CM

## 2014-09-09 LAB — COMPREHENSIVE METABOLIC PANEL
ALT: 15 U/L (ref 0–35)
AST: 21 U/L (ref 0–37)
Albumin: 3.8 g/dL (ref 3.5–5.2)
Alkaline Phosphatase: 118 U/L — ABNORMAL HIGH (ref 39–117)
BUN: 35 mg/dL — ABNORMAL HIGH (ref 6–23)
CO2: 30 mEq/L (ref 19–32)
Calcium: 9.2 mg/dL (ref 8.4–10.5)
Chloride: 100 mEq/L (ref 96–112)
Creatinine, Ser: 2.6 mg/dL — ABNORMAL HIGH (ref 0.4–1.2)
GFR: 19.33 mL/min — ABNORMAL LOW (ref >60.00)
Glucose, Bld: 118 mg/dL — ABNORMAL HIGH (ref 70–99)
Potassium: 3.8 mEq/L (ref 3.5–5.1)
Sodium: 141 mEq/L (ref 135–145)
Total Bilirubin: 0.5 mg/dL (ref 0.2–1.2)
Total Protein: 7.5 g/dL (ref 6.0–8.3)

## 2014-09-09 LAB — TSH: TSH: 0.89 u[IU]/mL (ref 0.35–4.50)

## 2014-09-09 LAB — CORTISOL: Cortisol, Plasma: 10.9 ug/dL

## 2014-09-09 NOTE — Patient Instructions (Signed)
Your physician recommends that you continue on your current medications as directed. Please refer to the Current Medication list given to you today.   Your physician recommends that you return for lab work in: TODAY (CORTISOL, CMET, TSH)  Your physician wants you to follow-up in: 6 MONTHS WITH DR Johnell Comings will receive a reminder letter in the mail two months in advance. If you don't receive a letter, please call our office to schedule the follow-up appointment.

## 2014-09-09 NOTE — Progress Notes (Signed)
Patient ID: [name] [name], female   DOB: 1941/03/25, 73 y.o.   MRN: [id number]    Patient Name: [name] [name] Date of Encounter: 09/09/2014  Primary Care Provider:  Konrad Felix, MD Primary Cardiologist:  Lars Masson  Problem List   Past Medical History  Diagnosis Date  . Stroke   . Hypertension   . MI (myocardial infarction)   . History of blood clots   . Renal disorder   . High cholesterol   . COPD (chronic obstructive pulmonary disease)   . Shortness of breath   . Asthma   . GERD (gastroesophageal reflux disease)    Past Surgical History  Procedure Laterality Date  . Abdominal hysterectomy     Allergies  Allergies  Allergen Reactions  . 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 Despite stopping Lasix and ACE I her renal function progressively worsened to a peak of ~7 (baseline of 1.2-2.5).  -After arrival to [facility] Nephrology was consulted and emergent HD was initiated. Cardiac enzymes were also checked and were elevated in the absence of EKG changes. Cardiology was consulted. ECHO was WNL except for grade 1 diastolic dysfunction. IV Heparin was started. She stabilized and was able to transfer out to SDU.  NSTEMI; chest pain had resolved, cont Imdur,ASA and statin;added BB. -8/8: stress test: No evidence for pharmacological induced ischemia. Calculated ejection fraction is 64%  Acute on chronic diastolic CHF  -Echocardiogram WNL except for grade 1 diastolic dysfunction; cont diuresis; stable.  Post - hospitalization follow up - 08/04/2014  08/04/2014 - the patient is complaining of feeling tired, weak, insomnia. No chest pain or resting SOB, positive DOE. Her lasix was recently increased to 80 mg po daily for LE edema and  DOE, with some improvement in symptoms. She continues to smoke.   09/09/2014 - the patient feels exhausted all the time, no chest pain, no resting SOB, stable exertional SOB. She has been treated for anemia with improvement of Hb levels, however no improvement in symptoms. She is refusing to go to cardiac rehab. No orthopnea, PND or LE edema. Lasix was changed from 80 mg PO BID to 80 mg in the AM and 40 mg in the PM by her nephrologist. No K as she had hyperkalemia on the lab from 2 weeks ago (not in our system). She brings BP diary and controlled most of the time.    Home Medications  Prior to Admission medications   Medication Sig Start Date End Date Taking? Authorizing Provider  albuterol (PROVENTIL) (2.5 MG/3ML) 0.083% nebulizer solution Take 3 mLs (2.5 mg total) by nebulization every 4 (four) hours as needed for wheezing or shortness of breath. 07/13/14  Yes Esperanza Sheets, MD  amLODipine (NORVASC) 10 MG tablet Take 1 tablet (10 mg total) by mouth daily. 07/13/14  Yes Esperanza Sheets, MD  aspirin EC 81 MG EC tablet Take 1 tablet (81 mg total) by mouth daily. 07/13/14  Yes Esperanza Sheets, MD  atorvastatin (LIPITOR) 40 MG tablet Take 1 tablet (40 mg total) by mouth at bedtime. 07/13/14  Yes Esperanza Sheets, MD  calcitRIOL (ROCALTROL) 0.25 MCG capsule Take 1 capsule by mouth daily. 07/30/14  Yes Historical Provider, MD  Calcium-Magnesium-Vitamin D (CALCIUM 500 PO) Take 500 mg  by mouth daily.   Yes Historical Provider, MD  cloNIDine (CATAPRES) 0.1 MG tablet Take 1 tablet (0.1 mg total) by mouth every 4 (four) hours as needed (SBP > 170 or DBP >110). 07/13/14  Yes Esperanza Sheets, MD  cyproheptadine (PERIACTIN) 4 MG tablet Take 1 tablet by mouth daily. 05/24/14  Yes Historical Provider, MD  doxazosin (CARDURA) 4 MG tablet Take 1 tablet by mouth daily. 06/28/14  Yes Historical Provider, MD  escitalopram (LEXAPRO) 10 MG tablet Take 1 tablet by mouth daily. 07/29/14  Yes Historical Provider, MD    fluticasone (CUTIVATE) 0.05 % cream as directed. 05/12/14  Yes Historical Provider, MD  Fluticasone-Salmeterol (ADVAIR) 250-50 MCG/DOSE AEPB Inhale 1 puff into the lungs every 12 (twelve) hours. 07/13/14  Yes Esperanza Sheets, MD  furosemide (LASIX) 80 MG tablet Take 1 tablet (80 mg total) by mouth daily. 07/13/14  Yes Esperanza Sheets, MD  guaiFENesin (MUCINEX) 600 MG 12 hr tablet Take 1,200 mg by mouth every 12 (twelve) hours as needed for congestion.   Yes Historical Provider, MD  hydrALAZINE (APRESOLINE) 50 MG tablet Take 1 tablet by mouth daily. 06/07/14  Yes Historical Provider, MD  HYDROcodone-acetaminophen (NORCO/VICODIN) 5-325 MG per tablet Take 1 tablet by mouth every 6 (six) hours as needed for moderate pain. 07/13/14  Yes Esperanza Sheets, MD  hydrOXYzine (ATARAX/VISTARIL) 25 MG tablet Take 1 tablet by mouth as needed. 05/12/14  Yes Historical Provider, MD  ipratropium (ATROVENT) 0.02 % nebulizer solution Take 2.5 mLs (500 mcg total) by nebulization every 6 (six) hours as needed for wheezing ((with Xopenex)). 07/13/14  Yes Esperanza Sheets, MD  isosorbide mononitrate (IMDUR) 60 MG 24 hr tablet Take 1 tablet (60 mg total) by mouth daily. 07/13/14  Yes Esperanza Sheets, MD  labetalol (NORMODYNE) 300 MG tablet Take 1 tablet by mouth daily. 07/21/14  Yes Historical Provider, MD  metoprolol (LOPRESSOR) 50 MG tablet Take 1 tablet (50 mg total) by mouth 2 (two) times daily. 07/13/14  Yes Esperanza Sheets, MD  montelukast (SINGULAIR) 10 MG tablet Take 1 tablet by mouth daily. 07/26/14  Yes Historical Provider, MD  nitroGLYCERIN (NITROSTAT) 0.4 MG SL tablet Place 0.4 mg under the tongue every 5 (five) minutes as needed for chest pain.   Yes Historical Provider, MD  omeprazole (PRILOSEC) 20 MG capsule Take 20 mg by mouth daily.   Yes Historical Provider, MD  potassium chloride (K-DUR) 10 MEQ tablet Take 1 tablet (10 mEq total) by mouth daily. 07/13/14  Yes Esperanza Sheets, MD  predniSONE (DELTASONE) 10 MG  tablet Take 1 tablet (10 mg total) by mouth daily with breakfast. 07/13/14  Yes Esperanza Sheets, MD  tretinoin (RETIN-A) 0.1 % cream Apply 1 application topically as directed.   Yes Historical Provider, MD    Family History  Family History  Problem Relation Age of Onset  . Heart attack Father     in his 56s  . Heart failure Mother     died of CHF 10 yrs ago  . Congestive Heart Failure Mother     Social History  History   Social History  . Marital Status: Divorced    Spouse Name: N/A    Number of Children: N/A  . Years of Education: N/A   Occupational History  . Not on file.   Social History Main Topics  . Smoking status: Former Smoker    Quit date: 02/01/2013  . Smokeless tobacco: Not on file  Comment: started at age 9, 1-2ppd  . Alcohol Use: No  . Drug Use: No  . Sexual Activity: No   Other Topics Concern  . Not on file   Social History Narrative  . No narrative on file     Review of Systems, as per HPI, otherwise negative General:  No chills, fever, night sweats or weight changes.  Cardiovascular:  No chest pain, dyspnea on exertion, edema, orthopnea, palpitations, paroxysmal nocturnal dyspnea. Dermatological: No rash, lesions/masses Respiratory: No cough, dyspnea Urologic: No hematuria, dysuria Abdominal:   No nausea, vomiting, diarrhea, bright red blood per rectum, melena, or hematemesis Neurologic:  No visual changes, wkns, changes in mental status. All other systems reviewed and are otherwise negative except as noted above.  Physical Exam  Blood pressure 120/66, pulse 64, height 5\' 3"  (1.6 m), weight 194 lb (87.998 kg).  General: Pleasant, NAD Psych: Normal affect. Neuro: Alert and oriented X 3. Moves all extremities spontaneously. HEENT: Normal  Neck: Supple without bruits or JVD. Lungs:  Resp regular and unlabored, end-expiratory wheezing. Heart: RRR no s3, s4, or murmurs. Abdomen: Soft, non-tender, non-distended, BS + x 4.  Extremities: No  clubbing, cyanosis, no edema. DP/PT/Radials 2+ and equal bilaterally.  Labs:  No results found for this basename: CKTOTAL, CKMB, TROPONINI,  in the last 72 hours Lab Results  Component Value Date   WBC 14.0* 07/13/2014   HGB 8.3* 07/13/2014   HCT 25.1* 07/13/2014   MCV 90.3 07/13/2014   PLT 166 07/13/2014    No results found for this basename: DDIMER   No components found with this basename: POCBNP,     Component Value Date/Time   NA 142 08/04/2014 1243   K 3.5 08/04/2014 1243   CL 103 08/04/2014 1243   CO2 30 08/04/2014 1243   GLUCOSE 77 08/04/2014 1243   BUN 35* 08/04/2014 1243   CREATININE 2.8* 08/04/2014 1243   CALCIUM 8.8 08/04/2014 1243   PROT 5.5* 07/05/2014 1725   ALBUMIN 3.5 07/10/2014 0310   AST 20 07/05/2014 1725   ALT 21 07/05/2014 1725   ALKPHOS 89 07/05/2014 1725   BILITOT <0.2* 07/05/2014 1725   GFRNONAA 13* 07/13/2014 0415   GFRAA 15* 07/13/2014 0415   Accessory Clinical Findings  Echocardiogram   - Left ventricle: The cavity size was normal. There was moderate concentric hypertrophy. Systolic function was normal. The estimated ejection fraction was in the range of 60% to 65%. Wall motion was normal; there were no regional wall motion abnormalities. Doppler parameters are consistent with abnormal left ventricular relaxation (grade 1 diastolic dysfunction). There was no evidence of elevated ventricular filling pressure by Doppler parameters. - Aortic valve: Trileaflet; normal thickness leaflets. Transvalvular velocity was within the normal range. There was no stenosis. There was no regurgitation. - Aortic root: The aortic root was normal in size. - Mitral valve: Structurally normal valve. There was no regurgitation. - Right ventricle: Systolic function was normal. - Right atrium: The atrium was normal in size. - Tricuspid valve: There was no regurgitation. - Pulmonary arteries: Systolic pressure was within the normal range.  Impressions: - Moderate LVH with abnormal  relaxation. Otherwise normal study.    Assessment & Plan  1. CAD - NSTEMI, negative stress test, normal echocardiogram, preserved LVEF - Echo 07/07/2014 EF 60-65%, moderate LVH, grade 1 diastolic dysfunction  - she appears very deconditioned, I offered cardiac rehab again, she refused  2. Fatigue - we will check cortisol level and TSH  3. Chronic diastolic CHF -  no edema, weight 294, previously 197, grade 1 DD  4. Acute on chronic renal insufficiency - crea improved on 8/17 3.3 --> 2.3-->2.8, also followed by a nephrologist - since increased dose of Lasix we will check BMP today  5. COPD  - on Advair BID and albuterol PRN - I will stop labetalol and metoprolol to Bystolic 10 mg po daily, wheezing has resolved  6. Tobacco abuse: she states that she stopped a year ago, but admitted to smoking at the last visit  6. 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 Hypertension - controlled  Follow up in 6 months  Lars MassonNELSON, Valene Villa H, MD, [facility] 09/09/2014, 9:15 AM

## 2014-12-19 ENCOUNTER — Emergency Department (HOSPITAL_COMMUNITY)
Admission: EM | Admit: 2014-12-19 | Discharge: 2014-12-19 | Disposition: A | Discharge status: Home / Self Care | Payer: Medicare Other | Attending: Emergency Medicine | Admitting: Emergency Medicine

## 2014-12-19 ENCOUNTER — Emergency Department (HOSPITAL_COMMUNITY): Payer: Medicare Other

## 2014-12-19 ENCOUNTER — Encounter (HOSPITAL_COMMUNITY): Payer: Self-pay | Admitting: *Deleted

## 2014-12-19 DIAGNOSIS — J449 Chronic obstructive pulmonary disease, unspecified: Secondary | ICD-10-CM | POA: Insufficient documentation

## 2014-12-19 DIAGNOSIS — R52 Pain, unspecified: Secondary | ICD-10-CM

## 2014-12-19 DIAGNOSIS — E780 Pure hypercholesterolemia: Secondary | ICD-10-CM | POA: Diagnosis not present

## 2014-12-19 DIAGNOSIS — N63 Unspecified lump in breast: Secondary | ICD-10-CM | POA: Insufficient documentation

## 2014-12-19 DIAGNOSIS — M79621 Pain in right upper arm: Secondary | ICD-10-CM | POA: Insufficient documentation

## 2014-12-19 DIAGNOSIS — Z8673 Personal history of transient ischemic attack (TIA), and cerebral infarction without residual deficits: Secondary | ICD-10-CM | POA: Diagnosis not present

## 2014-12-19 DIAGNOSIS — Z79899 Other long term (current) drug therapy: Secondary | ICD-10-CM | POA: Diagnosis not present

## 2014-12-19 DIAGNOSIS — Z7951 Long term (current) use of inhaled steroids: Secondary | ICD-10-CM | POA: Insufficient documentation

## 2014-12-19 DIAGNOSIS — M25511 Pain in right shoulder: Secondary | ICD-10-CM | POA: Diagnosis present

## 2014-12-19 DIAGNOSIS — Z7982 Long term (current) use of aspirin: Secondary | ICD-10-CM | POA: Insufficient documentation

## 2014-12-19 DIAGNOSIS — M8700 Idiopathic aseptic necrosis of unspecified bone: Secondary | ICD-10-CM

## 2014-12-19 DIAGNOSIS — Z87891 Personal history of nicotine dependence: Secondary | ICD-10-CM | POA: Diagnosis not present

## 2014-12-19 DIAGNOSIS — M879 Osteonecrosis, unspecified: Secondary | ICD-10-CM | POA: Insufficient documentation

## 2014-12-19 DIAGNOSIS — Z87448 Personal history of other diseases of urinary system: Secondary | ICD-10-CM | POA: Diagnosis not present

## 2014-12-19 DIAGNOSIS — I10 Essential (primary) hypertension: Secondary | ICD-10-CM | POA: Insufficient documentation

## 2014-12-19 DIAGNOSIS — I252 Old myocardial infarction: Secondary | ICD-10-CM | POA: Insufficient documentation

## 2014-12-19 DIAGNOSIS — K219 Gastro-esophageal reflux disease without esophagitis: Secondary | ICD-10-CM | POA: Diagnosis not present

## 2014-12-19 MED ORDER — OXYCODONE-ACETAMINOPHEN 5-325 MG PO TABS: 1.0000 | ORAL_TABLET | Freq: Once | ORAL | Status: DC

## 2014-12-19 MED ORDER — OXYCODONE-ACETAMINOPHEN 5-325 MG PO TABS
1.0000 | ORAL_TABLET | Freq: Once | ORAL | Status: AC
  Administered 2014-12-19: 1 via ORAL
  Filled 2014-12-19: qty 1

## 2014-12-19 NOTE — ED Notes (Addendum)
PT reports RT shoulder pain started 2 years ago and is getting worse. PT reports when her RT arm hurts it causes her ABD. To hurt.

## 2014-12-19 NOTE — ED Notes (Signed)
MRI called for update; pt to go within the hour

## 2014-12-19 NOTE — ED Notes (Signed)
I gave the patient a cup of ice and a coke.

## 2014-12-19 NOTE — ED Provider Notes (Signed)
CSN: [id number]     Arrival date & time 12/19/14  0935 History   First MD Initiated Contact with Patient 12/19/14 [phone]     Chief Complaint  Patient presents with  . Shoulder Pain  . Arm Pain      HPI Patient presents with several month history of worsening shoulder pain.  Pain is exacerbated by any movement at all especially abduction.  Patient has not seen an orthopedic doctor about it.  Patient's also had some bloody discharge from her right breast along with a small lump.  She said something to her primary care doctor who told her not to worry about it.  Patient denies fever chills or recent trauma.  Patient has no history of known breast cancer. Past Medical History  Diagnosis Date  . Stroke   . Hypertension   . MI (myocardial infarction)   . History of blood clots   . Renal disorder   . High cholesterol   . COPD (chronic obstructive pulmonary disease)   . Shortness of breath   . Asthma   . GERD (gastroesophageal reflux disease)    Past Surgical History  Procedure Laterality Date  . Abdominal hysterectomy     Family History  Problem Relation Age of Onset  . Heart attack Father     in his 63s  . Heart failure Mother     died of CHF 10 yrs ago  . Congestive Heart Failure Mother    History  Substance Use Topics  . Smoking status: Former Smoker    Quit date: 02/01/2013  . Smokeless tobacco: Not on file     Comment: started at age 49, 1-2ppd  . Alcohol Use: No   OB History    No data available     Review of Systems  All other systems reviewed and are negative  Allergies  Ciprofloxacin  Home Medications   Prior to Admission medications   Medication Sig Start Date End Date Taking? Authorizing Provider  albuterol (PROVENTIL) (2.5 MG/3ML) 0.083% nebulizer solution Take 3 mLs (2.5 mg total) by nebulization every 4 (four) hours as needed for wheezing or shortness of breath. 07/13/14   Esperanza Sheets, MD  amLODipine (NORVASC) 10 MG tablet Take 1 tablet (10 mg  total) by mouth daily. 07/13/14   Esperanza Sheets, MD  aspirin EC 81 MG EC tablet Take 1 tablet (81 mg total) by mouth daily. 07/13/14   Esperanza Sheets, MD  atorvastatin (LIPITOR) 40 MG tablet Take 1 tablet (40 mg total) by mouth at bedtime. 07/13/14   Esperanza Sheets, MD  beclomethasone (QVAR) 80 MCG/ACT inhaler Inhale 2 puffs into the lungs 1 day or 1 dose.    Historical Provider, MD  calcitRIOL (ROCALTROL) 0.25 MCG capsule Take 1 capsule by mouth daily. 07/30/14   Historical Provider, MD  cloNIDine (CATAPRES) 0.1 MG tablet Take 1 tablet (0.1 mg total) by mouth every 4 (four) hours as needed (SBP > 170 or DBP >110). 07/13/14   Esperanza Sheets, MD  cyproheptadine (PERIACTIN) 4 MG tablet Take 1 tablet by mouth 2 (two) times daily as needed.  05/24/14   Historical Provider, MD  doxazosin (CARDURA) 4 MG tablet Take 1 tablet by mouth daily. 06/28/14   Historical Provider, MD  escitalopram (LEXAPRO) 10 MG tablet Take 1 tablet by mouth daily. 07/29/14   Historical Provider, MD  febuxostat (ULORIC) 40 MG tablet Take 40 mg by mouth as needed.    Historical Provider, MD  fluticasone (CUTIVATE)  0.05 % cream as directed. 05/12/14   Historical Provider, MD  Fluticasone-Salmeterol (ADVAIR) 250-50 MCG/DOSE AEPB Inhale 1 puff into the lungs every 12 (twelve) hours. 07/13/14   Esperanza Sheets, MD  furosemide (LASIX) 80 MG tablet Take 40-80 mg by mouth 2 (two) times daily. Take 1 tablet ( ) in the morning and 1/2 tablet ( ) in the evening 07/13/14   Esperanza Sheets, MD  guaiFENesin (MUCINEX) 600 MG 12 hr tablet Take 1,200 mg by mouth every 12 (twelve) hours as needed for congestion.    Historical Provider, MD  HYDROcodone-acetaminophen (NORCO/VICODIN) 5-325 MG per tablet Take 1 tablet by mouth every 6 (six) hours as needed for moderate pain. 07/13/14   Esperanza Sheets, MD  hydrOXYzine (ATARAX/VISTARIL) 25 MG tablet Take 1 tablet by mouth as needed. 05/12/14   Historical Provider, MD  ipratropium (ATROVENT) 0.02 %  nebulizer solution Take 2.5 mLs (500 mcg total) by nebulization every 6 (six) hours as needed for wheezing ((with Xopenex)). 07/13/14   Esperanza Sheets, MD  isosorbide dinitrate (ISORDIL) 30 MG tablet Take 30 mg by mouth daily.    Historical Provider, MD  labetalol (NORMODYNE) 300 MG tablet Take 300 mg by mouth 2 (two) times daily.  08/25/14   Historical Provider, MD  magnesium gluconate (MAGONATE) 500 MG tablet Take 500 mg by mouth daily.    Historical Provider, MD  mometasone-formoterol (DULERA) 200-5 MCG/ACT AERO Inhale 2 puffs into the lungs as needed for wheezing.    Historical Provider, MD  montelukast (SINGULAIR) 10 MG tablet Take 1 tablet by mouth daily. 07/26/14   Historical Provider, MD  nebivolol (BYSTOLIC) 10 MG tablet Take 10 mg by mouth daily. Take 1 1/2 daily 08/04/14   Lars Masson, MD  nitroGLYCERIN (NITROSTAT) 0.4 MG SL tablet Place 0.4 mg under the tongue every 5 (five) minutes as needed for chest pain.    Historical Provider, MD  omeprazole (PRILOSEC) 20 MG capsule Take 20 mg by mouth daily.    Historical Provider, MD  oxyCODONE-acetaminophen (PERCOCET/ROXICET) 5-325 MG per tablet Take 1 tablet by mouth once. 12/19/14   Nelia Shi, MD  traMADol (ULTRAM) 50 MG tablet Take 50 mg by mouth 3 (three) times daily as needed.    Historical Provider, MD  traZODone (DESYREL) 50 MG tablet Take 50 mg by mouth at bedtime as needed for sleep.    Historical Provider, MD  tretinoin (RETIN-A) 0.1 % cream Apply 1 application topically as directed.    Historical Provider, MD   BP 148/59 mmHg  Pulse 69  Temp(Src) 98 F (36.7 C) (Oral)  Resp 18  Ht  (1.6 m)  Wt 199 lb (90.266 kg)  BMI 35.26 kg/m2  SpO2 97% Physical Exam  Constitutional: She is oriented to person, place, and time. She appears well-developed and well-nourished. No distress.  HENT:  Head: Normocephalic and atraumatic.  Eyes: Pupils are equal, round, and reactive to light.  Neck: Normal range of motion.   Cardiovascular: Normal rate and intact distal pulses.   Pulmonary/Chest: No respiratory distress.  Abdominal: Normal appearance. She exhibits no distension.  Musculoskeletal:       Right shoulder: She exhibits decreased range of motion, tenderness and pain. She exhibits no swelling, no effusion, no crepitus, no deformity and normal pulse.  Neurological: She is alert and oriented to person, place, and time. No cranial nerve deficit.  Skin: Skin is warm and dry. No rash noted.  Psychiatric: She has a normal mood and affect. Her  behavior is normal.  Nursing note and vitals reviewed.   ED Course  Procedures (including critical care time) Labs Review Labs Reviewed - No data to display  Imaging Review Dg Shoulder Right  12/19/2014   CLINICAL DATA:  Right shoulder pain worsening over the past few years. Marked today. No known injury.  EXAM: RIGHT SHOULDER - 2+ VIEW  COMPARISON:  None.  FINDINGS: Mild sclerosis and lucency in the humeral head. Normal-appearing joint spaces.  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 A neoplastic process is less likely. This could be better defined with a noncontrast shoulder MR.   Electronically Signed   By: Gordan Payment M.D.   On: 12/19/2014 11:22   Mr Shoulder Right Wo Contrast  12/19/2014   CLINICAL DATA:  Chronic right shoulder pain which has become severe. Low grade fever.  EXAM: MRI OF THE RIGHT SHOULDER WITHOUT CONTRAST  TECHNIQUE: Multiplanar, multisequence MR imaging of the shoulder was performed. No intravenous contrast was administered.  COMPARISON:  Plain films of the right shoulder today and 09/24/2012.  FINDINGS: Rotator cuff:  There is rotator cuff tendinopathy without tear.  Muscles:  No focal atrophy or lesion.  Biceps long head: The entire extent of fluid is not visualized but it extends at least 10 cm below the top of the humeral head and includes the entire area imaged on the study. The tendon is intact.  Acromioclavicular Joint:  Moderate to moderately severe degenerative change is present.  Glenohumeral Joint: There is is small to moderate glenohumeral joint effusion. Fluid is also seen in the subscapularis recess where loose bodies measuring up to 1 cm in diameter are identified.  Labrum:  Appears intact.  Bones: Abnormal signal throughout the visualized humerus. At the articular surface of the humeral head, there is sclerosis and fluid undermining cortical bone with an appearance most consistent with avascular necrosis. Cyst formation is seen in the humeral head and proximal diaphysis with a fluid-fluid level seen in the humeral head. Areas of abnormal signal extending into the humeral diaphysis have a bone within a bone appearance in portions. No fracture is identified.  IMPRESSION: Avascular necrosis of the right humeral head. Abnormal signal throughout the visualized humeral diaphysis is nonspecific but the appearance is most suggestive of the formation of intraosseous ganglia and medullary bone infarct. Marrow infiltrative process is possible but thought less likely. Osteomyelitis is highly unlikely.  Small to moderate glenohumeral joint effusion with fluid also seen in the subscapularis recess where loose bodies are identified is likely related to degenerative disease. Given history of fever, septic joint is possible.  Acromioclavicular osteoarthritis.   Electronically Signed   By: Drusilla Kanner M.D.   On: 12/19/2014 14:58    I discussed the findings with orthopedics.  Follow-up was arranged early next week.  Patient be treated with a sling and pain medicine.  MDM   Final diagnoses:  Pain  AVN (avascular necrosis of bone)        Nelia Shi, MD 12/20/14 [phone]

## 2014-12-19 NOTE — ED Notes (Signed)
Patient transported to MRI 

## 2014-12-19 NOTE — ED Notes (Signed)
Patient transported to X-ray without distress.  

## 2014-12-19 NOTE — ED Provider Notes (Signed)
Patient presents with progressive worsening right shoulder pain with limited range of motion. She has decreased range of motion on exam which may suggest adhesive capsulitis. However she has a significant cardiac history, prior PE, and temporary dialysis patient and currently complaining of some nausea and abdominal pain. Given her age and her comorbidities, patient may need further evaluation to ensure no cardiac etiology causing her symptoms. Patient was moved to the main side for further evaluation.  BP 141/65 mmHg  Pulse 71  Temp(Src) 98 F (36.7 C) (Oral)  Resp 18  Ht 5\' 3"  (1.6 m)  Wt 199 lb (90.266 kg)  BMI 35.26 kg/m2  SpO2 100%      Fayrene Helper, PA-C 12/19/14 1008  Candyce Churn III, MD 12/19/14 [phone]

## 2014-12-19 NOTE — Discharge Instructions (Signed)
Avascular Necrosis Avascular necrosis is a disease resulting from the temporary or permanent loss of the blood supply to the bones. Without blood, the bone tissue dies and causes the bone to become soft. If the process involves the bone near a joint, it may lead to collapse of the joint surface. This disease is also known as: 1. Osteonecrosis. 2. Aseptic necrosis. 3. Ischemic bone necrosis. Avascular necrosis most commonly affects the ends (epiphysis) of long bones. The femur, the bone extending from the knee joint to the hip joint, is the bone most commonly involved. The disease may affect 1 bone, more than 1 bone at the same time, more than 1 bone at different times. It affects men and women equally. Avascular necrosis occurs at any age. But it is more common between the ages of 37 and 50 years. SYMPTOMS  In early stages patients may not have any symptoms. But as the disease progresses, joint pain generally develops. At first there is pain when putting weight on the affected joint, and then when resting. Pain usually develops gradually. It may be mild or severe. As the disease progresses and the bone and surrounding joint surface collapses, pain may develop or increase dramatically. Pain may be severe enough to limit range of motion in the affected joint. The period of time between the first symptoms and loss of joint function is different for each patient. This can range from several months to more than a year. Disability depends on:  What part of the bone is affected.  How large an area is involved.  How effectively the bone repairs itself.  If other illnesses are present.  If you are being treated for cancer with medications (chemotherapy).  Radiation.  The cause of the avascular necrosis. DIAGNOSIS  The diagnosis of aseptic necrosis is usually made by:  Taking a history.  Doing an exam.  Taking X-rays. (If X-rays are normal, an MRI may be required.)  Sometimes further blood work  and specialized studies may be necessary. TREATMENT  Treatment for this disease is necessary to maintain joint function. If untreated, most patients will suffer severe pain and limitation in movement within 2 years. Several treatments are available that help prevent further bone and joint damage. They can also reduce pain. To determine the most appropriate treatment, the caregiver considers the following aspects of a patient's disease:  The age of the patient.  The stage of the disease (early or late).  The location and amount of bone affected. It may be a small or large area.  The underlying cause of avascular necrosis. The goals in treatment are to:  Improve the patient's use of the affected joint.  Stop further damage to the bone.  Improve bone and joint survival. Your caregiver may use one or more of the following treatments:  Reduced weight bearing. If avascular necrosis is diagnosed early, the caregiver may begin treatment by having the patient limit weight on the affected joint. The caregiver may recommend limiting activities or using crutches. In some cases, reduced weight bearing can slow the damage caused by the disease and permit natural healing. When combined with medication to reduce pain, reduced weight bearing can be an effective way to avoid or delay surgery for some patients. Most patients eventually will need surgery to reconstruct the joint.  Core decompression. Core decompression works best in people who are in the earliest stages of avascular necrosis, before the collapse of the joint. This procedure often can reduce pain and slow the progression  of bone and joint destruction in these patients. This surgical procedure removes the inner layer of bone, which:  Reduces pressure within the bone.  Increases blood flow to the bone.  Allows more blood vessels to form.  Reduces pain.  Osteotomy. This surgical procedure re-shapes the bone to reduce stress on the affected  area of the joint. There is a lengthy recovery period. The patient's activities are very limited for 3 to 12 months after an osteotomy. This procedure is most effective for younger patients with advanced avascular necrosis, and those with a large area of affected bone.  Bone Graft. A bone graft may be used to support a joint after core decompression. Bone grafting is surgery that transplants healthy bone from one part of the patient, such as the leg, to the diseased area. Sometimes the bone is taken with it's blood vessels which are attached to local blood vessels near the area of bone collapse. This is called a vascularized bone graft. There is a lengthy recovery period after a bone graft, usually from 6 to 12 months. This procedure is technically complex.  Arthroplasty. Arthroplasty is also known as total joint replacement. Total joint replacement is used in late-stage avascular necrosis, and when the joint is deformed. In this surgery, the diseased joint is replaced with artificial parts. It may be recommended for people who are not good candidates for other treatments, such as patients who may not do well with repeated attempts to preserve the joint. Various types of replacements are available, and patients should discuss specific needs with their caregiver. New treatments being tried include:  The use of medications.  Electrical stimulation.  Combination therapies to increase the growth of new bone and blood vessels. Document Released: 05/12/2002 Document Revised: 02/12/2012 Document Reviewed: 01/28/2014 [facility] Patient Information 2015 [place], [place]. This information is not intended to replace advice given to you by your health care provider. Make sure you discuss any questions you have with your health care provider. Breast Self-Awareness Practicing breast self-awareness may pick up problems early, prevent significant medical complications, and possibly save your life. By practicing breast  self-awareness, you can become familiar with how your breasts look and feel and if your breasts are changing. This allows you to notice changes early. It can also offer you some reassurance that your breast health is good. One way to learn what is normal for your breasts and whether your breasts are changing is to do a breast self-exam. If you find a lump or something that was not present in the past, it is best to contact your caregiver right away. Other findings that should be evaluated by your caregiver include nipple discharge, especially if it is bloody; skin changes or reddening; areas where the skin seems to be pulled in (retracted); or new lumps and bumps. Breast pain is seldom associated with cancer (malignancy), but should also be evaluated by a caregiver. HOW TO PERFORM A BREAST SELF-EXAM The best time to examine your breasts is 5-7 days after your menstrual period is over. During menstruation, the breasts are lumpier, and it may be more difficult to pick up changes. If you do not menstruate, have reached menopause, or had your uterus removed (hysterectomy), you should examine your breasts at regular intervals, such as monthly. If you are breastfeeding, examine your breasts after a feeding or after using a breast pump. Breast implants do not decrease the risk for lumps or tumors, so continue to perform breast self-exams as recommended. Talk to your caregiver about  how to determine the difference between the implant and breast tissue. Also, talk about the amount of pressure you should use during the exam. Over time, you will become more familiar with the variations of your breasts and more comfortable with the exam. A breast self-exam requires you to remove all your clothes above the waist. 4. Look at your breasts and nipples. Stand in front of a mirror in a room with good lighting. With your hands on your hips, push your hands firmly downward. Look for a difference in shape, contour, and size from one  breast to the other (asymmetry). Asymmetry includes puckers, dips, or bumps. Also, look for skin changes, such as reddened or scaly areas on the breasts. Look for nipple changes, such as discharge, dimpling, repositioning, or redness. 5. Carefully feel your breasts. This is best done either in the shower or tub while using soapy water or when flat on your back. Place the arm (on the side of the breast you are examining) above your head. Use the pads (not the fingertips) of your three middle fingers on your opposite hand to feel your breasts. Start in the underarm area and use  inch (2 cm) overlapping circles to feel your breast. Use 3 different levels of pressure (light, medium, and firm pressure) at each circle before moving to the next circle. The light pressure is needed to feel the tissue closest to the skin. The medium pressure will help to feel breast tissue a little deeper, while the firm pressure is needed to feel the tissue close to the ribs. Continue the overlapping circles, moving downward over the breast until you feel your ribs below your breast. Then, move one finger-width towards the center of the body. Continue to use the  inch (2 cm) overlapping circles to feel your breast as you move slowly up toward the collar bone (clavicle) near the base of the neck. Continue the up and down exam using all 3 pressures until you reach the middle of the chest. Do this with each breast, carefully feeling for lumps or changes. 6.  Keep a written record with breast changes or normal findings for each breast. By writing this information down, you do not need to depend only on memory for size, tenderness, or location. Write down where you are in your menstrual cycle, if you are still menstruating. Breast tissue can have some lumps or thick tissue. However, see your caregiver if you find anything that concerns you.  SEEK MEDICAL CARE IF:  You see a change in shape, contour, or size of your breasts or nipples.    You see skin changes, such as reddened or scaly areas on the breasts or nipples.   You have an unusual discharge from your nipples.   You feel a new lump or unusually thick areas.  Document Released: 11/20/2005 Document Revised: 11/06/2012 Document Reviewed: 03/06/2012 [facility] Patient Information 2015 [place], [place]. This information is not intended to replace advice given to you by your health care provider. Make sure you discuss any questions you have with your health care provider.

## 2014-12-19 NOTE — ED Notes (Signed)
Pt remains in MRI 

## 2014-12-19 NOTE — ED Notes (Signed)
Coffee and [place] sandwich given.

## 2014-12-19 NOTE — ED Notes (Signed)
Pt remains in xray.

## 2014-12-19 NOTE — ED Notes (Signed)
Patient returned from X-ray 

## 2015-01-06 ENCOUNTER — Other Ambulatory Visit (HOSPITAL_COMMUNITY): Payer: Self-pay | Admitting: Orthopedic Surgery

## 2015-01-06 DIAGNOSIS — M87022 Idiopathic aseptic necrosis of left humerus: Secondary | ICD-10-CM

## 2015-03-08 ENCOUNTER — Encounter: Payer: Self-pay | Admitting: Cardiology

## 2015-03-08 ENCOUNTER — Ambulatory Visit (INDEPENDENT_AMBULATORY_CARE_PROVIDER_SITE_OTHER): Payer: Medicare Other | Admitting: Cardiology

## 2015-03-08 VITALS — BP 140/80 | HR 66 | Ht 63.0 in | Wt 204.0 lb

## 2015-03-08 DIAGNOSIS — I5033 Acute on chronic diastolic (congestive) heart failure: Secondary | ICD-10-CM

## 2015-03-08 DIAGNOSIS — I5030 Unspecified diastolic (congestive) heart failure: Secondary | ICD-10-CM

## 2015-03-08 DIAGNOSIS — I214 Non-ST elevation (NSTEMI) myocardial infarction: Secondary | ICD-10-CM

## 2015-03-08 DIAGNOSIS — Z720 Tobacco use: Secondary | ICD-10-CM | POA: Diagnosis not present

## 2015-03-08 DIAGNOSIS — F17200 Nicotine dependence, unspecified, uncomplicated: Secondary | ICD-10-CM

## 2015-03-08 LAB — COMPREHENSIVE METABOLIC PANEL
ALT: 10 U/L (ref 0–35)
AST: 14 U/L (ref 0–37)
Albumin: 3.8 g/dL (ref 3.5–5.2)
Alkaline Phosphatase: 102 U/L (ref 39–117)
BUN: 20 mg/dL (ref 6–23)
CO2: 34 mEq/L — ABNORMAL HIGH (ref 19–32)
Calcium: 9.5 mg/dL (ref 8.4–10.5)
Chloride: 99 mEq/L (ref 96–112)
Creatinine, Ser: 1.71 mg/dL — ABNORMAL HIGH (ref 0.40–1.20)
GFR: 31.17 mL/min — ABNORMAL LOW (ref >60.00)
Glucose, Bld: 124 mg/dL — ABNORMAL HIGH (ref 70–99)
Potassium: 3.2 mEq/L — ABNORMAL LOW (ref 3.5–5.1)
Sodium: 141 mEq/L (ref 135–145)
Total Bilirubin: 0.4 mg/dL (ref 0.2–1.2)
Total Protein: 6.4 g/dL (ref 6.0–8.3)

## 2015-03-08 MED ORDER — ESCITALOPRAM OXALATE 20 MG PO TABS
20.0000 mg | ORAL_TABLET | Freq: Every day | ORAL | Status: DC
Start: 2015-03-08 — End: 2016-02-09

## 2015-03-08 NOTE — Progress Notes (Signed)
Patient ID: Nur Rabold, female   DOB: 04-21-1941, 74 y.o.   MRN: [id number]    Patient Name: [name] [name] Date of Encounter: 03/08/2015  Primary Care Provider:  Konrad Felix, MD Primary Cardiologist:  Lars Masson  Problem List   Past Medical History  Diagnosis Date  . Stroke   . Hypertension   . MI (myocardial infarction)   . History of blood clots   . Renal disorder   . High cholesterol   . COPD (chronic obstructive pulmonary disease)   . Shortness of breath   . Asthma   . GERD (gastroesophageal reflux disease)    Past Surgical History  Procedure Laterality Date  . Abdominal hysterectomy     Allergies  Allergies  Allergen Reactions  . 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 Despite stopping Lasix and ACE I her renal function progressively worsened to a peak of ~7 (baseline of 1.2-2.5), emergent HD was initiated. Cardiac enzymes were elevated in the absence of EKG changes.  ECHO was WNL except for grade 1 diastolic dysfunction. IV Heparin was started. She stabilized and was able to transfer out to SDU. NSTEMI; chest pain had resolved, cont Imdur,ASA and statin;added BB. -8/8: stress test: No evidence for pharmacological induced ischemia. Calculated ejection fraction is 64%  Acute on chronic diastolic CHF  -Echocardiogram WNL except for grade 1 diastolic dysfunction; cont diuresis; stable.  Post - hospitalization follow up - 08/04/2014  08/04/2014 - the patient is complaining of feeling tired, weak, insomnia. No chest pain or resting SOB, positive DOE. Her lasix was recently increased to 80 mg po daily for LE edema and DOE, with some improvement in symptoms. She continues to smoke.   09/09/2014 - the  patient feels exhausted all the time, no chest pain, no resting SOB, stable exertional SOB. She has been treated for anemia with improvement of Hb levels, however no improvement in symptoms. She is refusing to go to cardiac rehab. No orthopnea, PND or LE edema. Lasix was changed from 80 mg PO BID to 80 mg in the AM and 40 mg in the PM by her nephrologist. No K as she had hyperkalemia on the lab from 2 weeks ago (not in our system). She brings BP diary and controlled most of the time.   03/08/15 - the patient is coming after 6 months, appears down, she states that she has no energy and will to do anything, she continues to smoke and refused to go to the cardiac rehab as she just feels its too much for her right now. She is complaining of LE tingling and RU shoulder pain, she has seen orthopedic surgeon and had MRI done, results are unclear.  She denies CP, has stable DOE, she complains of night sweats, no orthopnea or PND.   Home Medications  Prior to Admission medications   Medication Sig Start Date End Date Taking? Authorizing Provider  albuterol (PROVENTIL) (2.5 MG/3ML) 0.083% nebulizer solution Take 3 mLs (2.5 mg total) by nebulization every 4 (four) hours as needed for wheezing or shortness of breath. 07/13/14  Yes Esperanza Sheets, MD  amLODipine (NORVASC) 10 MG tablet Take 1 tablet (10 mg total) by mouth daily. 07/13/14  Yes Esperanza Sheets, MD  aspirin EC 81 MG EC tablet Take 1 tablet (81 mg total) by mouth daily. 07/13/14  Yes Esperanza Sheets, MD  atorvastatin (LIPITOR) 40 MG tablet Take 1 tablet (40 mg total) by mouth at bedtime. 07/13/14  Yes Esperanza Sheets, MD  calcitRIOL (ROCALTROL) 0.25 MCG capsule Take 1 capsule by mouth daily. 07/30/14  Yes Historical Provider, MD  Calcium-Magnesium-Vitamin D (CALCIUM 500 PO) Take 500 mg by mouth daily.   Yes Historical Provider, MD  cloNIDine (CATAPRES) 0.1 MG tablet Take 1 tablet (0.1 mg total) by mouth every 4 (four) hours as needed (SBP > 170 or DBP  >110). 07/13/14  Yes Esperanza Sheets, MD  cyproheptadine (PERIACTIN) 4 MG tablet Take 1 tablet by mouth daily. 05/24/14  Yes Historical Provider, MD  doxazosin (CARDURA) 4 MG tablet Take 1 tablet by mouth daily. 06/28/14  Yes Historical Provider, MD  escitalopram (LEXAPRO) 10 MG tablet Take 1 tablet by mouth daily. 07/29/14  Yes Historical Provider, MD  fluticasone (CUTIVATE) 0.05 % cream as directed. 05/12/14  Yes Historical Provider, MD  Fluticasone-Salmeterol (ADVAIR) 250-50 MCG/DOSE AEPB Inhale 1 puff into the lungs every 12 (twelve) hours. 07/13/14  Yes Esperanza Sheets, MD  furosemide (LASIX) 80 MG tablet Take 1 tablet (80 mg total) by mouth daily. 07/13/14  Yes Esperanza Sheets, MD  guaiFENesin (MUCINEX) 600 MG 12 hr tablet Take 1,200 mg by mouth every 12 (twelve) hours as needed for congestion.   Yes Historical Provider, MD  hydrALAZINE (APRESOLINE) 50 MG tablet Take 1 tablet by mouth daily. 06/07/14  Yes Historical Provider, MD  HYDROcodone-acetaminophen (NORCO/VICODIN) 5-325 MG per tablet Take 1 tablet by mouth every 6 (six) hours as needed for moderate pain. 07/13/14  Yes Esperanza Sheets, MD  hydrOXYzine (ATARAX/VISTARIL) 25 MG tablet Take 1 tablet by mouth as needed. 05/12/14  Yes Historical Provider, MD  ipratropium (ATROVENT) 0.02 % nebulizer solution Take 2.5 mLs (500 mcg total) by nebulization every 6 (six) hours as needed for wheezing ((with Xopenex)). 07/13/14  Yes Esperanza Sheets, MD  isosorbide mononitrate (IMDUR) 60 MG 24 hr tablet Take 1 tablet (60 mg total) by mouth daily. 07/13/14  Yes Esperanza Sheets, MD  labetalol (NORMODYNE) 300 MG tablet Take 1 tablet by mouth daily. 07/21/14  Yes Historical Provider, MD  metoprolol (LOPRESSOR) 50 MG tablet Take 1 tablet (50 mg total) by mouth 2 (two) times daily. 07/13/14  Yes Esperanza Sheets, MD  montelukast (SINGULAIR) 10 MG tablet Take 1 tablet by mouth daily. 07/26/14  Yes Historical Provider, MD  nitroGLYCERIN (NITROSTAT) 0.4 MG SL tablet Place  0.4 mg under the tongue every 5 (five) minutes as needed for chest pain.   Yes Historical Provider, MD  omeprazole (PRILOSEC) 20 MG capsule Take 20 mg by mouth daily.   Yes Historical Provider, MD  potassium chloride (K-DUR) 10 MEQ tablet Take 1 tablet (10 mEq total) by mouth daily. 07/13/14  Yes Esperanza Sheets, MD  predniSONE (DELTASONE) 10 MG tablet Take 1 tablet (10 mg total) by mouth daily with breakfast. 07/13/14  Yes Esperanza Sheets, MD  tretinoin (RETIN-A) 0.1 % cream Apply 1 application topically as directed.   Yes Historical Provider, MD    Family History  Family History  Problem Relation Age of Onset  . Heart attack Father     in his 71s  . Heart failure Mother     died of CHF 10 yrs ago  . Congestive Heart Failure Mother  Social History  History   Social History  . Marital Status: Divorced    Spouse Name: N/A  . Number of Children: N/A  . Years of Education: N/A   Occupational History  . Not on file.   Social History Main Topics  . Smoking status: Former Smoker    Quit date: 02/01/2013  . Smokeless tobacco: Not on file     Comment: started at age 62, 1-2ppd  . Alcohol Use: No  . Drug Use: No  . Sexual Activity: No   Other Topics Concern  . Not on file   Social History Narrative     Review of Systems, as per HPI, otherwise negative General:  No chills, fever, night sweats or weight changes.  Cardiovascular:  No chest pain, dyspnea on exertion, edema, orthopnea, palpitations, paroxysmal nocturnal dyspnea. Dermatological: No rash, lesions/masses Respiratory: No cough, dyspnea Urologic: No hematuria, dysuria Abdominal:   No nausea, vomiting, diarrhea, bright red blood per rectum, melena, or hematemesis Neurologic:  No visual changes, wkns, changes in mental status. All other systems reviewed and are otherwise negative except as noted above.  Physical Exam  There were no vitals taken for this visit.  General: Pleasant, NAD Psych: Normal  affect. Neuro: Alert and oriented X 3. Moves all extremities spontaneously. HEENT: Normal  Neck: Supple without bruits or JVD. Lungs:  Resp regular and unlabored, end-expiratory wheezing. Heart: RRR no s3, s4, or murmurs. Abdomen: Soft, non-tender, non-distended, BS + x 4.  Extremities: No clubbing, cyanosis, no edema. DP/PT/Radials 2+ and equal bilaterally.  Labs:  No results for input(s): CKTOTAL, CKMB, TROPONINI in the last 72 hours. Lab Results  Component Value Date   WBC 14.0* 07/13/2014   HGB 8.3* 07/13/2014   HCT 25.1* 07/13/2014   MCV 90.3 07/13/2014   PLT 166 07/13/2014    No results found for: DDIMER Invalid input(s): POCBNP    Component Value Date/Time   NA 141 09/09/2014 0939   K 3.8 09/09/2014 0939   CL 100 09/09/2014 0939   CO2 30 09/09/2014 0939   GLUCOSE 118* 09/09/2014 0939   BUN 35* 09/09/2014 0939   CREATININE 2.6* 09/09/2014 0939   CALCIUM 9.2 09/09/2014 0939   PROT 7.5 09/09/2014 0939   ALBUMIN 3.8 09/09/2014 0939   AST 21 09/09/2014 0939   ALT 15 09/09/2014 0939   ALKPHOS 118* 09/09/2014 0939   BILITOT 0.5 09/09/2014 0939   GFRNONAA 13* 07/13/2014 0415   GFRAA 15* 07/13/2014 0415   Accessory Clinical Findings  Echocardiogram   - Left ventricle: The cavity size was normal. There was moderate concentric hypertrophy. Systolic function was normal. The estimated ejection fraction was in the range of 60% to 65%. Wall motion was normal; there were no regional wall motion abnormalities. Doppler parameters are consistent with abnormal left ventricular relaxation (grade 1 diastolic dysfunction). There was no evidence of elevated ventricular filling pressure by Doppler parameters. - Aortic valve: Trileaflet; normal thickness leaflets. Transvalvular velocity was within the normal range. There was no stenosis. There was no regurgitation. - Aortic root: The aortic root was normal in size. - Mitral valve: Structurally normal valve. There was  no regurgitation. - Right ventricle: Systolic function was normal. - Right atrium: The atrium was normal in size. - Tricuspid valve: There was no regurgitation. - Pulmonary arteries: Systolic pressure was within the normal range.  Impressions: - Moderate LVH with abnormal relaxation. Otherwise normal study.    Assessment & Plan  1. CAD - NSTEMI, negative stress test, normal  echocardiogram, preserved LVEF - Echo 07/07/2014 EF 60-65%, moderate LVH, grade 1 diastolic dysfunction  - she appears very deconditioned, I offered cardiac rehab again, she refused again even if it was free - we will continue atorvastatin, ASA, nebivolol - she refused to quit smoking and has no motivation as she states that thtas the only enjoyable thing right now - she appears very depressed, we will increase lexapro to 20 mg po daily  2. Fatigue - cortisol level and TSH were normal  3. Chronic diastolic CHF - no edema, weight 304,  previously 297, grade 1 DD, appears euvolemic, we will check CMP  4. Acute on chronic renal insufficiency - crea improved on 8/17 3.3 --> 2.3-->2.8, also followed by a nephrologist - since increased dose of Lasix we will check CMP today  5. COPD  - on Advair BID and albuterol PRN - I will stop labetalol and metoprolol to Bystolic 10 mg po daily, wheezing has resolved  6. Tobacco abuse: she states that she stopped a year ago, but admitted to smoking at the last visit  6. 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 Hypertension - rechecked controlled  Follow up in 6 months  Lars Masson, MD, [facility] 03/08/2015, 8:31 AM

## 2015-03-08 NOTE — Patient Instructions (Signed)
Your physician has recommended you make the following change in your medication:   INCREASE YOUR LEXAPRO TO 20 MG ONCE DAILY    Your physician recommends that you return for lab work in: TODAY---CMET    Your physician wants you to follow-up in: 6 MONTHS WITH DR Johnell Comings will receive a reminder letter in the mail two months in advance. If you don't receive a letter, please call our office to schedule the follow-up appointment.

## 2015-03-09 ENCOUNTER — Telehealth: Payer: Self-pay | Admitting: *Deleted

## 2015-03-09 MED ORDER — POTASSIUM CHLORIDE CRYS ER 20 MEQ PO TBCR: 20.0000 meq | EXTENDED_RELEASE_TABLET | Freq: Every day | ORAL | Status: DC

## 2015-03-09 NOTE — Telephone Encounter (Signed)
-----   Message from Lars Masson, MD sent at 03/08/2015 11:54 PM EDT ----- Improved Crea, low potassium, we need to increase K to 20 mEq daily.

## 2015-03-09 NOTE — Telephone Encounter (Signed)
Informed the pt of lab results and per Dr Delton See her creatinine improved, her potassium was low, and she recommends the pt increase her KCL to 20 mEq po daily.  Pt states she will take 2 of her 10 mEq potassium pills, but requesting new dose change will need to be sent into her pharmacy of choice.  Informed the pt when she picks up the new dose, she is only to take 1 pill to equal 20 mEq po daily.  Pt verbalized understanding and agrees with this plan.

## 2015-03-30 ENCOUNTER — Telehealth: Payer: Self-pay | Admitting: *Deleted

## 2015-03-30 ENCOUNTER — Telehealth: Payer: Self-pay | Admitting: Cardiology

## 2015-03-30 NOTE — Telephone Encounter (Signed)
Walk in pt form- Pt needs clearance faxed for Surgery-gave to [facility]

## 2015-03-30 NOTE — Telephone Encounter (Signed)
Walk-in pt form delivered to nurse in regards to the pt requesting clearance information to be sent to orthopedic doctor Dr Malon Kindle for the pt to receive surgery.  Called the pt back in regards to this and informed her that she should contact her Orthopedic Dr Ranell Patrick and inform his nurse to fax clearance information (if cardiac or medication clearance needed) to our office for Dr Delton See to review and advise on and fax back to Dr Ranell Patrick office.  Informed the pt of our fax and contact information to give Dr Norris's office.  Pt verbalized understanding and agrees with this plan.

## 2015-04-08 ENCOUNTER — Telehealth: Payer: Self-pay

## 2015-04-08 NOTE — Telephone Encounter (Signed)
Signed cardiac clearance place in MR nurse fax box to be faxed to [facility] fax # [phone]

## 2015-04-14 NOTE — Anesthesia Preprocedure Evaluation (Addendum)
Anesthesia Evaluation  Patient identified by MRN, date of birth, ID band Patient awake    Reviewed: Allergy & Precautions, NPO status , Patient's Chart, lab work & pertinent test results, reviewed documented beta blocker date and time   History of Anesthesia Complications (+) PONV  Airway Mallampati: III  TM Distance: >3 FB Neck ROM: Full    Dental  (+) Teeth Intact, Dental Advisory Given,    Pulmonary asthma , COPD COPD inhaler, Current Smoker, former smoker,    + wheezing      Cardiovascular hypertension, Pt. on medications and Pt. on home beta blockers + Past MI and +CHF Rhythm:Regular Rate:Normal  07/11/14 Nuclear stress test: IMPRESSION: No evidence for pharmacological induced ischemia. Calculated ejection fraction is 64%  07/07/14 Echo: - Left ventricle: The cavity size was normal. There was moderate concentric hypertrophy. Systolic function was normal. The estimated ejection fraction was in the range of 60% to 65%. Wall motion was normal; there were no regional wall motion abnormalities. Doppler parameters are consistent with abnormal left ventricular relaxation (grade 1 diastolic dysfunction).There was no evidence of elevated ventricular filling pressure by Doppler parameters. - Aortic valve: Trileaflet; normal thickness leaflets. Transvalvular velocity was within the normal range. There was no stenosis. There was no regurgitation. - Aortic root: The aortic root was normal in size. - Mitral valve: Structurally normal valve. There was no regurgitation. - Right ventricle: Systolic function was normal. - Right atrium: The atrium was normal in size. - Tricuspid valve: There was no regurgitation. - Pulmonary arteries: Systolic pressure was within the normal range. Impressions: Moderate LVH with abnormal relaxation. Otherwise normal study.    Neuro/Psych Anxiety Depression CVA, No Residual Symptoms    GI/Hepatic Neg liver ROS, PUD,  GERD-  ,  Endo/Other  Morbid obesity  Renal/GU Renal InsufficiencyRenal diseaseCr. 1.85     Musculoskeletal negative musculoskeletal ROS (+)   Abdominal   Peds  Hematology negative hematology ROS (+)   Anesthesia Other Findings   Reproductive/Obstetrics                          Anesthesia Physical Anesthesia Plan  ASA: III  Anesthesia Plan: General and Regional   Post-op Pain Management:    Induction: Intravenous  Airway Management Planned: Oral ETT  Additional Equipment:   Intra-op Plan:   Post-operative Plan: Extubation in OR and Possible Post-op intubation/ventilation  Informed Consent: I have reviewed the patients History and Physical, chart, labs and discussed the procedure including the risks, benefits and alternatives for the proposed anesthesia with the patient or authorized representative who has indicated his/her understanding and acceptance.   Dental advisory given  Plan Discussed with: CRNA and Surgeon  Anesthesia Plan Comments: ( )      Anesthesia Quick Evaluation

## 2015-04-14 NOTE — Progress Notes (Addendum)
Anesthesia Chart Review: Patient is a 74 year old female scheduled for right shoulder hemiarthroplasty on 04/16/15 by Dr. Ranell Patrick.   History includes smoking, COPD, SOB, asthma, NSTEMI 07/2014, acute on chronic diastolic CHF 07/2014, acute on chronic kidney disease s/p emergent HD for hyperkalemia 07/2014, murmur (no significant valvular disease on 2015 echo), HTN, CVA, GERD, hypercholesterolemia, RLE DVT/PE s/p IVC filter, bruises easily,  hysterectomy, post-operative N/V. BMI is consistent with obesity. OSA screening score was 4.  PCP is Dr. Konrad Felix with [facility]. Nephrologist is Dr. Briant Cedar. Cardiologist is Dr. Delton See.  Pulmonologist is Dr. Blenda Nicely. There is a note addressed to Dr. Maisie Fus, but appears to have Dr. Edd Arbour signature that states that "CKD is not a contraindication to surgery."  Dr. Delton See also signed a note of surgical clearance.  Dr. Blenda Nicely saw patient on 04/13/15 and felt patient was at "moderate risk for general surgery due to respiratory issues. I think that the patient should be given a nebulizer treatment an hour before and also after anesthesia. Patient should be on the lowest oxygen (post surgery) to keep the pulse ox between 90-92%. Patient may need BiPAP-CPAP after anesthesia as she likely has sleep apnea but has been refusing to have a sleep study..."  Meds include albuterol, allopurinol, amlodipine, ASA (not taking), Lipitor, Qvar, doxazosin, Lexapro, fluticasone, Lasix, Atrovent, Imdur, Magonate, Dulera, Singulair, nebivolol, Nitro, omeprazole, Percocet, KCl, spironolactone.  07/11/14 Nuclear stress test: IMPRESSION: No evidence for pharmacological induced ischemia. Calculated ejection fraction is 64%  07/07/14 Echo: - Left ventricle: The cavity size was normal. There was moderate concentric hypertrophy. Systolic function was normal. The estimated ejection fraction was in the range of 60% to 65%. Wall motion was normal; there were no regional  wall motion abnormalities. Doppler parameters are consistent with abnormal left ventricular relaxation (grade 1 diastolic dysfunction).There was no evidence of elevated ventricular filling pressure by Doppler parameters. - Aortic valve: Trileaflet; normal thickness leaflets. Transvalvular velocity was within the normal range. There was no stenosis. There was no regurgitation. - Aortic root: The aortic root was normal in size. - Mitral valve: Structurally normal valve. There was no regurgitation. - Right ventricle: Systolic function was normal. - Right atrium: The atrium was normal in size. - Tricuspid valve: There was no regurgitation. - Pulmonary arteries: Systolic pressure was within the normal range. Impressions: Moderate LVH with abnormal relaxation. Otherwise normal study.  07/05/14 EKG: NSR, non-specific ST/T wave abnormality (inferolateral leads).  1V CXR 07/05/14: Mild cardiomegaly which is stable from prior. Mild pulmonary hyperinflation. No edema. Patchy sclerosis in the right humeral head which may reflect AVN. IMPRESSION: New right IJ dialysis catheter without adverse feature.  Preoperative labs noted. BUN 22, Cr 1.85, stable. Glucose 125. WBC 12.5, H/H 13.2/39.5.  PT/PTT WNL.   She has cardiology, pulmonology, and nephrology clearance for this procedure.  If no acute change then I anticipate that she can proceed as planned.  Velna Ochs [facility] Short Stay Center/Anesthesiology Phone [phone] 04/15/2015 1:24 PM

## 2015-04-15 ENCOUNTER — Encounter (HOSPITAL_COMMUNITY)
Admission: RE | Admit: 2015-04-15 | Discharge: 2015-04-15 | Disposition: A | Discharge status: Home / Self Care | Payer: Medicare Other | Source: Ambulatory Visit | Attending: Orthopedic Surgery | Admitting: Orthopedic Surgery

## 2015-04-15 ENCOUNTER — Encounter (HOSPITAL_COMMUNITY): Payer: Self-pay

## 2015-04-15 DIAGNOSIS — J45909 Unspecified asthma, uncomplicated: Secondary | ICD-10-CM | POA: Diagnosis not present

## 2015-04-15 DIAGNOSIS — I509 Heart failure, unspecified: Secondary | ICD-10-CM | POA: Diagnosis not present

## 2015-04-15 DIAGNOSIS — M109 Gout, unspecified: Secondary | ICD-10-CM | POA: Diagnosis not present

## 2015-04-15 DIAGNOSIS — J449 Chronic obstructive pulmonary disease, unspecified: Secondary | ICD-10-CM | POA: Diagnosis not present

## 2015-04-15 DIAGNOSIS — Z881 Allergy status to other antibiotic agents status: Secondary | ICD-10-CM | POA: Diagnosis not present

## 2015-04-15 DIAGNOSIS — F329 Major depressive disorder, single episode, unspecified: Secondary | ICD-10-CM | POA: Diagnosis not present

## 2015-04-15 DIAGNOSIS — R21 Rash and other nonspecific skin eruption: Secondary | ICD-10-CM | POA: Diagnosis not present

## 2015-04-15 DIAGNOSIS — G43909 Migraine, unspecified, not intractable, without status migrainosus: Secondary | ICD-10-CM | POA: Diagnosis not present

## 2015-04-15 DIAGNOSIS — I10 Essential (primary) hypertension: Secondary | ICD-10-CM | POA: Diagnosis not present

## 2015-04-15 DIAGNOSIS — K219 Gastro-esophageal reflux disease without esophagitis: Secondary | ICD-10-CM | POA: Diagnosis not present

## 2015-04-15 DIAGNOSIS — M87821 Other osteonecrosis, right humerus: Secondary | ICD-10-CM | POA: Diagnosis not present

## 2015-04-15 DIAGNOSIS — E780 Pure hypercholesterolemia: Secondary | ICD-10-CM | POA: Diagnosis not present

## 2015-04-15 DIAGNOSIS — M879 Osteonecrosis, unspecified: Secondary | ICD-10-CM | POA: Diagnosis present

## 2015-04-15 DIAGNOSIS — Z90710 Acquired absence of both cervix and uterus: Secondary | ICD-10-CM | POA: Diagnosis not present

## 2015-04-15 DIAGNOSIS — F419 Anxiety disorder, unspecified: Secondary | ICD-10-CM | POA: Diagnosis not present

## 2015-04-15 DIAGNOSIS — I252 Old myocardial infarction: Secondary | ICD-10-CM | POA: Diagnosis not present

## 2015-04-15 DIAGNOSIS — Z8673 Personal history of transient ischemic attack (TIA), and cerebral infarction without residual deficits: Secondary | ICD-10-CM | POA: Diagnosis not present

## 2015-04-15 HISTORY — DX: Other skin changes: R23.8

## 2015-04-15 HISTORY — DX: Cardiac murmur, unspecified: R01.1

## 2015-04-15 HISTORY — DX: Other specified postprocedural states: Z98.890

## 2015-04-15 HISTORY — DX: Spontaneous ecchymoses: R23.3

## 2015-04-15 HISTORY — DX: Gastric ulcer, unspecified as acute or chronic, without hemorrhage or perforation: K25.9

## 2015-04-15 HISTORY — DX: Personal history of other infectious and parasitic diseases: Z86.19

## 2015-04-15 HISTORY — DX: Dysphagia, unspecified: R13.10

## 2015-04-15 HISTORY — DX: Major depressive disorder, single episode, unspecified: F32.9

## 2015-04-15 HISTORY — DX: Depression, unspecified: F32.A

## 2015-04-15 HISTORY — DX: Other specified postprocedural states: R11.2

## 2015-04-15 HISTORY — DX: Anxiety disorder, unspecified: F41.9

## 2015-04-15 HISTORY — DX: Rash and other nonspecific skin eruption: R21

## 2015-04-15 HISTORY — DX: Gout, unspecified: M10.9

## 2015-04-15 LAB — BASIC METABOLIC PANEL
Anion gap: 13 (ref 5–15)
BUN: 22 mg/dL — ABNORMAL HIGH (ref 6–20)
CALCIUM: 9.3 mg/dL (ref 8.9–10.3)
CO2: 32 mmol/L (ref 22–32)
CREATININE: 1.85 mg/dL — AB (ref 0.44–1.00)
Chloride: 97 mmol/L — ABNORMAL LOW (ref 101–111)
GFR calc Af Amer: 30 mL/min — ABNORMAL LOW (ref >60)
GFR, EST NON AFRICAN AMERICAN: 26 mL/min — AB (ref >60)
Glucose, Bld: 125 mg/dL — ABNORMAL HIGH (ref 65–99)
Potassium: 3.4 mmol/L — ABNORMAL LOW (ref 3.5–5.1)
SODIUM: 142 mmol/L (ref 135–145)

## 2015-04-15 LAB — CBC
HCT: 39.5 % (ref 36.0–46.0)
HEMOGLOBIN: 13.2 g/dL (ref 12.0–15.0)
MCH: 33.3 pg (ref 26.0–34.0)
MCHC: 33.4 g/dL (ref 30.0–36.0)
MCV: 99.7 fL (ref 78.0–100.0)
Platelets: 292 10*3/uL (ref 150–400)
RBC: 3.96 MIL/uL (ref 3.87–5.11)
RDW: 14 % (ref 11.5–15.5)
WBC: 12.5 10*3/uL — AB (ref 4.0–10.5)

## 2015-04-15 LAB — PROTIME-INR
INR: 1.02 (ref 0.00–1.49)
Prothrombin Time: 13.5 seconds (ref 11.6–15.2)

## 2015-04-15 LAB — APTT: aPTT: 24 seconds (ref 24–37)

## 2015-04-15 LAB — SURGICAL PCR SCREEN
MRSA, PCR: NEGATIVE
Staphylococcus aureus: NEGATIVE

## 2015-04-15 LAB — TYPE AND SCREEN
ABO/RH(D): O NEG
Antibody Screen: NEGATIVE

## 2015-04-15 NOTE — Pre-Procedure Instructions (Signed)
[name] [name]  04/15/2015   Your procedure is scheduled on:  Friday Apr 16, 2015 at 1:00 PM.  Report to [facility] Admitting at 11:00 AM.  Call this number if you have problems the morning of surgery: [phone]   Remember:   Do not eat food or drink liquids after midnight.   Take these medicines the morning of surgery with A SIP OF WATER: Albuterol nebulizer if needed, Amlodipine (Norvasc), Allopurinol (Zyloprim), Uloric,  Qvar inhaler, Escitalopram (Lexapro), Doxazosin (Cardura), Advair inhaler, Atrovent nebulizer if needed, Isosorbide (Imdur), Dulera inhaler if needed, Nebivolol (Bystolic), Omeprazole (Prilosec), Oxycodone (Percocet) if needed   Do not wear jewelry, make-up or nail polish.  Do not wear lotions, powders, or perfumes.  Do not shave 48 hours prior to surgery.   Do not bring valuables to the hospital.  [facility] is not responsible for any belongings or valuables.               Contacts, dentures or bridgework may not be worn into surgery.  Leave suitcase in the car. After surgery it may be brought to your room.  For patients admitted to the hospital, discharge time is determined by your treatment team.               Patients discharged the day of surgery will not be allowed to drive home.  Name and phone number of your driver:   Special Instructions: Shower using CHG soap the night before and the morning of your surgery   Please read over the following fact sheets that you were given: Pain Booklet, Coughing and Deep Breathing, Blood Transfusion Information, MRSA Information and Surgical Site Infection Prevention

## 2015-04-15 NOTE — Progress Notes (Signed)
   04/15/15 1005  OBSTRUCTIVE SLEEP APNEA  Have you ever been diagnosed with sleep apnea through a sleep study? No  Do you snore loudly (loud enough to be heard through closed doors)?  0  Do you often feel tired, fatigued, or sleepy during the daytime? 1  Has anyone observed you stop breathing during your sleep? 0  Do you have, or are you being treated for high blood pressure? 1  BMI more than 35 kg/m2? 1  Age over 72 years old? 1  Neck circumference greater than 40 cm/16 inches? 0  Gender: 0  Obstructive Sleep Apnea Score 4   This patient has screened at risk for sleep apnea using the STOP Bang tool used during a pre-surgical visit. A score of 4 or greater is at risk for sleep apnea.

## 2015-04-15 NOTE — Progress Notes (Signed)
Patient arrived to PAT with right arm in sling. PCP is Konrad Felix, Cardiologist is Tobias Alexander, Pulmonologist is Marcellus Scott, Nephrologist is Primitivo Gauze. Patient denied having any current cardiac or pulmonary issues. Patient informed Nurse that she last used Albuterol nebulizer 2 weeks ago.   Stress test results in EPIC. Patient denied having a cardiac cath or sleep study.

## 2015-04-16 ENCOUNTER — Ambulatory Visit (HOSPITAL_COMMUNITY): Payer: Medicare Other | Admitting: Vascular Surgery

## 2015-04-16 ENCOUNTER — Inpatient Hospital Stay (HOSPITAL_COMMUNITY): Payer: Medicare Other

## 2015-04-16 ENCOUNTER — Observation Stay (HOSPITAL_COMMUNITY)
Admission: RE | Admit: 2015-04-16 | Discharge: 2015-04-18 | Disposition: A | Discharge status: Home / Self Care | Payer: Medicare Other | Source: Ambulatory Visit | Attending: Orthopedic Surgery | Admitting: Orthopedic Surgery

## 2015-04-16 ENCOUNTER — Encounter (HOSPITAL_COMMUNITY): Payer: Self-pay

## 2015-04-16 ENCOUNTER — Encounter (HOSPITAL_COMMUNITY)
Admission: RE | Disposition: A | Discharge status: Home / Self Care | Payer: Self-pay | Source: Ambulatory Visit | Attending: Orthopedic Surgery

## 2015-04-16 ENCOUNTER — Ambulatory Visit (HOSPITAL_COMMUNITY): Payer: Medicare Other | Admitting: Anesthesiology

## 2015-04-16 DIAGNOSIS — M87821 Other osteonecrosis, right humerus: Secondary | ICD-10-CM | POA: Diagnosis not present

## 2015-04-16 DIAGNOSIS — Z90710 Acquired absence of both cervix and uterus: Secondary | ICD-10-CM | POA: Insufficient documentation

## 2015-04-16 DIAGNOSIS — R21 Rash and other nonspecific skin eruption: Secondary | ICD-10-CM | POA: Insufficient documentation

## 2015-04-16 DIAGNOSIS — I10 Essential (primary) hypertension: Secondary | ICD-10-CM | POA: Insufficient documentation

## 2015-04-16 DIAGNOSIS — E780 Pure hypercholesterolemia: Secondary | ICD-10-CM | POA: Diagnosis not present

## 2015-04-16 DIAGNOSIS — J449 Chronic obstructive pulmonary disease, unspecified: Secondary | ICD-10-CM | POA: Insufficient documentation

## 2015-04-16 DIAGNOSIS — Z96611 Presence of right artificial shoulder joint: Secondary | ICD-10-CM

## 2015-04-16 DIAGNOSIS — F419 Anxiety disorder, unspecified: Secondary | ICD-10-CM | POA: Insufficient documentation

## 2015-04-16 DIAGNOSIS — I252 Old myocardial infarction: Secondary | ICD-10-CM | POA: Insufficient documentation

## 2015-04-16 DIAGNOSIS — Z881 Allergy status to other antibiotic agents status: Secondary | ICD-10-CM | POA: Insufficient documentation

## 2015-04-16 DIAGNOSIS — G43909 Migraine, unspecified, not intractable, without status migrainosus: Secondary | ICD-10-CM | POA: Insufficient documentation

## 2015-04-16 DIAGNOSIS — I509 Heart failure, unspecified: Secondary | ICD-10-CM | POA: Insufficient documentation

## 2015-04-16 DIAGNOSIS — F329 Major depressive disorder, single episode, unspecified: Secondary | ICD-10-CM | POA: Insufficient documentation

## 2015-04-16 DIAGNOSIS — J45909 Unspecified asthma, uncomplicated: Secondary | ICD-10-CM | POA: Insufficient documentation

## 2015-04-16 DIAGNOSIS — K219 Gastro-esophageal reflux disease without esophagitis: Secondary | ICD-10-CM | POA: Insufficient documentation

## 2015-04-16 DIAGNOSIS — Z8673 Personal history of transient ischemic attack (TIA), and cerebral infarction without residual deficits: Secondary | ICD-10-CM | POA: Insufficient documentation

## 2015-04-16 DIAGNOSIS — M109 Gout, unspecified: Secondary | ICD-10-CM | POA: Insufficient documentation

## 2015-04-16 DIAGNOSIS — Z96619 Presence of unspecified artificial shoulder joint: Secondary | ICD-10-CM

## 2015-04-16 HISTORY — PX: SHOULDER HEMI-ARTHROPLASTY: SHX5049

## 2015-04-16 SURGERY — HEMIARTHROPLASTY, SHOULDER
Anesthesia: General | Site: Shoulder | Laterality: Right

## 2015-04-16 MED ORDER — MIDAZOLAM HCL 2 MG/2ML IJ SOLN
INTRAMUSCULAR | Status: AC
  Administered 2015-04-16: 1 mg
  Filled 2015-04-16: qty 2

## 2015-04-16 MED ORDER — OXYCODONE-ACETAMINOPHEN 5-325 MG PO TABS
1.0000 | ORAL_TABLET | ORAL | Status: DC | PRN
  Administered 2015-04-16 – 2015-04-18 (×7): 1 via ORAL
  Filled 2015-04-16 (×7): qty 1

## 2015-04-16 MED ORDER — ESCITALOPRAM OXALATE 10 MG PO TABS
20.0000 mg | ORAL_TABLET | Freq: Every day | ORAL | Status: DC
  Administered 2015-04-17 – 2015-04-18 (×2): 20 mg via ORAL
  Filled 2015-04-16 (×2): qty 2

## 2015-04-16 MED ORDER — PROPOFOL 10 MG/ML IV BOLUS
INTRAVENOUS | Status: DC | PRN
  Administered 2015-04-16: 130 mg via INTRAVENOUS
  Administered 2015-04-16: 20 mg via INTRAVENOUS
  Administered 2015-04-16: 10 mg via INTRAVENOUS

## 2015-04-16 MED ORDER — MONTELUKAST SODIUM 10 MG PO TABS
10.0000 mg | ORAL_TABLET | Freq: Every day | ORAL | Status: DC
  Administered 2015-04-16 – 2015-04-17 (×2): 10 mg via ORAL
  Filled 2015-04-16 (×2): qty 1

## 2015-04-16 MED ORDER — ALBUTEROL SULFATE (2.5 MG/3ML) 0.083% IN NEBU
INHALATION_SOLUTION | RESPIRATORY_TRACT | Status: AC
  Filled 2015-04-16: qty 3

## 2015-04-16 MED ORDER — IPRATROPIUM BROMIDE 0.02 % IN SOLN
0.5000 mg | Freq: Four times a day (QID) | RESPIRATORY_TRACT | Status: DC | PRN
  Administered 2015-04-17: 0.5 mg via RESPIRATORY_TRACT
  Filled 2015-04-16: qty 2.5

## 2015-04-16 MED ORDER — MENTHOL 3 MG MT LOZG: 1.0000 | LOZENGE | OROMUCOSAL | Status: DC | PRN

## 2015-04-16 MED ORDER — PROPOFOL 10 MG/ML IV BOLUS
INTRAVENOUS | Status: AC
  Filled 2015-04-16: qty 20

## 2015-04-16 MED ORDER — NITROGLYCERIN 0.4 MG SL SUBL: 0.4000 mg | SUBLINGUAL_TABLET | SUBLINGUAL | Status: DC | PRN

## 2015-04-16 MED ORDER — ALBUTEROL SULFATE (2.5 MG/3ML) 0.083% IN NEBU
2.5000 mg | INHALATION_SOLUTION | RESPIRATORY_TRACT | Status: DC | PRN
  Administered 2015-04-17 (×2): 2.5 mg via RESPIRATORY_TRACT
  Filled 2015-04-16 (×2): qty 3

## 2015-04-16 MED ORDER — BUPIVACAINE-EPINEPHRINE (PF) 0.25% -1:200000 IJ SOLN
INTRAMUSCULAR | Status: DC | PRN
  Administered 2015-04-16: 10 mL via PERINEURAL

## 2015-04-16 MED ORDER — OXYCODONE-ACETAMINOPHEN 10-325 MG PO TABS
1.0000 | ORAL_TABLET | ORAL | Status: DC | PRN
Start: 2015-04-16 — End: 2015-10-25

## 2015-04-16 MED ORDER — DEXTROSE 5 % IV SOLN
500.0000 mg | Freq: Four times a day (QID) | INTRAVENOUS | Status: DC | PRN
  Filled 2015-04-16: qty 5

## 2015-04-16 MED ORDER — FEBUXOSTAT 40 MG PO TABS
40.0000 mg | ORAL_TABLET | ORAL | Status: DC | PRN
  Filled 2015-04-16: qty 1

## 2015-04-16 MED ORDER — OXYCODONE HCL 5 MG/5ML PO SOLN
5.0000 mg | Freq: Once | ORAL | Status: DC | PRN
Start: 2015-04-16 — End: 2015-04-16

## 2015-04-16 MED ORDER — TRETINOIN 0.1 % EX CREA: 1.0000 "application " | TOPICAL_CREAM | CUTANEOUS | Status: DC

## 2015-04-16 MED ORDER — ALBUTEROL SULFATE (2.5 MG/3ML) 0.083% IN NEBU
INHALATION_SOLUTION | RESPIRATORY_TRACT | Status: AC
  Administered 2015-04-16: 2.5 mg via RESPIRATORY_TRACT
  Filled 2015-04-16: qty 3

## 2015-04-16 MED ORDER — METHOCARBAMOL 500 MG PO TABS
500.0000 mg | ORAL_TABLET | Freq: Four times a day (QID) | ORAL | Status: DC | PRN
  Administered 2015-04-17 – 2015-04-18 (×3): 500 mg via ORAL
  Filled 2015-04-16 (×4): qty 1

## 2015-04-16 MED ORDER — ASPIRIN EC 81 MG PO TBEC
81.0000 mg | DELAYED_RELEASE_TABLET | Freq: Every day | ORAL | Status: DC
  Administered 2015-04-16 – 2015-04-18 (×3): 81 mg via ORAL
  Filled 2015-04-16 (×3): qty 1

## 2015-04-16 MED ORDER — CEFAZOLIN SODIUM-DEXTROSE 2-3 GM-% IV SOLR
2.0000 g | INTRAVENOUS | Status: AC
  Administered 2015-04-16: 2 g via INTRAVENOUS
  Filled 2015-04-16: qty 50

## 2015-04-16 MED ORDER — ALBUTEROL SULFATE (2.5 MG/3ML) 0.083% IN NEBU
2.5000 mg | INHALATION_SOLUTION | Freq: Once | RESPIRATORY_TRACT | Status: AC
  Administered 2015-04-16: 2.5 mg via RESPIRATORY_TRACT

## 2015-04-16 MED ORDER — FENTANYL CITRATE (PF) 250 MCG/5ML IJ SOLN
INTRAMUSCULAR | Status: AC
  Filled 2015-04-16: qty 5

## 2015-04-16 MED ORDER — CEFAZOLIN SODIUM-DEXTROSE 2-3 GM-% IV SOLR
2.0000 g | Freq: Two times a day (BID) | INTRAVENOUS | Status: AC
  Administered 2015-04-17 (×2): 2 g via INTRAVENOUS
  Filled 2015-04-16 (×3): qty 50

## 2015-04-16 MED ORDER — SPIRONOLACTONE 25 MG PO TABS
25.0000 mg | ORAL_TABLET | Freq: Every day | ORAL | Status: DC
  Administered 2015-04-16 – 2015-04-18 (×3): 25 mg via ORAL
  Filled 2015-04-16 (×3): qty 1

## 2015-04-16 MED ORDER — FUROSEMIDE 40 MG PO TABS
80.0000 mg | ORAL_TABLET | Freq: Two times a day (BID) | ORAL | Status: DC
  Administered 2015-04-16 – 2015-04-18 (×4): 80 mg via ORAL
  Filled 2015-04-16 (×4): qty 2

## 2015-04-16 MED ORDER — BUPIVACAINE-EPINEPHRINE (PF) 0.25% -1:200000 IJ SOLN
INTRAMUSCULAR | Status: AC
  Filled 2015-04-16: qty 30

## 2015-04-16 MED ORDER — MOMETASONE FUROATE 0.1 % EX CREA: 1.0000 "application " | TOPICAL_CREAM | Freq: Every day | CUTANEOUS | Status: DC

## 2015-04-16 MED ORDER — PANTOPRAZOLE SODIUM 40 MG PO TBEC
80.0000 mg | DELAYED_RELEASE_TABLET | Freq: Every day | ORAL | Status: DC
  Administered 2015-04-17 – 2015-04-18 (×2): 80 mg via ORAL
  Filled 2015-04-16 (×2): qty 2

## 2015-04-16 MED ORDER — METOCLOPRAMIDE HCL 5 MG/ML IJ SOLN: 5.0000 mg | Freq: Three times a day (TID) | INTRAMUSCULAR | Status: DC | PRN

## 2015-04-16 MED ORDER — DEXTROSE 5 % IV SOLN
10.0000 mg | INTRAVENOUS | Status: DC | PRN
  Administered 2015-04-16: 20 ug/min via INTRAVENOUS

## 2015-04-16 MED ORDER — MOMETASONE FURO-FORMOTEROL FUM 200-5 MCG/ACT IN AERO
2.0000 | INHALATION_SPRAY | Freq: Two times a day (BID) | RESPIRATORY_TRACT | Status: DC
  Administered 2015-04-16 – 2015-04-18 (×4): 2 via RESPIRATORY_TRACT
  Filled 2015-04-16: qty 8.8

## 2015-04-16 MED ORDER — ACETAMINOPHEN 650 MG RE SUPP: 650.0000 mg | Freq: Four times a day (QID) | RECTAL | Status: DC | PRN

## 2015-04-16 MED ORDER — FENTANYL CITRATE (PF) 100 MCG/2ML IJ SOLN
INTRAMUSCULAR | Status: AC
Start: 2015-04-16 — End: 2015-04-16
  Administered 2015-04-16: 50 ug
  Filled 2015-04-16: qty 2

## 2015-04-16 MED ORDER — POTASSIUM CHLORIDE CRYS ER 20 MEQ PO TBCR
20.0000 meq | EXTENDED_RELEASE_TABLET | Freq: Every day | ORAL | Status: DC
  Administered 2015-04-16 – 2015-04-18 (×3): 20 meq via ORAL
  Filled 2015-04-16 (×3): qty 1

## 2015-04-16 MED ORDER — CALCITRIOL 0.25 MCG PO CAPS
0.2500 ug | ORAL_CAPSULE | Freq: Every day | ORAL | Status: DC
  Administered 2015-04-16 – 2015-04-18 (×3): 0.25 ug via ORAL
  Filled 2015-04-16 (×3): qty 1

## 2015-04-16 MED ORDER — DOXAZOSIN MESYLATE 4 MG PO TABS
4.0000 mg | ORAL_TABLET | Freq: Every day | ORAL | Status: DC
  Administered 2015-04-17 – 2015-04-18 (×2): 4 mg via ORAL
  Filled 2015-04-16 (×4): qty 1

## 2015-04-16 MED ORDER — MOMETASONE FURO-FORMOTEROL FUM 100-5 MCG/ACT IN AERO: 2.0000 | INHALATION_SPRAY | Freq: Two times a day (BID) | RESPIRATORY_TRACT | Status: DC

## 2015-04-16 MED ORDER — MAGNESIUM GLUCONATE 500 MG PO TABS: 500.0000 mg | ORAL_TABLET | Freq: Every day | ORAL | Status: DC

## 2015-04-16 MED ORDER — ATORVASTATIN CALCIUM 10 MG PO TABS
20.0000 mg | ORAL_TABLET | Freq: Every day | ORAL | Status: DC
  Administered 2015-04-16 – 2015-04-18 (×3): 20 mg via ORAL
  Filled 2015-04-16 (×3): qty 2

## 2015-04-16 MED ORDER — ROCURONIUM BROMIDE 50 MG/5ML IV SOLN
INTRAVENOUS | Status: AC
  Filled 2015-04-16: qty 1

## 2015-04-16 MED ORDER — ONDANSETRON HCL 4 MG/2ML IJ SOLN
INTRAMUSCULAR | Status: DC | PRN
  Administered 2015-04-16: 4 mg via INTRAVENOUS

## 2015-04-16 MED ORDER — ACETAMINOPHEN 325 MG PO TABS: 650.0000 mg | ORAL_TABLET | Freq: Four times a day (QID) | ORAL | Status: DC | PRN

## 2015-04-16 MED ORDER — LACTATED RINGERS IV SOLN
INTRAVENOUS | Status: DC
  Administered 2015-04-16 (×2): via INTRAVENOUS

## 2015-04-16 MED ORDER — HYDROMORPHONE HCL 1 MG/ML IJ SOLN: 0.2500 mg | INTRAMUSCULAR | Status: DC | PRN

## 2015-04-16 MED ORDER — SODIUM CHLORIDE 0.9 % IJ SOLN
INTRAMUSCULAR | Status: AC
  Filled 2015-04-16: qty 10

## 2015-04-16 MED ORDER — ISOSORBIDE MONONITRATE ER 30 MG PO TB24
30.0000 mg | ORAL_TABLET | Freq: Every day | ORAL | Status: DC
  Administered 2015-04-17 – 2015-04-18 (×2): 30 mg via ORAL
  Filled 2015-04-16 (×2): qty 1

## 2015-04-16 MED ORDER — PHENOL 1.4 % MT LIQD: 1.0000 | OROMUCOSAL | Status: DC | PRN

## 2015-04-16 MED ORDER — ALBUTEROL SULFATE HFA 108 (90 BASE) MCG/ACT IN AERS
INHALATION_SPRAY | RESPIRATORY_TRACT | Status: AC
  Filled 2015-04-16: qty 6.7

## 2015-04-16 MED ORDER — PHENYLEPHRINE 40 MCG/ML (10ML) SYRINGE FOR IV PUSH (FOR BLOOD PRESSURE SUPPORT)
PREFILLED_SYRINGE | INTRAVENOUS | Status: AC
  Filled 2015-04-16: qty 10

## 2015-04-16 MED ORDER — LIDOCAINE HCL (CARDIAC) 20 MG/ML IV SOLN
INTRAVENOUS | Status: AC
  Filled 2015-04-16: qty 5

## 2015-04-16 MED ORDER — ALBUTEROL SULFATE HFA 108 (90 BASE) MCG/ACT IN AERS
INHALATION_SPRAY | RESPIRATORY_TRACT | Status: DC | PRN
  Administered 2015-04-16: 8 via RESPIRATORY_TRACT

## 2015-04-16 MED ORDER — PROMETHAZINE HCL 25 MG/ML IJ SOLN
6.2500 mg | INTRAMUSCULAR | Status: DC | PRN
Start: 2015-04-16 — End: 2015-04-16

## 2015-04-16 MED ORDER — HYDROMORPHONE HCL 1 MG/ML IJ SOLN
0.5000 mg | INTRAMUSCULAR | Status: DC | PRN
  Administered 2015-04-16 – 2015-04-18 (×7): 1 mg via INTRAVENOUS
  Filled 2015-04-16 (×7): qty 1

## 2015-04-16 MED ORDER — METOCLOPRAMIDE HCL 5 MG PO TABS: 5.0000 mg | ORAL_TABLET | Freq: Three times a day (TID) | ORAL | Status: DC | PRN

## 2015-04-16 MED ORDER — SUCCINYLCHOLINE CHLORIDE 20 MG/ML IJ SOLN
INTRAMUSCULAR | Status: DC | PRN
  Administered 2015-04-16: 100 mg via INTRAVENOUS

## 2015-04-16 MED ORDER — NEBIVOLOL HCL 10 MG PO TABS
20.0000 mg | ORAL_TABLET | Freq: Every day | ORAL | Status: DC
  Administered 2015-04-17 – 2015-04-18 (×2): 20 mg via ORAL
  Filled 2015-04-16 (×2): qty 2

## 2015-04-16 MED ORDER — ONDANSETRON HCL 4 MG/2ML IJ SOLN: 4.0000 mg | Freq: Four times a day (QID) | INTRAMUSCULAR | Status: DC | PRN

## 2015-04-16 MED ORDER — SUCCINYLCHOLINE CHLORIDE 20 MG/ML IJ SOLN
INTRAMUSCULAR | Status: AC
  Filled 2015-04-16: qty 1

## 2015-04-16 MED ORDER — ALLOPURINOL 100 MG PO TABS
200.0000 mg | ORAL_TABLET | Freq: Every day | ORAL | Status: DC
  Administered 2015-04-17 – 2015-04-18 (×2): 200 mg via ORAL
  Filled 2015-04-16 (×2): qty 2

## 2015-04-16 MED ORDER — METHOCARBAMOL 500 MG PO TABS: 500.0000 mg | ORAL_TABLET | Freq: Three times a day (TID) | ORAL | Status: DC | PRN

## 2015-04-16 MED ORDER — FLUTICASONE PROPIONATE HFA 44 MCG/ACT IN AERO
2.0000 | INHALATION_SPRAY | Freq: Two times a day (BID) | RESPIRATORY_TRACT | Status: DC
  Administered 2015-04-16 – 2015-04-18 (×4): 2 via RESPIRATORY_TRACT
  Filled 2015-04-16: qty 10.6

## 2015-04-16 MED ORDER — CHLORHEXIDINE GLUCONATE 4 % EX LIQD: 60.0000 mL | Freq: Once | CUTANEOUS | Status: DC

## 2015-04-16 MED ORDER — ONDANSETRON HCL 4 MG/2ML IJ SOLN
INTRAMUSCULAR | Status: AC
  Filled 2015-04-16: qty 2

## 2015-04-16 MED ORDER — MOMETASONE FUROATE 0.1 % EX CREA
1.0000 "application " | TOPICAL_CREAM | Freq: Every day | CUTANEOUS | Status: DC | PRN
  Filled 2015-04-16: qty 15

## 2015-04-16 MED ORDER — SODIUM CHLORIDE 0.9 % IV SOLN
INTRAVENOUS | Status: DC
  Administered 2015-04-16: 21:00:00 via INTRAVENOUS

## 2015-04-16 MED ORDER — OXYCODONE HCL 5 MG PO TABS: 5.0000 mg | ORAL_TABLET | Freq: Once | ORAL | Status: DC | PRN

## 2015-04-16 MED ORDER — AMLODIPINE BESYLATE 10 MG PO TABS
10.0000 mg | ORAL_TABLET | Freq: Every day | ORAL | Status: DC
Start: 2015-04-17 — End: 2015-04-18
  Administered 2015-04-17 – 2015-04-18 (×2): 10 mg via ORAL
  Filled 2015-04-16 (×2): qty 1

## 2015-04-16 MED ORDER — FLUTICASONE PROPIONATE 0.05 % EX CREA: 1.0000 "application " | TOPICAL_CREAM | Freq: Every day | CUTANEOUS | Status: DC

## 2015-04-16 MED ORDER — NYSTATIN 100000 UNIT/ML MT SUSP
5.0000 mL | Freq: Four times a day (QID) | OROMUCOSAL | Status: DC
Start: 2015-04-16 — End: 2015-04-18
  Administered 2015-04-16 – 2015-04-18 (×7): 500000 [IU] via ORAL
  Filled 2015-04-16 (×10): qty 5

## 2015-04-16 MED ORDER — ONDANSETRON HCL 4 MG PO TABS: 4.0000 mg | ORAL_TABLET | Freq: Four times a day (QID) | ORAL | Status: DC | PRN

## 2015-04-16 MED ORDER — SODIUM CHLORIDE 0.9 % IR SOLN
Status: DC | PRN
  Administered 2015-04-16: 1

## 2015-04-16 MED ORDER — ROPIVACAINE HCL 5 MG/ML IJ SOLN
INTRAMUSCULAR | Status: DC | PRN
  Administered 2015-04-16: 25 mL via PERINEURAL

## 2015-04-16 MED ORDER — IPRATROPIUM BROMIDE 0.02 % IN SOLN: 500.0000 ug | Freq: Four times a day (QID) | RESPIRATORY_TRACT | Status: DC | PRN

## 2015-04-16 MED ORDER — FENTANYL CITRATE (PF) 100 MCG/2ML IJ SOLN
INTRAMUSCULAR | Status: DC | PRN
  Administered 2015-04-16: 100 ug via INTRAVENOUS

## 2015-04-16 SURGICAL SUPPLY — 55 items
BLADE SAW SAG 73X25 THK (BLADE) ×2
BLADE SAW SGTL 73X25 THK (BLADE) ×1 IMPLANT
BOWL SMART MIX CTS (DISPOSABLE) ×3 IMPLANT
CAPT SHLDR PARTIAL 2 ×3 IMPLANT
CLOSURE WOUND 1/2 X4 (GAUZE/BANDAGES/DRESSINGS) ×1
COVER SURGICAL LIGHT HANDLE (MISCELLANEOUS) ×3 IMPLANT
DRAPE IMP U-DRAPE 54X76 (DRAPES) ×3 IMPLANT
DRAPE INCISE IOBAN 66X45 STRL (DRAPES) ×6 IMPLANT
DRAPE U-SHAPE 47X51 STRL (DRAPES) ×3 IMPLANT
DRILL BIT 5/64 (BIT) ×3 IMPLANT
DRSG ADAPTIC 3X8 NADH LF (GAUZE/BANDAGES/DRESSINGS) ×3 IMPLANT
DRSG PAD ABDOMINAL 8X10 ST (GAUZE/BANDAGES/DRESSINGS) ×3 IMPLANT
DURAPREP 26ML APPLICATOR (WOUND CARE) ×3 IMPLANT
ELECT NEEDLE TIP 2.8 STRL (NEEDLE) ×3 IMPLANT
ELECT REM PT RETURN 9FT ADLT (ELECTROSURGICAL) ×3
ELECTRODE REM PT RTRN 9FT ADLT (ELECTROSURGICAL) ×1 IMPLANT
GAUZE SPONGE 4X4 12PLY STRL (GAUZE/BANDAGES/DRESSINGS) ×3 IMPLANT
GLOVE BIOGEL PI ORTHO PRO 7.5 (GLOVE) ×2
GLOVE BIOGEL PI ORTHO PRO SZ8 (GLOVE) ×2
GLOVE ORTHO TXT STRL SZ7.5 (GLOVE) ×3 IMPLANT
GLOVE PI ORTHO PRO STRL 7.5 (GLOVE) ×1 IMPLANT
GLOVE PI ORTHO PRO STRL SZ8 (GLOVE) ×1 IMPLANT
GLOVE SURG ORTHO 8.5 STRL (GLOVE) ×3 IMPLANT
GOWN STRL REUS W/ TWL XL LVL3 (GOWN DISPOSABLE) ×2 IMPLANT
GOWN STRL REUS W/TWL XL LVL3 (GOWN DISPOSABLE) ×4
KIT BASIN OR (CUSTOM PROCEDURE TRAY) ×3 IMPLANT
KIT ROOM TURNOVER OR (KITS) ×3 IMPLANT
MANIFOLD NEPTUNE II (INSTRUMENTS) ×3 IMPLANT
MARKER SKIN DUAL TIP RULER LAB (MISCELLANEOUS) ×3 IMPLANT
NDL SUT .5 MAYO 1.404X.05X (NEEDLE) ×1 IMPLANT
NEEDLE HYPO 25GX1X1/2 BEV (NEEDLE) ×3 IMPLANT
NEEDLE MAYO TAPER (NEEDLE) ×2
NS IRRIG 1000ML POUR BTL (IV SOLUTION) ×3 IMPLANT
PACK SHOULDER (CUSTOM PROCEDURE TRAY) ×3 IMPLANT
PACK UNIVERSAL I (CUSTOM PROCEDURE TRAY) ×3 IMPLANT
PAD ARMBOARD 7.5X6 YLW CONV (MISCELLANEOUS) ×6 IMPLANT
SLING ARM IMMOBILIZER LRG (SOFTGOODS) ×3 IMPLANT
SLING ARM IMMOBILIZER MED (SOFTGOODS) IMPLANT
SPONGE GAUZE 4X4 12PLY STER LF (GAUZE/BANDAGES/DRESSINGS) ×3 IMPLANT
SPONGE LAP 18X18 X RAY DECT (DISPOSABLE) ×3 IMPLANT
STRIP CLOSURE SKIN 1/2X4 (GAUZE/BANDAGES/DRESSINGS) ×2 IMPLANT
SUCTION FRAZIER TIP 10 FR DISP (SUCTIONS) ×3 IMPLANT
SUT BONE WAX W31G (SUTURE) ×3 IMPLANT
SUT FIBERWIRE #2 38 T-5 BLUE (SUTURE) ×6
SUT MNCRL AB 4-0 PS2 18 (SUTURE) ×3 IMPLANT
SUT VIC AB 0 CT1 27 (SUTURE) ×2
SUT VIC AB 0 CT1 27XBRD ANBCTR (SUTURE) ×1 IMPLANT
SUT VIC AB 2-0 CT1 27 (SUTURE) ×2
SUT VIC AB 2-0 CT1 TAPERPNT 27 (SUTURE) ×1 IMPLANT
SUTURE FIBERWR #2 38 T-5 BLUE (SUTURE) ×2 IMPLANT
SYR CONTROL 10ML LL (SYRINGE) ×3 IMPLANT
TOWEL OR 17X24 6PK STRL BLUE (TOWEL DISPOSABLE) ×3 IMPLANT
TOWEL OR 17X26 10 PK STRL BLUE (TOWEL DISPOSABLE) ×3 IMPLANT
TRAY FOLEY CATH 16FRSI W/METER (SET/KITS/TRAYS/PACK) IMPLANT
WATER STERILE IRR 1000ML POUR (IV SOLUTION) ×3 IMPLANT

## 2015-04-16 NOTE — Brief Op Note (Signed)
04/16/2015  3:25 PM  PATIENT:  [name] [name]  74 y.o. female  PRE-OPERATIVE DIAGNOSIS:  right shoulder bone infarct and AVN/collapse  POST-OPERATIVE DIAGNOSIS:  same  PROCEDURE:  Procedure(s): RIGHT SHOULDER HEMI-ARTHROPLASTY WITH BIOPSY (Right) (sent humeral head to pathology)  SURGEON:  Surgeon(s) and Role:    * Beverely Low, MD - Primary  PHYSICIAN ASSISTANT:   ASSISTANTS: Thea Gist, PA-C   ANESTHESIA:   regional and general  EBL:  Total I/O In: -  Out: 100 [Blood:100]  BLOOD ADMINISTERED:none  DRAINS: none   LOCAL MEDICATIONS USED:  MARCAINE     SPECIMEN:  Source of Specimen:  humeral head to pathology  DISPOSITION OF SPECIMEN:  PATHOLOGY  COUNTS:  YES  TOURNIQUET:  * No tourniquets in log *  DICTATION: .Other Dictation: Dictation Number [id number]  PLAN OF CARE: Admit to inpatient   PATIENT DISPOSITION:  PACU - hemodynamically stable.   Delay start of Pharmacological VTE agent (>24hrs) due to surgical blood loss or risk of bleeding: no

## 2015-04-16 NOTE — Discharge Instructions (Signed)
Ice to the shoulder at all times.  Do not push, pull or lift with the right arm. It is ok to use the arm for light daily activity.  Keep the incision clean and dry for 5 days, then ok to get wet in the shower.  Use the sling on the right arm while up and walking. Ok to remove and hug a pillow while in the home.  It is better to sleep propped up in a recliner hugging the pillow initially.  Do exercises every hour while awake.  Follow up with Dr Ranell Patrick in the office in two weeks  [phone]

## 2015-04-16 NOTE — Interval H&P Note (Signed)
History and Physical Interval Note:  04/16/2015 12:58 PM  [name] [name]  has presented today for surgery, with the diagnosis of right shoulder avascular necrosis and mass  The various methods of treatment have been discussed with the patient and family. After consideration of risks, benefits and other options for treatment, the patient has consented to  Procedure(s): RIGHT SHOULDER HEMI-ARTHROPLASTY WITH BIOPSY (Right) as a surgical intervention .  The patient's history has been reviewed, patient examined, no change in status, stable for surgery.  I have reviewed the patient's chart and labs.  Questions were answered to the patient's satisfaction.     Joyce Heitman,STEVEN R

## 2015-04-16 NOTE — Anesthesia Procedure Notes (Addendum)
Anesthesia Regional Block:  Interscalene brachial plexus block  Pre-Anesthetic Checklist: ,, timeout performed, Correct Patient, Correct Site, Correct Laterality, Correct Procedure, Correct Position, site marked, Risks and benefits discussed,  Surgical consent,  Pre-op evaluation,  At surgeon's request and post-op pain management  Laterality: Right  Prep: chloraprep       Needles:  Injection technique: Single-shot  Needle Type: Echogenic Stimulator Needle     Needle Length: 9cm 9 cm Needle Gauge: 21 and 21 G    Additional Needles:  Procedures: ultrasound guided (picture in chart) and nerve stimulator Interscalene brachial plexus block  Nerve Stimulator or Paresthesia:  Response: deltoid, 0.6 mA,   Additional Responses:   Narrative:  Start time: 04/16/2015 12:40 PM End time: 04/16/2015 12:53 PM Injection made incrementally with aspirations every 5 mL.  Performed by: Personally  Anesthesiologist: Marcene Duos  Additional Notes: Risks and benefits discussed. Pt tolerated well with no immediate complications.   Procedure Name: Intubation Date/Time: 04/16/2015 1:56 PM Performed by: Sharlene Dory E Pre-anesthesia Checklist: Patient identified, Timeout performed, Emergency Drugs available, Suction available and Patient being monitored Patient Re-evaluated:Patient Re-evaluated prior to inductionOxygen Delivery Method: Circle system utilized Preoxygenation: Pre-oxygenation with 100% oxygen Intubation Type: IV induction Ventilation: Mask ventilation without difficulty Laryngoscope Size: Mac and 3 Grade View: Grade I Tube type: Oral Tube size: 7.0 mm Number of attempts: 1 Airway Equipment and Method: Stylet Placement Confirmation: ETT inserted through vocal cords under direct vision,  breath sounds checked- equal and bilateral,  positive ETCO2 and CO2 detector Secured at: 22 cm Tube secured with: Tape Dental Injury: Teeth and Oropharynx as per pre-operative assessment

## 2015-04-16 NOTE — Interval H&P Note (Signed)
History and Physical Interval Note:  04/16/2015 12:44 PM  [name] [name]  has presented today for surgery, with the diagnosis of right shoulder avascular necrosis and mass  The various methods of treatment have been discussed with the patient and family. After consideration of risks, benefits and other options for treatment, the patient has consented to  Procedure(s): RIGHT SHOULDER HEMI-ARTHROPLASTY WITH BIOPSY (Right) as a surgical intervention .  The patient's history has been reviewed, patient examined, no change in status, stable for surgery.  I have reviewed the patient's chart and labs.  Questions were answered to the patient's satisfaction.     [name],STEVEN R

## 2015-04-16 NOTE — H&P (Signed)
[name] [name] is an 74 y.o. female.    Chief Complaint: right shoulder pain  HPI: Pt is a 73 y.o. female complaining of right shoulder pain for multiple months. Pain had continually increased since the beginning. X-rays in the clinic show avascular necrosis of the right humeral head. Pt has tried various conservative treatments which have failed to alleviate their symptoms, including injections and therapy. Various options are discussed with the patient. Risks, benefits and expectations were discussed with the patient. Patient understand the risks, benefits and expectations and wishes to proceed with surgery.   PCP:  Charletta Cousin, MD  D/C Plans: Home  PMH: Past Medical History  Diagnosis Date  . Hypertension   . History of blood clots     Right leg & lung  . High cholesterol   . COPD (chronic obstructive pulmonary disease)   . Asthma   . GERD (gastroesophageal reflux disease)   . PONV (postoperative nausea and vomiting)   . MI (myocardial infarction)     X 2  . Heart murmur   . Shortness of breath   . Stroke   . Anxiety   . Depression   . Difficulty swallowing solids   . Stomach ulcer   . Diarrhea   . Gout   . History of shingles   . Rash     MD aware and pt was given Monistat to use; on stomach, neck (hairline)  . Bruises easily   . Renal disorder     PSH: Past Surgical History  Procedure Laterality Date  . Abdominal hysterectomy    . Breast surgery Left   . Appendectomy    . Colonoscopy w/ polypectomy    . Upper gi endoscopy    . Esophagus surgery      Esophagus stretched during endoscopy  . Ivc filter    . Eye surgery Bilateral     Cataract removal    Social History:  reports that she has been smoking Cigarettes.  She has a 32.5 pack-year smoking history. She does not have any smokeless tobacco history on file. She reports that she does not drink alcohol or use illicit drugs.  Allergies:  Allergies  Allergen Reactions  . Ciprofloxacin     rash     Medications: Current Facility-Administered Medications  Medication Dose Route Frequency Provider Last Rate Last Dose  . ceFAZolin (ANCEF) IVPB 2 g/50 mL premix  2 g Intravenous On Call to [place], PA-C      . chlorhexidine (HIBICLENS) 4 % liquid 4 application  60 mL Topical Once Merla Riches, PA-C        Results for orders placed or performed during the hospital encounter of 04/15/15 (from the past 48 hour(s))  Surgical pcr screen     Status: None   Collection Time: 04/15/15 11:49 AM  Result Value Ref Range   MRSA, PCR NEGATIVE NEGATIVE   Staphylococcus aureus NEGATIVE NEGATIVE    Comment:        The Xpert SA Assay (FDA approved for NASAL specimens in patients over 29 years of age), is one component of a comprehensive surveillance program.  Test performance has been validated by [facility] for patients greater than or equal to 8 year old. It is not intended to diagnose infection nor to guide or monitor treatment.   Type and screen     Status: None   Collection Time: 04/15/15 11:50 AM  Result Value Ref Range   ABO/RH(D) O NEG    Antibody Screen  NEG    Sample Expiration 31/54/0086   Basic metabolic panel     Status: Abnormal   Collection Time: 04/15/15 11:52 AM  Result Value Ref Range   Sodium 142 135 - 145 mmol/L   Potassium 3.4 (L) 3.5 - 5.1 mmol/L   Chloride 97 (L) 101 - 111 mmol/L   CO2 32 22 - 32 mmol/L   Glucose, Bld 125 (H) 65 - 99 mg/dL   BUN 22 (H) 6 - 20 mg/dL   Creatinine, Ser 1.85 (H) 0.44 - 1.00 mg/dL   Calcium 9.3 8.9 - 10.3 mg/dL   GFR calc non Af Amer 26 (L) >60 mL/min   GFR calc Af Amer 30 (L) >60 mL/min    Comment: (NOTE) The eGFR has been calculated using the CKD EPI equation. This calculation has not been validated in all clinical situations. eGFR's persistently <60 mL/min signify possible Chronic Kidney Disease.    Anion gap 13 5 - 15  CBC     Status: Abnormal   Collection Time: 04/15/15 11:52 AM  Result Value Ref Range   WBC 12.5  (H) 4.0 - 10.5 K/uL   RBC 3.96 3.87 - 5.11 MIL/uL   Hemoglobin 13.2 12.0 - 15.0 g/dL   HCT 39.5 36.0 - 46.0 %   MCV 99.7 78.0 - 100.0 fL   MCH 33.3 26.0 - 34.0 pg   MCHC 33.4 30.0 - 36.0 g/dL   RDW 14.0 11.5 - 15.5 %   Platelets 292 150 - 400 K/uL  PT- INR at PAT visit (Pre-admission Testing)     Status: None   Collection Time: 04/15/15 11:52 AM  Result Value Ref Range   Prothrombin Time 13.5 11.6 - 15.2 seconds   INR 1.02 0.00 - 1.49  PTT Day of Surgery     Status: None   Collection Time: 04/15/15 11:52 AM  Result Value Ref Range   aPTT 24 24 - 37 seconds   No results found.  ROS: Pain with rom of the right upper extremity  Physical Exam:  Alert and oriented 74 y.o. female in no acute distress Cranial nerves 2-12 intact Cervical spine: full rom with no tenderness, nv intact distally Chest: active breath sounds bilaterally, no wheeze rhonchi or rales Heart: regular rate and rhythm, no murmur Abd: non tender non distended with active bowel sounds Hip is stable with rom  Right shoulder with moderate pain with rom nv intact distally No rashes or edema Strength of ER and IR 4.5/5  Assessment/Plan Assessment: right shoulder avascular necrosis  Plan: Patient will undergo a right shoulder hemiarthroplasty by Dr. Veverly Fells at [facility]. Risks benefits and expectations were discussed with the patient. Patient understand risks, benefits and expectations and wishes to proceed.

## 2015-04-16 NOTE — Anesthesia Postprocedure Evaluation (Signed)
  Anesthesia Post-op Note  Patient: Primary school teacher  Procedure(s) Performed: Procedure(s): RIGHT SHOULDER HEMI-ARTHROPLASTY WITH BIOPSY (Right)  Patient Location: PACU  Anesthesia Type:GA combined with regional for post-op pain  Level of Consciousness: oriented, sedated, patient cooperative and responds to stimulation and voice  Airway and Oxygen Therapy: Patient Spontanous Breathing and Patient connected to nasal cannula oxygen  Post-op Pain: none  Post-op Assessment: Post-op Vital signs reviewed, Patient's Cardiovascular Status Stable, Respiratory Function Stable, Patent Airway, No signs of Nausea or vomiting and Pain level controlled  Post-op Vital Signs: Reviewed and stable  Last Vitals:  Filed Vitals:   04/16/15 1530  BP: 142/59  Pulse: 72  Temp: 36.8 C  Resp: 19    Complications: No apparent anesthesia complications

## 2015-04-16 NOTE — Transfer of Care (Signed)
Immediate Anesthesia Transfer of Care Note  Patient: [name] [name]  Procedure(s) Performed: Procedure(s): RIGHT SHOULDER HEMI-ARTHROPLASTY WITH BIOPSY (Right)  Patient Location: PACU  Anesthesia Type:General  Level of Consciousness: awake, alert  and oriented  Airway & Oxygen Therapy: Patient Spontanous Breathing and Patient connected to nasal cannula oxygen  Post-op Assessment: Report given to RN and Post -op Vital signs reviewed and stable  Post vital signs: Reviewed and stable  Last Vitals:  Filed Vitals:   04/16/15 1300  BP:   Pulse: 63  Temp:   Resp: 9    Complications: No apparent anesthesia complications   Pt denies pain or SOB.

## 2015-04-17 DIAGNOSIS — M87821 Other osteonecrosis, right humerus: Secondary | ICD-10-CM | POA: Diagnosis not present

## 2015-04-17 LAB — BASIC METABOLIC PANEL
Anion gap: 10 (ref 5–15)
BUN: 22 mg/dL — AB (ref 6–20)
CO2: 29 mmol/L (ref 22–32)
Calcium: 8.3 mg/dL — ABNORMAL LOW (ref 8.9–10.3)
Chloride: 100 mmol/L — ABNORMAL LOW (ref 101–111)
Creatinine, Ser: 1.86 mg/dL — ABNORMAL HIGH (ref 0.44–1.00)
GFR calc non Af Amer: 26 mL/min — ABNORMAL LOW (ref >60)
GFR, EST AFRICAN AMERICAN: 30 mL/min — AB (ref >60)
Glucose, Bld: 125 mg/dL — ABNORMAL HIGH (ref 65–99)
POTASSIUM: 3.7 mmol/L (ref 3.5–5.1)
Sodium: 139 mmol/L (ref 135–145)

## 2015-04-17 LAB — HEMOGLOBIN AND HEMATOCRIT, BLOOD
HCT: 34.1 % — ABNORMAL LOW (ref 36.0–46.0)
HEMOGLOBIN: 11.1 g/dL — AB (ref 12.0–15.0)

## 2015-04-17 NOTE — Progress Notes (Signed)
Occupational Therapy Evaluation Patient Details Name: [name] [name] MRN: [id number] DOB: 1941/08/12 Today's Date: 04/17/2015    History of Present Illness is a 74 y.o. female complaining of right shoulder pain for multiple months. Pain had continually increased since the beginning. X-rays in the clinic show avascular necrosis of the right humeral head. Pt s/p R shoulder hemi-anthroplasty with biopsy   Clinical Impression   Patient independent PTA. Patient currently requires mod assist for UB ADLs, min assist for LB ADLs, and up to min guard assist for functional mobility without using an AD. Patient will benefit from acute OT to increase overall independence in the areas of ADLs, functional mobility, and overall safety in order to safely discharge home with 24/7 supervision/assistance.     Follow Up Recommendations  Home health OT;Supervision/Assistance - 24 hour    Equipment Recommendations  3 in 1 bedside comode    Recommendations for Other Services PT consult     Precautions / Restrictions Precautions Precautions: Shoulder Shoulder Interventions: Shoulder sling/immobilizer;For comfort (comfort and sleep per order) Restrictions Weight Bearing Restrictions: Yes RUE Weight Bearing: Non weight bearing      Mobility Bed Mobility Overal bed mobility: Needs Assistance Bed Mobility: Rolling;Sidelying to Sit;Supine to Sit Rolling: Supervision Sidelying to sit: Supervision Supine to sit: Supervision     General bed mobility comments: Heavily using rails. To left side of bed. Patient states she will only be able to get out of right side of bed at home. Patient is NWB>RUE and recommending left side bed mobility for now.   Transfers Overall transfer level: Needs assistance Equipment used: None Transfers: Sit to/from Stand Sit to Stand: Min guard General transfer comment: Patient unsteady on feet during sit<>stands and functional ambulation, furniture walking.     Balance  Overall balance assessment: Needs assistance Sitting-balance support: No upper extremity supported;Feet supported Sitting balance-Leahy Scale: Fair     Standing balance support: No upper extremity supported;During functional activity Standing balance-Leahy Scale: Poor    ADL Overall ADL's : Needs assistance/impaired   General ADL Comments: Patient limited by pain and lethargy this session. Patient with difficulty donning/doffing of sling and required up to mod assist for UB ADLs and min assist for LB ADLs. Patient unsteady during functional ambulation and required up to min guard assist. Recommending PT consult and HHOT.     Pertinent Vitals/Pain Pain Assessment: 0-10 Pain Score: 10-Worst pain ever Pain Location: right shoulder "it still hurts bad!" Pain Descriptors / Indicators: Aching;Grimacing Pain Intervention(s): Limited activity within patient's tolerance;Monitored during session;Premedicated before session;Repositioned     Hand Dominance Right   Extremity/Trunk Assessment Upper Extremity Assessment Upper Extremity Assessment: RUE deficits/detail RUE Deficits / Details: recent surgery, sling for comfort, ROM restrictions    Lower Extremity Assessment Lower Extremity Assessment: Overall WFL for tasks assessed   Cervical / Trunk Assessment Cervical / Trunk Assessment: Normal   Communication Communication Communication: No difficulties   Cognition Arousal/Alertness: Awake/alert Behavior During Therapy: WFL for tasks assessed/performed Overall Cognitive Status: Within Functional Limits for tasks assessed         Shoulder Instructions Shoulder Instructions Donning/doffing shirt without moving shoulder: Minimal assistance Method for sponge bathing under operated UE: Minimal assistance Donning/doffing sling/immobilizer: Moderate assistance Correct positioning of sling/immobilizer: Minimal assistance Pendulum exercises (written home exercise program):  (n/a) ROM for  elbow, wrist and digits of operated UE: Supervision/safety Sling wearing schedule (on at all times/off for ADL's): Supervision/safety Proper positioning of operated UE when showering: Minimal assistance Positioning of UE while sleeping: Minimal  assistance    Home Living Family/patient expects to be discharged to:: Private residence Living Arrangements: Alone Available Help at Discharge: Family;Available PRN/intermittently (daughter and sister can assist intermittently ) Type of Home: Mobile home Home Access: Stairs to enter Entrance Stairs-Number of Steps: 4 Entrance Stairs-Rails: Can reach both Home Layout: One level     Bathroom Shower/Tub: Tub/shower unit;Curtain   Firefighter: Standard     Home Equipment: Grab bars - tub/shower;Shower seat   Prior Functioning/Environment Level of Independence: Independent      OT Diagnosis: Generalized weakness;Acute pain   OT Problem List: Decreased strength;Decreased range of motion;Decreased activity tolerance;Impaired balance (sitting and/or standing);Decreased safety awareness;Decreased knowledge of use of DME or AE;Pain   OT Treatment/Interventions: Self-care/ADL training;Therapeutic exercise;Energy conservation;DME and/or AE instruction;Balance training;Therapeutic activities;Patient/family education    OT Goals(Current goals can be found in the care plan section) Acute Rehab OT Goals Patient Stated Goal: pain to decrease OT Goal Formulation: With patient Time For Goal Achievement: 04/24/15 Potential to Achieve Goals: Good ADL Goals Pt Will Perform Grooming: standing;with supervision Pt Will Perform Upper Body Bathing: with set-up;sitting Pt Will Perform Lower Body Bathing: with supervision;sit to/from stand Pt Will Perform Upper Body Dressing: with set-up;sitting Pt Will Perform Lower Body Dressing: with supervision;sit to/from stand Pt Will Transfer to Toilet: with supervision;bedside commode;ambulating Pt Will Perform  Tub/Shower Transfer: Tub transfer;shower seat;ambulating;with supervision Additional ADL Goal #1: Patient will be supervision with donning/doffing of sling  OT Frequency: Min 2X/week   Barriers to D/C: Decreased caregiver support     End of Session Equipment Utilized During Treatment: Other (comment) (sling > RUE) Nurse Communication: Other (comment) (recommendation for PT consult)  Activity Tolerance: Patient limited by lethargy;Patient limited by pain Patient left: in chair;with call bell/phone within reach   Time: 0801-0850 OT Time Calculation (min): 49 min Charges:  OT General Charges $OT Visit: 1 Procedure OT Evaluation $Initial OT Evaluation Tier I: 1 Procedure OT Treatments $Self Care/Home Management : 8-22 mins $Therapeutic Exercise: 8-22 mins G-Codes: OT G-codes **NOT FOR INPATIENT CLASS** Functional Limitation: Self care Self Care Current Status (G3151): At least 20 percent but less than 40 percent impaired, limited or restricted Self Care Goal Status (V6160): At least 1 percent but less than 20 percent impaired, limited or restricted  [name] [name] , MS, OTR/L, CLT Pager: [phone]  04/17/2015, 9:11 AM

## 2015-04-17 NOTE — Progress Notes (Signed)
Subjective: 1 Day Post-Op Procedure(s) (LRB): RIGHT SHOULDER HEMI-ARTHROPLASTY WITH BIOPSY (Right) Patient reports pain as severe.  Working with PT now.  No n/v/f/c.  Doesn't feel up to going home due to pain.  Still requiring IV pain meds.  Objective: Vital signs in last 24 hours: Temp:  [98 F (36.7 C)-98.6 F (37 C)] 98.6 F (37 C) (05/14 0530) Pulse Rate:  [62-82] 82 (05/14 0530) Resp:  [9-19] 18 (05/14 0530) BP: (118-169)/(48-71) 140/71 mmHg (05/14 0530) SpO2:  [87 %-100 %] 93 % (05/14 0530) FiO2 (%):  [28 %] 28 % (05/13 1925) Weight:  [92.08 kg (203 lb)] 92.08 kg (203 lb) (05/13 1057)  Intake/Output from previous day: 05/13 0701 - 05/14 0700 In: 1918.3 [P.O.:600; I.V.:1268.3; IV Piggyback:50] Out: 300 [Urine:200; Blood:100] Intake/Output this shift: Total I/O In: 517.5 [I.V.:517.5] Out: -    Recent Labs  04/15/15 1152 04/17/15 0751  HGB 13.2 11.1*    Recent Labs  04/15/15 1152 04/17/15 0751  WBC 12.5*  --   RBC 3.96  --   HCT 39.5 34.1*  PLT 292  --     Recent Labs  04/15/15 1152  NA 142  K 3.4*  CL 97*  CO2 32  BUN 22*  CREATININE 1.85*  GLUCOSE 125*  CALCIUM 9.3    Recent Labs  04/15/15 1152  INR 1.02    PE:  wn wd woman in mild distress due to pain.  R shoulder dressed and dry.  NVI at R UE.  Assessment/Plan: 1 Day Post-Op Procedure(s) (LRB): RIGHT SHOULDER HEMI-ARTHROPLASTY WITH BIOPSY (Right) Up with therapy  We'll keep her another day and plan d/c tomorrow when pain is better controlled.  Toni Arthurs 04/17/2015, 8:40 AM

## 2015-04-17 NOTE — Op Note (Signed)
[name] [name], [name] [name] NO.:  [id number]  MEDICAL RECORD NO.:  [id number]  LOCATION:  5N12C                        FACILITY:  [facility]  PHYSICIAN:  Almedia Balls. Ranell Patrick, M.D. 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  PROCEDURE PERFORMED:  Right shoulder hemiarthroplasty using [place] Global Unite System.  ATTENDING SURGEON:  Almedia Balls. Ranell Patrick, M.D.  ASSISTANT:  Donnie Coffin. Dixon, PA-C, who scrubbed the entire procedure and necessary for satisfactory completion of surgery.  ANESTHESIA:  General anesthesia was used plus interscalene block.  ESTIMATED BLOOD LOSS:  Less than 100 mL.  FLUID REPLACEMENT:  1200 mL crystalloid.  INSTRUMENT COUNTS:  Correct.  COMPLICATIONS:  There were no complications.  ANTIBIOTICS:  Perioperative antibiotics were given.  INDICATIONS:  The patient is a 74 year old female with worsening right shoulder pain secondary to right proximal humeral bone infarct with AVN segment in the humeral head and collapse.  The MRI was read out as possible osteomyelitis and possible malignancy, thus due to progression of pain and also with functional limitations, we elected to proceed with surgical hemiarthroplasty and we would want to have sending the humeral head and any other bone suspicious for Pathology.  The patient agreed with this plan and informed consent was obtained.  DESCRIPTION OF PROCEDURE:  After adequate level of anesthesia was achieved, the patient was positioned in a modified beach-chair position. Right shoulder correctly identified and sterilely prepped and draped in usual manner.  Time-out called.  We entered the shoulder using  standard deltopectoral approach starting at the coracoid process extending down to the anterior humerus.  Dissection down to the subcutaneous tissues using Bovie, identified cephalic vein, took it laterally with deltoid, pectoralis taken medially.  Conjoined tendon identified and retracted medially.  The subscapularis was taken subperiosteally off the lesser tuberosity and tagged for repair at the end of surgery.  We released the inferior capsule off the inferior neck of the humerus.  We then performed our humeral head cut with the humeral head guide, placed the T- handled Crego elevator up under the rotator cuff to protect it, and we placed the elbow at the patient's side and the forearm in 20 degrees to 30 degrees of external rotation.  We made our cut with an oscillating saw protecting the inferior structures with a large fat Crego elevator. We removed excess bone spur and remaining head off the humeral neck.  We then went ahead and prepared our humeral canal reaming up to a size 12 and then broaching for the 10 and the 12 Global Unite stem.  Once we had that broaching done, we impacted the trial stem, placed a 40 x 18 eccentric humeral head component on, and reduced the shoulder.  We were pleased with soft tissue balancing and the bone coverage, and we could translate the humeral head posteriorly 50% of the width of the glenoid, inferiorly 50%, the glenoid face  had some chondromalacia present, not terribly worn, but little bit present.  Biceps was released off the superior labrum, which was intact.  We did a finger sweep around the back making sure there were no loose bodies present.  Thorough irrigation of the capsule which we did not remove as it was not diseased.  We then went ahead and removed our trial components, drilled drill holes in the lesser tuberosity for repair of the subscapularis. We placed #2 FiberWire suture through that in a mattress fashion with the suture limbs  sticking out of the bone.  We then used the [place] Global Unite Stem and this was the 12 body, 12 proximal portion, and impacted that in place, which gave [place] excellent purchase and rotational stability.  We then selected the 40 x 18 eccentric humeral head component, dialed the centricity posterior superiorly.  We impacted that in position, reduced the shoulder, checked our axillary nerve to make sure it was free and clear and repaired subscapularis anatomically to bone including rotator interval.  We had nice repair of the subscap.  We also tenodesed the biceps.  We ranged her shoulder.  We had excellent mobility and stability.  We then irrigated the subdeltoid plane, repaired the deltopectoral interval with 0-Vicryl suture followed by 2-0 Vicryl subcutaneous closure and 4-0 Monocryl for skin.  Steri-Strips applied followed by sterile dressing.  The patient tolerated the surgery well.     Almedia Balls. Ranell Patrick, M.D.     SRN/MEDQ  D:  04/16/2015  T:  04/17/2015  Job:  [id number]

## 2015-04-18 DIAGNOSIS — M87821 Other osteonecrosis, right humerus: Secondary | ICD-10-CM | POA: Diagnosis not present

## 2015-04-18 NOTE — Progress Notes (Signed)
Occupational Therapy Treatment Patient Details Name: [name] [name] MRN: [id number] DOB: 02/17/1941 Today's Date: 04/18/2015    History of present illness Pt is a 74 y.o. female complaining of right shoulder pain for multiple months. Pain had continually increased since the beginning. X-rays in the clinic show avascular necrosis of the right humeral head. Pt s/p R shoulder hemi-anthroplasty with biopsy   OT comments  Pt seen today for therapeutic exercises. Pt unable to recall education and training from previous session and is limited by pain. Pt actively participated in therapeutic exercises this date. Pt will continue to benefit from acute OT to address POC.    Follow Up Recommendations  Home health OT;Supervision/Assistance - 24 hour    Equipment Recommendations  3 in 1 bedside comode    Recommendations for Other Services PT consult    Precautions / Restrictions Precautions Precautions: Shoulder Type of Shoulder Precautions: Active Protocol: AROM/PROM FF 0-90, ABD 0-60, ER 0-30* Shoulder Interventions: Shoulder sling/immobilizer;For comfort (for comfort and sleep) Precaution Booklet Issued: Yes (comment) Precaution Comments: Educated pt on shoulder precautions and incorporating into ADLs Required Braces or Orthoses: Sling Restrictions Weight Bearing Restrictions: Yes RUE Weight Bearing: Non weight bearing       Mobility Bed Mobility               General bed mobility comments: Pt sitting up when OT arrived  Transfers Overall transfer level: Needs assistance Equipment used: None Transfers: Sit to/from [employer] Sit to Stand: Min guard Stand pivot transfers: Min guard       General transfer comment: Min guard for safety. Pt mildly unsteady        ADL Overall ADL's : Needs assistance/impaired Eating/Feeding: Set up;Sitting Eating/Feeding Details (indicate cue type and reason): assist to open containers and cut food Grooming: Set  up;Sitting           Upper Body Dressing : Maximal assistance;Sitting       Toilet Transfer: Min guard;Stand-pivot             General ADL Comments: Pt c/o pain, however able to participate fully in therapeutic ROM without difficulty. Pt unable to recall education and training from previous session. No family present. Educated pt on UE therapeutic exercises with pt return demo.                Cognition  Arousal/Alertness: Awake/Alert Behavior During Therapy: WFL for tasks assessed/performed Overall Cognitive Status: Within Functional Limits for tasks assessed                         Exercises Shoulder Exercises Shoulder Flexion: Right;10 reps;Supine;AAROM Shoulder ABduction: Right;10 reps;Supine;AAROM Elbow Flexion: AROM;Right;10 reps;Seated Elbow Extension: AROM;Right;10 reps;Seated Wrist Flexion: AROM;Right;10 reps;Seated Wrist Extension: AROM;Right;10 reps;Seated Method for sponge bathing under operated UE: Minimal assistance Donning/doffing sling/immobilizer: Maximal assistance ROM for elbow, wrist and digits of operated UE: Supervision/safety Sling wearing schedule (on at all times/off for ADL's): Supervision/safety Proper positioning of operated UE when showering: Minimal assistance Positioning of UE while sleeping: Minimal assistance   Shoulder Instructions Shoulder Instructions Method for sponge bathing under operated UE: Minimal assistance Donning/doffing sling/immobilizer: Maximal assistance ROM for elbow, wrist and digits of operated UE: Supervision/safety Sling wearing schedule (on at all times/off for ADL's): Supervision/safety Proper positioning of operated UE when showering: Minimal assistance Positioning of UE while sleeping: Minimal assistance          Pertinent Vitals/ Pain       Pain Assessment: 0-10  Pain Score: 9  Pain Location: right shoulder Pain Descriptors / Indicators: Aching;Throbbing Pain Intervention(s): Limited  activity within patient's tolerance;Monitored during session;Repositioned;Premedicated before session         Frequency Min 2X/week     Progress Toward Goals  OT Goals(current goals can now be found in the care plan section)  Progress towards OT goals: Progressing toward goals  Acute Rehab OT Goals Patient Stated Goal: home today OT Goal Formulation: With patient Time For Goal Achievement: 04/24/15 Potential to Achieve Goals: Good  Plan Discharge plan remains appropriate       End of Session Equipment Utilized During Treatment: Gait belt;Other (comment) (sling)   Activity Tolerance Patient tolerated treatment well   Patient Left in chair;with call bell/phone within reach   Nurse Communication      Functional Assessment Tool Used: clinical judgement Functional Limitation: Self care Self Care Current Status (L5449): At least 20 percent but less than 40 percent impaired, limited or restricted Self Care Goal Status (E0100): At least 1 percent but less than 20 percent impaired, limited or restricted   Time: 1005-1020 OT Time Calculation (min): 15 min  Charges: OT G-codes **NOT FOR INPATIENT CLASS** Functional Assessment Tool Used: clinical judgement Functional Limitation: Self care Self Care Current Status (F1219): At least 20 percent but less than 40 percent impaired, limited or restricted Self Care Goal Status (X5883): At least 1 percent but less than 20 percent impaired, limited or restricted OT General Charges $OT Visit: 1 Procedure OT Treatments $Therapeutic Exercise: 8-22 mins  Rae Lips 04/18/2015, 10:33 AM  Carney Living, OTR/L Occupational Therapist [phone] (pager)

## 2015-04-18 NOTE — Progress Notes (Signed)
     Subjective: 2 Days Post-Op Procedure(s) (LRB): RIGHT SHOULDER HEMI-ARTHROPLASTY WITH BIOPSY (Right)   Patient reports pain as moderate, controlled with medication. No events throughout the night. Ready to be discharged home.  Objective:   VITALS:   Filed Vitals:   04/18/15 0555  BP: 145/76  Pulse: 74  Temp: 98 F (36.7 C)  Resp: 18    Dorsiflexion/Plantar flexion intact Incision: dressing C/D/I No cellulitis present Compartment soft  LABS  Recent Labs  04/17/15 0751  HGB 11.1*  HCT 34.1*     Recent Labs  04/17/15 0751  NA 139  K 3.7  BUN 22*  CREATININE 1.86*  GLUCOSE 125*     Assessment/Plan: 2 Days Post-Op Procedure(s) (LRB): RIGHT SHOULDER HEMI-ARTHROPLASTY WITH BIOPSY (Right) Instructed to follow activity as instructed and shown by PT. Up with therapy Discharge home with home health  Follow up in 2 weeks at [facility]. Follow up with Dr. Ranell Patrick in 2 weeks.  Contact information:  [facility] [address], [address] [place] [address] [phone]        Anastasio Auerbach. Hewitt Garner   PAC  04/18/2015, 2:16 PM

## 2015-04-19 ENCOUNTER — Encounter (HOSPITAL_COMMUNITY): Payer: Self-pay | Admitting: Orthopedic Surgery

## 2015-04-19 NOTE — Discharge Summary (Signed)
Physician Discharge Summary  Patient ID: [name] [name] MRN: [id number] DOB/AGE: 72-Dec-1942 72 y.o.  Admit date: 04/16/2015 Discharge date: 04/18/2015   Procedures:  Procedure(s) (LRB): RIGHT SHOULDER HEMI-ARTHROPLASTY WITH BIOPSY (Right)  Attending Physician:  Dr. Beverely Low   Admission Diagnoses:   Right shoulder pain  Discharge Diagnoses:  Active Problems:   S/P shoulder replacement  Past Medical History  Diagnosis Date  . Hypertension   . History of blood clots     Right leg & lung  . High cholesterol   . COPD (chronic obstructive pulmonary disease)   . Asthma   . GERD (gastroesophageal reflux disease)   . PONV (postoperative nausea and vomiting)   . MI (myocardial infarction)     X 2  . Heart murmur   . Shortness of breath   . Stroke   . Anxiety   . Depression   . Difficulty swallowing solids   . Stomach ulcer   . Diarrhea   . Gout   . History of shingles   . Rash     MD aware and pt was given Monistat to use; on stomach, neck (hairline)  . Bruises easily   . Renal disorder     HPI:    Pt is a 74 y.o. female complaining of right shoulder pain for multiple months. Pain had continually increased since the beginning. X-rays in the clinic show avascular necrosis of the right humeral head. Pt has tried various conservative treatments which have failed to alleviate their symptoms, including injections and therapy. Various options are discussed with the patient. Risks, benefits and expectations were discussed with the patient. Patient understand the risks, benefits and expectations and wishes to proceed with surgery.   PCP: Konrad Felix, MD   Discharged Condition: good  Hospital Course:  Patient underwent the above stated procedure on 04/16/2015. Patient tolerated the procedure well and brought to the recovery room in good condition and subsequently to the floor.  POD #1 BP: 140/71 ; Pulse: 82 ; Temp: 98.6 F (37 C) ; Resp: 18 Patient reports pain as severe.  Working with PT now. No n/v/f/c. Doesn't feel up to going home due to pain. Still requiring IV pain meds. Wn wd woman in mild distress due to pain. R shoulder dressed and dry. NVI at R UE.  LABS  Basename    HGB  11.1  HCT  34.1   POD #2  BP: 145/76 ; Pulse: 74 ; Temp: 98 F (36.7 C) ; Resp: 18 Patient reports pain as moderate, controlled with medication. No events throughout the night. Ready to be discharged home. Dorsiflexion/plantar flexion intact, incision: dressing C/D/I, no cellulitis present and compartment soft.   LABS   No new labs  Discharge Exam: General appearance: alert, cooperative and no distress  Disposition: Home with follow up in 2 weeks   Follow-up Information    Follow up with NORRIS,STEVEN R, MD. Call in 2 weeks.   Specialty:  Orthopedic Surgery   Why:  [phone]   Contact information:   [address] [place] [address] [phone]           Medication List    TAKE these medications        albuterol (2.5 MG/3ML) 0.083% nebulizer solution  Commonly known as:  PROVENTIL  Take 3 mLs (2.5 mg total) by nebulization every 4 (four) hours as needed for wheezing or shortness of breath.     allopurinol 100 MG tablet  Commonly known as:  ZYLOPRIM  Take 2 tablets by mouth daily.     amLODipine 10 MG tablet  Commonly known as:  NORVASC  Take 1 tablet (10 mg total) by mouth daily.     aspirin 81 MG EC tablet  Take 1 tablet (81 mg total) by mouth daily.     atorvastatin 20 MG tablet  Commonly known as:  LIPITOR  Take 1 tablet by mouth daily.     beclomethasone 80 MCG/ACT inhaler  Commonly known as:  QVAR  Inhale 2 puffs into the lungs 1 day or 1 dose.     calcitRIOL 0.25 MCG capsule  Commonly known as:  ROCALTROL  Take 1 capsule by mouth daily.     doxazosin 4 MG tablet  Commonly known as:  CARDURA  Take 1 tablet by mouth daily.     escitalopram 20 MG tablet  Commonly known as:  LEXAPRO  Take 1 tablet (20 mg  total) by mouth daily.     febuxostat 40 MG tablet  Commonly known as:  ULORIC  Take 40 mg by mouth as needed (uric acid).     fluticasone 0.05 % cream  Commonly known as:  CUTIVATE  Apply 1 application topically daily.     Fluticasone-Salmeterol 250-50 MCG/DOSE Aepb  Commonly known as:  ADVAIR  Inhale 1 puff into the lungs every 12 (twelve) hours.     furosemide 80 MG tablet  Commonly known as:  LASIX  Take 80 mg by mouth 2 (two) times daily.     ipratropium 0.02 % nebulizer solution  Commonly known as:  ATROVENT  Take 2.5 mLs (500 mcg total) by nebulization every 6 (six) hours as needed for wheezing ((with Xopenex)).     isosorbide mononitrate 30 MG 24 hr tablet  Commonly known as:  IMDUR  Take 1 tablet by mouth daily.     magnesium gluconate 500 MG tablet  Commonly known as:  MAGONATE  Take 500 mg by mouth daily.     methocarbamol 500 MG tablet  Commonly known as:  ROBAXIN  Take 1 tablet (500 mg total) by mouth 3 (three) times daily as needed.     mometasone 0.1 % cream  Commonly known as:  ELOCON  Apply 1 application topically daily.     mometasone-formoterol 200-5 MCG/ACT Aero  Commonly known as:  DULERA  Inhale 2 puffs into the lungs as needed for wheezing.     montelukast 10 MG tablet  Commonly known as:  SINGULAIR  Take 10 mg by mouth at bedtime.     nebivolol 10 MG tablet  Commonly known as:  BYSTOLIC  Take 20 mg by mouth daily.     nitroGLYCERIN 0.4 MG SL tablet  Commonly known as:  NITROSTAT  Place 0.4 mg under the tongue every 5 (five) minutes as needed for chest pain.     nystatin 100000 UNIT/ML suspension  Commonly known as:  MYCOSTATIN  Take 5 mLs by mouth 4 (four) times daily. Swish and Swallow %mL by mouth daily     omeprazole 20 MG capsule  Commonly known as:  PRILOSEC  Take 20 mg by mouth daily.     oxyCODONE-acetaminophen 10-325 MG per tablet  Commonly known as:  PERCOCET  Take 0.5 tablets by mouth at bedtime as needed for pain. pain      oxyCODONE-acetaminophen 10-325 MG per tablet  Commonly known as:  PERCOCET  Take 1 tablet by mouth every 4 (four) hours as needed for pain.     potassium chloride  SA 20 MEQ tablet  Commonly known as:  K-DUR,KLOR-CON  Take 1 tablet (20 mEq total) by mouth daily.     spironolactone 25 MG tablet  Commonly known as:  ALDACTONE  Take 25 mg by mouth daily.     tretinoin 0.1 % cream  Commonly known as:  RETIN-A  Apply 1 application topically as directed.         Signed: Anastasio Auerbach. Jabriel Vanduyne   PA-C  04/19/2015, 12:41 PM

## 2015-09-06 ENCOUNTER — Encounter: Payer: Self-pay | Admitting: *Deleted

## 2015-09-07 ENCOUNTER — Ambulatory Visit (INDEPENDENT_AMBULATORY_CARE_PROVIDER_SITE_OTHER): Payer: Medicare Other | Admitting: Cardiology

## 2015-09-07 ENCOUNTER — Encounter: Payer: Self-pay | Admitting: Cardiology

## 2015-09-07 VITALS — BP 134/70 | HR 68 | Ht 63.0 in | Wt 216.0 lb

## 2015-09-07 DIAGNOSIS — J441 Chronic obstructive pulmonary disease with (acute) exacerbation: Secondary | ICD-10-CM | POA: Diagnosis not present

## 2015-09-07 DIAGNOSIS — I2510 Atherosclerotic heart disease of native coronary artery without angina pectoris: Secondary | ICD-10-CM

## 2015-09-07 DIAGNOSIS — I2583 Coronary atherosclerosis due to lipid rich plaque: Secondary | ICD-10-CM

## 2015-09-07 DIAGNOSIS — I10 Essential (primary) hypertension: Secondary | ICD-10-CM

## 2015-09-07 DIAGNOSIS — I5033 Acute on chronic diastolic (congestive) heart failure: Secondary | ICD-10-CM

## 2015-09-07 MED ORDER — METOLAZONE 2.5 MG PO TABS: 2.5000 mg | ORAL_TABLET | ORAL | Status: DC

## 2015-09-07 NOTE — Patient Instructions (Signed)
Medication Instructions:   START TAKING METOLAZONE 2.5 MG BY MOUTH EVERY OTHER DAY---PLEASE TAKE THIS MEDICATION 30 MINUTES PRIOR TO TAKING YOUR MORNING DOSE OF LASIX   Labwork:  ONE MONTH--SAME DAY AS YOUR ONE MONTH FOLLOW-UP APPOINTMENT WITH DR Delton See TO CHECK A CMET AND BNP      Follow-Up:  ONE MONTH WITH DR Delton See OR AT VERY NEXT AVAILABLE NEAR THAT TIME---PLEASE HAVE YOUR LABS DONE THE VERY SAME DAY AS THIS APPOINTMENT

## 2015-09-07 NOTE — Progress Notes (Signed)
Patient ID: Lydie Stammen, female   DOB: 03-06-1941, 74 y.o.   MRN: [id number] Patient ID: Cherese Lozano, female   DOB: [birth year], 74 y.o.   MRN: [id number]    Patient Name: [name] [name] Date of Encounter: 09/07/2015  Primary Care Provider:  Konrad Felix, MD Primary Cardiologist:  Lars Masson  Problem List   Past Medical History  Diagnosis Date  . Hypertension   . History of blood clots     Right leg & lung  . High cholesterol   . COPD (chronic obstructive pulmonary disease) (HCC)   . Asthma   . GERD (gastroesophageal reflux disease)   . PONV (postoperative nausea and vomiting)   . MI (myocardial infarction) (HCC)     X 2  . Heart murmur   . Shortness of breath   . Stroke (HCC)   . Anxiety   . Depression   . Difficulty swallowing solids   . Stomach ulcer   . Diarrhea   . Gout   . History of shingles   . Rash     MD aware and pt was given Monistat to use; on stomach, neck (hairline)  . Bruises easily   . Renal disorder    Past Surgical History  Procedure Laterality Date  . Abdominal hysterectomy    . Breast surgery Left   . Appendectomy    . Colonoscopy w/ polypectomy    . Upper gi endoscopy    . Esophagus surgery      Esophagus stretched during endoscopy  . Ivc filter    . Cataract extraction, bilateral Bilateral   . Shoulder hemi-arthroplasty Right 04/16/2015    Procedure: RIGHT SHOULDER HEMI-ARTHROPLASTY WITH BIOPSY;  Surgeon: Beverely Low, MD;  Location: [facility] OR;  Service: Orthopedics;  Laterality: Right;   Allergies  Allergies  Allergen Reactions  . 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 Despite stopping Lasix and ACE I her renal function progressively worsened to a peak of ~7 (baseline  of 1.2-2.5), emergent HD was initiated. Cardiac enzymes were elevated in the absence of EKG changes.  ECHO was WNL except for grade 1 diastolic dysfunction. IV Heparin was started. She stabilized and was able to transfer out to SDU. NSTEMI; chest pain had resolved, cont Imdur,ASA and statin;added BB. -8/8: stress test: No evidence for pharmacological induced ischemia. Calculated ejection fraction is 64%  Acute on chronic diastolic CHF  -Echocardiogram WNL except for grade 1 diastolic dysfunction; cont diuresis; stable.  08/04/2014 - the patient is complaining of feeling tired, weak, insomnia. No chest pain or resting SOB, positive DOE. Her lasix was recently increased to 80 mg po daily for LE edema and DOE, with some improvement in symptoms. She continues to smoke.   09/09/2014 - the patient feels exhausted all the time, no chest pain, no resting SOB, stable exertional SOB. She has been treated for anemia with improvement of Hb levels, however no improvement in symptoms. She is refusing to go to cardiac rehab. No orthopnea, PND or LE edema. Lasix was changed from 80 mg PO BID to 80 mg in the AM and 40 mg in the PM by her nephrologist. No K as she had hyperkalemia on the lab from 2 weeks ago (not in our system).  She brings BP diary and controlled most of the time.   09/07/15 - the patient is coming after 6 months, appears down, she states that she has no energy and will to do anything, she continues to smoke. She is complaining of LE edema and weight gain but blames it on course of steroids that she has just completed. She has chronic cough, now new night sweats. Follow with pulmonology, she had CXR a month ago and was told it was ok.  She denies CP, has stable DOE, 3 pillow orthopnea.   Home Medications  Prior to Admission medications   Medication Sig Start Date End Date Taking? Authorizing Provider  albuterol (PROVENTIL) (2.5 MG/3ML) 0.083% nebulizer solution Take 3 mLs (2.5 mg total) by nebulization  every 4 (four) hours as needed for wheezing or shortness of breath. 07/13/14  Yes Esperanza Sheets, MD  amLODipine (NORVASC) 10 MG tablet Take 1 tablet (10 mg total) by mouth daily. 07/13/14  Yes Esperanza Sheets, MD  aspirin EC 81 MG EC tablet Take 1 tablet (81 mg total) by mouth daily. 07/13/14  Yes Esperanza Sheets, MD  atorvastatin (LIPITOR) 40 MG tablet Take 1 tablet (40 mg total) by mouth at bedtime. 07/13/14  Yes Esperanza Sheets, MD  calcitRIOL (ROCALTROL) 0.25 MCG capsule Take 1 capsule by mouth daily. 07/30/14  Yes Historical Provider, MD  Calcium-Magnesium-Vitamin D (CALCIUM 500 PO) Take 500 mg by mouth daily.   Yes Historical Provider, MD  cloNIDine (CATAPRES) 0.1 MG tablet Take 1 tablet (0.1 mg total) by mouth every 4 (four) hours as needed (SBP > 170 or DBP >110). 07/13/14  Yes Esperanza Sheets, MD  cyproheptadine (PERIACTIN) 4 MG tablet Take 1 tablet by mouth daily. 05/24/14  Yes Historical Provider, MD  doxazosin (CARDURA) 4 MG tablet Take 1 tablet by mouth daily. 06/28/14  Yes Historical Provider, MD  escitalopram (LEXAPRO) 10 MG tablet Take 1 tablet by mouth daily. 07/29/14  Yes Historical Provider, MD  fluticasone (CUTIVATE) 0.05 % cream as directed. 05/12/14  Yes Historical Provider, MD  Fluticasone-Salmeterol (ADVAIR) 250-50 MCG/DOSE AEPB Inhale 1 puff into the lungs every 12 (twelve) hours. 07/13/14  Yes Esperanza Sheets, MD  furosemide (LASIX) 80 MG tablet Take 1 tablet (80 mg total) by mouth daily. 07/13/14  Yes Esperanza Sheets, MD  guaiFENesin (MUCINEX) 600 MG 12 hr tablet Take 1,200 mg by mouth every 12 (twelve) hours as needed for congestion.   Yes Historical Provider, MD  hydrALAZINE (APRESOLINE) 50 MG tablet Take 1 tablet by mouth daily. 06/07/14  Yes Historical Provider, MD  HYDROcodone-acetaminophen (NORCO/VICODIN) 5-325 MG per tablet Take 1 tablet by mouth every 6 (six) hours as needed for moderate pain. 07/13/14  Yes Esperanza Sheets, MD  hydrOXYzine (ATARAX/VISTARIL) 25 MG tablet  Take 1 tablet by mouth as needed. 05/12/14  Yes Historical Provider, MD  ipratropium (ATROVENT) 0.02 % nebulizer solution Take 2.5 mLs (500 mcg total) by nebulization every 6 (six) hours as needed for wheezing ((with Xopenex)). 07/13/14  Yes Esperanza Sheets, MD  isosorbide mononitrate (IMDUR) 60 MG 24 hr tablet Take 1 tablet (60 mg total) by mouth daily. 07/13/14  Yes Esperanza Sheets, MD  labetalol (NORMODYNE) 300 MG tablet Take 1 tablet by mouth daily. 07/21/14  Yes Historical Provider, MD  metoprolol (LOPRESSOR) 50 MG tablet Take 1 tablet (50 mg total) by mouth 2 (two) times daily. 07/13/14  Yes Esperanza Sheets, MD  montelukast (SINGULAIR) 10 MG tablet Take 1  tablet by mouth daily. 07/26/14  Yes Historical Provider, MD  nitroGLYCERIN (NITROSTAT) 0.4 MG SL tablet Place 0.4 mg under the tongue every 5 (five) minutes as needed for chest pain.   Yes Historical Provider, MD  omeprazole (PRILOSEC) 20 MG capsule Take 20 mg by mouth daily.   Yes Historical Provider, MD  potassium chloride (K-DUR) 10 MEQ tablet Take 1 tablet (10 mEq total) by mouth daily. 07/13/14  Yes Esperanza Sheets, MD  predniSONE (DELTASONE) 10 MG tablet Take 1 tablet (10 mg total) by mouth daily with breakfast. 07/13/14  Yes Esperanza Sheets, MD  tretinoin (RETIN-A) 0.1 % cream Apply 1 application topically as directed.   Yes Historical Provider, MD    Family History  Family History  Problem Relation Age of Onset  . Heart attack Father     in his 18s  . Congestive Heart Failure Mother     Social History  Social History   Social History  . Marital Status: Divorced    Spouse Name: N/A  . Number of Children: N/A  . Years of Education: N/A   Occupational History  . Not on file.   Social History Main Topics  . Smoking status: Current Every Day Smoker -- 0.50 packs/day for 65 years    Types: Cigarettes    Last Attempt to Quit: 02/01/2013  . Smokeless tobacco: Not on file     Comment: started at age 52, 1-2ppd  . Alcohol  Use: No  . Drug Use: No  . Sexual Activity: No   Other Topics Concern  . Not on file   Social History Narrative     Review of Systems, as per HPI, otherwise negative General:  No chills, fever, night sweats or weight changes.  Cardiovascular:  No chest pain, dyspnea on exertion, edema, orthopnea, palpitations, paroxysmal nocturnal dyspnea. Dermatological: No rash, lesions/masses Respiratory: No cough, dyspnea Urologic: No hematuria, dysuria Abdominal:   No nausea, vomiting, diarrhea, bright red blood per rectum, melena, or hematemesis Neurologic:  No visual changes, wkns, changes in mental status. All other systems reviewed and are otherwise negative except as noted above.  Physical Exam  Blood pressure 134/70, pulse 68, height  (1.6 m), weight 216 lb (97.977 kg).  General: Pleasant, NAD Psych: Normal affect. Neuro: Alert and oriented X 3. Moves all extremities spontaneously. HEENT: Normal  Neck: Supple without bruits or JVD. Lungs:  Resp regular and unlabored, end-expiratory wheezing. Heart: RRR no s3, s4, or murmurs. Abdomen: Soft, non-tender, non-distended, BS + x 4.  Extremities: No clubbing, cyanosis, B/L LE edema +1. DP/PT/Radials 2+ and equal bilaterally.  Labs:  No results for input(s): CKTOTAL, CKMB, TROPONINI in the last 72 hours. Lab Results  Component Value Date   WBC 12.5* 04/15/2015   HGB 11.1* 04/17/2015   HCT 34.1* 04/17/2015   MCV 99.7 04/15/2015   PLT 292 04/15/2015    No results found for: DDIMER Invalid input(s): POCBNP    Component Value Date/Time   NA 139 04/17/2015 0751   K 3.7 04/17/2015 0751   CL 100* 04/17/2015 0751   CO2 29 04/17/2015 0751   GLUCOSE 125* 04/17/2015 0751   BUN 22* 04/17/2015 0751   CREATININE 1.86* 04/17/2015 0751   CALCIUM 8.3* 04/17/2015 0751   PROT 6.4 03/08/2015 0913   ALBUMIN 3.8 03/08/2015 0913   AST 14 03/08/2015 0913   ALT 10 03/08/2015 0913   ALKPHOS 102 03/08/2015 0913   BILITOT 0.4 03/08/2015 0913     GFRNONAA 26*  04/17/2015 0751   GFRAA 30* 04/17/2015 0751   Accessory Clinical Findings  Echocardiogram   - Left ventricle: The cavity size was normal. There was moderate concentric hypertrophy. Systolic function was normal. The estimated ejection fraction was in the range of 60% to 65%. Wall motion was normal; there were no regional wall motion abnormalities. Doppler parameters are consistent with abnormal left ventricular relaxation (grade 1 diastolic dysfunction). There was no evidence of elevated ventricular filling pressure by Doppler parameters. - Aortic valve: Trileaflet; normal thickness leaflets. Transvalvular velocity was within the normal range. There was no stenosis. There was no regurgitation. - Aortic root: The aortic root was normal in size. - Mitral valve: Structurally normal valve. There was no regurgitation. - Right ventricle: Systolic function was normal. - Right atrium: The atrium was normal in size. - Tricuspid valve: There was no regurgitation. - Pulmonary arteries: Systolic pressure was within the normal range.  Impressions: - Moderate LVH with abnormal relaxation. Otherwise normal study.    Assessment & Plan  1. Acute on chronic diastolic CHF - Le edema, baseline weight 304, today 316 lbs, we will add metolazone 2.5 mg po to be taken every other day. Follow up in 1 month, with CMP and BNP.  2. CAD - NSTEMI, negative stress test, normal echocardiogram, preserved LVEF - Echo 07/07/2014 EF 60-65%, moderate LVH, grade 1 diastolic dysfunction  - she appears very deconditioned, I offered cardiac rehab again, she refused again even if it was free - we will continue atorvastatin, ASA, nebivolol - she refused to quit smoking and has no motivation as she states that thtas the only enjoyable thing right now - she appears very depressed, we will increase lexapro to 20 mg po daily  3. COPD - continues to smoke, followed by pulmonary  4. Acute on chronic renal  insufficiency - crea 3.3 --> 2.3-->2.8-->1.7, we will recheck in 1 month, also followed by a nephrologist  5. Tobacco abuse: she states that she stopped a year ago, but admitted to smoking at the last visit  6. 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 Hypertension - rechecked controlled  Follow up in 1 month, with CMP and BNP.  Lars Masson, MD, [facility] 09/07/2015, 9:48 AM

## 2015-10-25 ENCOUNTER — Ambulatory Visit (INDEPENDENT_AMBULATORY_CARE_PROVIDER_SITE_OTHER): Payer: Medicare Other | Admitting: Cardiology

## 2015-10-25 ENCOUNTER — Other Ambulatory Visit (INDEPENDENT_AMBULATORY_CARE_PROVIDER_SITE_OTHER): Payer: Medicare Other | Admitting: *Deleted

## 2015-10-25 ENCOUNTER — Encounter: Payer: Self-pay | Admitting: Cardiology

## 2015-10-25 VITALS — BP 156/88 | HR 71 | Ht 63.0 in | Wt 215.8 lb

## 2015-10-25 DIAGNOSIS — I2699 Other pulmonary embolism without acute cor pulmonale: Secondary | ICD-10-CM

## 2015-10-25 DIAGNOSIS — I2583 Coronary atherosclerosis due to lipid rich plaque: Secondary | ICD-10-CM

## 2015-10-25 DIAGNOSIS — I2510 Atherosclerotic heart disease of native coronary artery without angina pectoris: Secondary | ICD-10-CM

## 2015-10-25 DIAGNOSIS — I10 Essential (primary) hypertension: Secondary | ICD-10-CM

## 2015-10-25 DIAGNOSIS — J410 Simple chronic bronchitis: Secondary | ICD-10-CM | POA: Diagnosis not present

## 2015-10-25 DIAGNOSIS — I5030 Unspecified diastolic (congestive) heart failure: Secondary | ICD-10-CM

## 2015-10-25 DIAGNOSIS — I2102 ST elevation (STEMI) myocardial infarction involving left anterior descending coronary artery: Secondary | ICD-10-CM | POA: Diagnosis not present

## 2015-10-25 DIAGNOSIS — I5033 Acute on chronic diastolic (congestive) heart failure: Secondary | ICD-10-CM

## 2015-10-25 MED ORDER — ISOSORBIDE MONONITRATE ER 30 MG PO TB24: 90.0000 mg | ORAL_TABLET | Freq: Every day | ORAL | Status: AC

## 2015-10-25 NOTE — Addendum Note (Signed)
Addended by: Tonita Phoenix on: 10/25/2015 03:19 PM   Modules accepted: Orders

## 2015-10-25 NOTE — Addendum Note (Signed)
Addended by: Tonita Phoenix on: 10/25/2015 03:59 PM   Modules accepted: Orders

## 2015-10-25 NOTE — Progress Notes (Signed)
Patient ID: [name] [name], female   DOB: 10/29/1941, 74 y.o.   MRN: [id number] Patient ID: [name] [name], female   DOB: July 30, 1941, 74 y.o.   MRN: [id number] Patient ID: [name] [name], female   DOB: Jun 24, 1941, 74 y.o.   MRN: [id number]    Patient Name: [name] [name] Date of Encounter: 10/25/2015  Primary Care Provider:  Konrad Felix, MD Primary Cardiologist:  Lars Masson  Problem List   Past Medical History  Diagnosis Date  . Hypertension   . History of blood clots     Right leg & lung  . High cholesterol   . COPD (chronic obstructive pulmonary disease) (HCC)   . Asthma   . GERD (gastroesophageal reflux disease)   . PONV (postoperative nausea and vomiting)   . MI (myocardial infarction) (HCC)     X 2  . Heart murmur   . Shortness of breath   . Stroke (HCC)   . Anxiety   . Depression   . Difficulty swallowing solids   . Stomach ulcer   . Diarrhea   . Gout   . History of shingles   . Rash     MD aware and pt was given Monistat to use; on stomach, neck (hairline)  . Bruises easily   . Renal disorder    Past Surgical History  Procedure Laterality Date  . Abdominal hysterectomy    . Breast surgery Left   . Appendectomy    . Colonoscopy w/ polypectomy    . Upper gi endoscopy    . Esophagus surgery      Esophagus stretched during endoscopy  . Ivc filter    . Cataract extraction, bilateral Bilateral   . Shoulder hemi-arthroplasty Right 04/16/2015    Procedure: RIGHT SHOULDER HEMI-ARTHROPLASTY WITH BIOPSY;  Surgeon: Beverely Low, MD;  Location: [facility] OR;  Service: Orthopedics;  Laterality: Right;   Allergies  Allergies  Allergen Reactions  . 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 Despite stopping Lasix  and ACE I her renal function progressively worsened to a peak of ~7 (baseline of 1.2-2.5), emergent HD was initiated. Cardiac enzymes were elevated in the absence of EKG changes.  ECHO was WNL except for grade 1 diastolic dysfunction. IV Heparin was started. She stabilized and was able to transfer out to SDU. NSTEMI; chest pain had resolved, cont Imdur,ASA and statin;added BB. -8/8: stress test: No evidence for pharmacological induced ischemia. Calculated ejection fraction is 64%  Acute on chronic diastolic CHF  -Echocardiogram WNL except for grade 1 diastolic dysfunction; cont diuresis; stable.  08/04/2014 - the patient is complaining of feeling tired, weak, insomnia. No chest pain or resting SOB, positive DOE. Her lasix was recently increased to 80 mg po daily for LE edema and DOE, with some improvement in symptoms. She continues to smoke.   09/09/2014 - the patient feels exhausted all the time, no chest pain, no resting SOB, stable exertional SOB. She has been treated for anemia with improvement of Hb levels, however no improvement in symptoms. She is refusing to go to cardiac rehab. No orthopnea, PND or LE edema. Lasix was changed from 80 mg PO BID to 80 mg in the AM and 40 mg in the PM by her nephrologist. No K  as she had hyperkalemia on the lab from 2 weeks ago (not in our system). She brings BP diary and controlled most of the time.   10/25/15 - the patient is coming after 1 month, appears down, started on lexapro a month ago, mild improvement so far. I have added metolazone to lasix the last month, but it was discontinued by her nephrologist. She remains to have extra 9 lbs, with mild chronic LE edema. No chest pain. No palpitations or syncope. She can't afford Bystolic. She is always wheezing and continues to smoke.  Home Medications  Prior to Admission medications   Medication Sig Start Date End Date Taking? Authorizing Provider  albuterol (PROVENTIL) (2.5 MG/3ML) 0.083% nebulizer solution  Take 3 mLs (2.5 mg total) by nebulization every 4 (four) hours as needed for wheezing or shortness of breath. 07/13/14  Yes Esperanza Sheets, MD  amLODipine (NORVASC) 10 MG tablet Take 1 tablet (10 mg total) by mouth daily. 07/13/14  Yes Esperanza Sheets, MD  aspirin EC 81 MG EC tablet Take 1 tablet (81 mg total) by mouth daily. 07/13/14  Yes Esperanza Sheets, MD  atorvastatin (LIPITOR) 40 MG tablet Take 1 tablet (40 mg total) by mouth at bedtime. 07/13/14  Yes Esperanza Sheets, MD  calcitRIOL (ROCALTROL) 0.25 MCG capsule Take 1 capsule by mouth daily. 07/30/14  Yes Historical Provider, MD  Calcium-Magnesium-Vitamin D (CALCIUM 500 PO) Take 500 mg by mouth daily.   Yes Historical Provider, MD  cloNIDine (CATAPRES) 0.1 MG tablet Take 1 tablet (0.1 mg total) by mouth every 4 (four) hours as needed (SBP > 170 or DBP >110). 07/13/14  Yes Esperanza Sheets, MD  cyproheptadine (PERIACTIN) 4 MG tablet Take 1 tablet by mouth daily. 05/24/14  Yes Historical Provider, MD  doxazosin (CARDURA) 4 MG tablet Take 1 tablet by mouth daily. 06/28/14  Yes Historical Provider, MD  escitalopram (LEXAPRO) 10 MG tablet Take 1 tablet by mouth daily. 07/29/14  Yes Historical Provider, MD  fluticasone (CUTIVATE) 0.05 % cream as directed. 05/12/14  Yes Historical Provider, MD  Fluticasone-Salmeterol (ADVAIR) 250-50 MCG/DOSE AEPB Inhale 1 puff into the lungs every 12 (twelve) hours. 07/13/14  Yes Esperanza Sheets, MD  furosemide (LASIX) 80 MG tablet Take 1 tablet (80 mg total) by mouth daily. 07/13/14  Yes Esperanza Sheets, MD  guaiFENesin (MUCINEX) 600 MG 12 hr tablet Take 1,200 mg by mouth every 12 (twelve) hours as needed for congestion.   Yes Historical Provider, MD  hydrALAZINE (APRESOLINE) 50 MG tablet Take 1 tablet by mouth daily. 06/07/14  Yes Historical Provider, MD  HYDROcodone-acetaminophen (NORCO/VICODIN) 5-325 MG per tablet Take 1 tablet by mouth every 6 (six) hours as needed for moderate pain. 07/13/14  Yes Esperanza Sheets, MD    hydrOXYzine (ATARAX/VISTARIL) 25 MG tablet Take 1 tablet by mouth as needed. 05/12/14  Yes Historical Provider, MD  ipratropium (ATROVENT) 0.02 % nebulizer solution Take 2.5 mLs (500 mcg total) by nebulization every 6 (six) hours as needed for wheezing ((with Xopenex)). 07/13/14  Yes Esperanza Sheets, MD  isosorbide mononitrate (IMDUR) 60 MG 24 hr tablet Take 1 tablet (60 mg total) by mouth daily. 07/13/14  Yes Esperanza Sheets, MD  labetalol (NORMODYNE) 300 MG tablet Take 1 tablet by mouth daily. 07/21/14  Yes Historical Provider, MD  metoprolol (LOPRESSOR) 50 MG tablet Take 1 tablet (50 mg total) by mouth 2 (two) times daily. 07/13/14  Yes Esperanza Sheets, MD  montelukast (SINGULAIR) 10 MG tablet  Take 1 tablet by mouth daily. 07/26/14  Yes Historical Provider, MD  nitroGLYCERIN (NITROSTAT) 0.4 MG SL tablet Place 0.4 mg under the tongue every 5 (five) minutes as needed for chest pain.   Yes Historical Provider, MD  omeprazole (PRILOSEC) 20 MG capsule Take 20 mg by mouth daily.   Yes Historical Provider, MD  potassium chloride (K-DUR) 10 MEQ tablet Take 1 tablet (10 mEq total) by mouth daily. 07/13/14  Yes Esperanza Sheets, MD  predniSONE (DELTASONE) 10 MG tablet Take 1 tablet (10 mg total) by mouth daily with breakfast. 07/13/14  Yes Esperanza Sheets, MD  tretinoin (RETIN-A) 0.1 % cream Apply 1 application topically as directed.   Yes Historical Provider, MD    Family History  Family History  Problem Relation Age of Onset  . Heart attack Father     in his 36s  . Congestive Heart Failure Mother     Social History  Social History   Social History  . Marital Status: Divorced    Spouse Name: N/A  . Number of Children: N/A  . Years of Education: N/A   Occupational History  . Not on file.   Social History Main Topics  . Smoking status: Current Every Day Smoker -- 0.50 packs/day for 65 years    Types: Cigarettes    Last Attempt to Quit: 02/01/2013  . Smokeless tobacco: Not on file      Comment: started at age 65, 1-2ppd  . Alcohol Use: No  . Drug Use: No  . Sexual Activity: No   Other Topics Concern  . Not on file   Social History Narrative     Review of Systems, as per HPI, otherwise negative General:  No chills, fever, night sweats or weight changes.  Cardiovascular:  No chest pain, dyspnea on exertion, edema, orthopnea, palpitations, paroxysmal nocturnal dyspnea. Dermatological: No rash, lesions/masses Respiratory: No cough, dyspnea Urologic: No hematuria, dysuria Abdominal:   No nausea, vomiting, diarrhea, bright red blood per rectum, melena, or hematemesis Neurologic:  No visual changes, wkns, changes in mental status. All other systems reviewed and are otherwise negative except as noted above.  Physical Exam  Blood pressure 156/88, pulse 71, height 5\' 3"  (1.6 m), weight 215 lb 12.8 oz (97.886 kg), SpO2 95 %.  General: Pleasant, NAD Psych: Normal affect. Neuro: Alert and oriented X 3. Moves all extremities spontaneously. HEENT: Normal  Neck: Supple without bruits or JVD. Lungs:  Resp regular and unlabored, end-expiratory wheezing. Heart: RRR no s3, s4, or murmurs. Abdomen: Soft, non-tender, non-distended, BS + x 4.  Extremities: No clubbing, cyanosis, B/L LE edema +1. DP/PT/Radials 2+ and equal bilaterally.  Labs:  No results for input(s): CKTOTAL, CKMB, TROPONINI in the last 72 hours. Lab Results  Component Value Date   WBC 12.5* 04/15/2015   HGB 11.1* 04/17/2015   HCT 34.1* 04/17/2015   MCV 99.7 04/15/2015   PLT 292 04/15/2015    No results found for: DDIMER Invalid input(s): POCBNP    Component Value Date/Time   NA 139 04/17/2015 0751   K 3.7 04/17/2015 0751   CL 100* 04/17/2015 0751   CO2 29 04/17/2015 0751   GLUCOSE 125* 04/17/2015 0751   BUN 22* 04/17/2015 0751   CREATININE 1.86* 04/17/2015 0751   CALCIUM 8.3* 04/17/2015 0751   PROT 6.4 03/08/2015 0913   ALBUMIN 3.8 03/08/2015 0913   AST 14 03/08/2015 0913   ALT 10 03/08/2015  0913   ALKPHOS 102 03/08/2015 0913   BILITOT 0.4  03/08/2015 0913   GFRNONAA 26* 04/17/2015 0751   GFRAA 30* 04/17/2015 0751   Accessory Clinical Findings  Echocardiogram   - Left ventricle: The cavity size was normal. There was moderate concentric hypertrophy. Systolic function was normal. The estimated ejection fraction was in the range of 60% to 65%. Wall motion was normal; there were no regional wall motion abnormalities. Doppler parameters are consistent with abnormal left ventricular relaxation (grade 1 diastolic dysfunction). There was no evidence of elevated ventricular filling pressure by Doppler parameters. - Aortic valve: Trileaflet; normal thickness leaflets. Transvalvular velocity was within the normal range. There was no stenosis. There was no regurgitation. - Aortic root: The aortic root was normal in size. - Mitral valve: Structurally normal valve. There was no regurgitation. - Right ventricle: Systolic function was normal. - Right atrium: The atrium was normal in size. - Tricuspid valve: There was no regurgitation. - Pulmonary arteries: Systolic pressure was within the normal range.  Impressions: - Moderate LVH with abnormal relaxation. Otherwise normal study.    Assessment & Plan  1. Acute on chronic diastolic CHF - LE edema, baseline weight 204, today 216 lbs, unable to add more diuretics with CKD stage 4, I will focus on controlling her BO, increasing imdur to 90 mg po daily.   2. CAD - NSTEMI, negative stress test, normal echocardiogram, preserved LVEF - Echo 07/07/2014 EF 60-65%, moderate LVH, grade 1 diastolic dysfunction  - she appears very deconditioned, I offered cardiac rehab again, she refused again even if it was free - we will continue atorvastatin, ASA, nebivolol - she refused to quit smoking and has no motivation as she states that that the only enjoyable thing right now - she appears very depressed, we added lexapro to 20 mg po daily  3.  COPD - continues to smoke, followed by pulmonary  4. Acute on chronic renal insufficiency - crea 3.3 --> 2.3-->2.8-->1.7, followed by a nephrologist  5. Tobacco abuse: she states that she stopped a year ago, but admitted to smoking at the last visit  6. 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 Hypertension - uncontrolled, increased Imdur to 90 mg po daily  Follow up in 2 months.  Lars Masson, MD, [facility] 10/25/2015, 3:27 PM

## 2015-10-25 NOTE — Patient Instructions (Signed)
**Note De-Identified Annagrace Carr Obfuscation** Medication Instructions:  Increase Imdur to 90 mg daily-all other medications remain the same.  Labwork: None  Testing/Procedures: None  Follow-Up: Your physician recommends that you schedule a follow-up appointment in: 2 months.      If you need a refill on your cardiac medications before your next appointment, please call your pharmacy.

## 2015-10-26 ENCOUNTER — Telehealth: Payer: Self-pay

## 2015-10-26 LAB — COMPREHENSIVE METABOLIC PANEL
ALT: 19 U/L (ref 6–29)
AST: 22 U/L (ref 10–35)
Albumin: 4 g/dL (ref 3.6–5.1)
Alkaline Phosphatase: 105 U/L (ref 33–130)
BUN: 20 mg/dL (ref 7–25)
CO2: 31 mmol/L (ref 20–31)
Calcium: 9.3 mg/dL (ref 8.6–10.4)
Chloride: 97 mmol/L — ABNORMAL LOW (ref 98–110)
Creat: 2.06 mg/dL — ABNORMAL HIGH (ref 0.60–0.93)
Glucose, Bld: 73 mg/dL (ref 65–99)
Potassium: 2.8 mmol/L — ABNORMAL LOW (ref 3.5–5.3)
Sodium: 144 mmol/L (ref 135–146)
Total Bilirubin: 0.5 mg/dL (ref 0.2–1.2)
Total Protein: 6.5 g/dL (ref 6.1–8.1)

## 2015-10-26 LAB — BRAIN NATRIURETIC PEPTIDE: Brain Natriuretic Peptide: 155.2 pg/mL — ABNORMAL HIGH (ref 0.0–100.0)

## 2015-10-26 NOTE — Telephone Encounter (Signed)
Called patient about her lab results. Per Dr. Delton See, Please instruct her to decrease her lasix to 80 mg in the morning and 40 mg in the afternoon. Patient verbalized understanding.

## 2016-01-05 ENCOUNTER — Encounter: Payer: Self-pay | Admitting: Cardiology

## 2016-01-05 ENCOUNTER — Ambulatory Visit (INDEPENDENT_AMBULATORY_CARE_PROVIDER_SITE_OTHER): Payer: Medicare Other | Admitting: Cardiology

## 2016-01-05 VITALS — BP 136/74 | HR 52 | Ht 63.0 in | Wt 210.0 lb

## 2016-01-05 DIAGNOSIS — Z720 Tobacco use: Secondary | ICD-10-CM

## 2016-01-05 DIAGNOSIS — I5033 Acute on chronic diastolic (congestive) heart failure: Secondary | ICD-10-CM | POA: Diagnosis not present

## 2016-01-05 DIAGNOSIS — I10 Essential (primary) hypertension: Secondary | ICD-10-CM

## 2016-01-05 DIAGNOSIS — J441 Chronic obstructive pulmonary disease with (acute) exacerbation: Secondary | ICD-10-CM

## 2016-01-05 DIAGNOSIS — I214 Non-ST elevation (NSTEMI) myocardial infarction: Secondary | ICD-10-CM

## 2016-01-05 LAB — COMPREHENSIVE METABOLIC PANEL
ALT: 17 U/L (ref 6–29)
AST: 20 U/L (ref 10–35)
Albumin: 3.7 g/dL (ref 3.6–5.1)
Alkaline Phosphatase: 90 U/L (ref 33–130)
BUN: 31 mg/dL — ABNORMAL HIGH (ref 7–25)
CO2: 31 mmol/L (ref 20–31)
Calcium: 9.2 mg/dL (ref 8.6–10.4)
Chloride: 103 mmol/L (ref 98–110)
Creat: 1.98 mg/dL — ABNORMAL HIGH (ref 0.60–0.93)
Glucose, Bld: 115 mg/dL — ABNORMAL HIGH (ref 65–99)
Potassium: 4 mmol/L (ref 3.5–5.3)
Sodium: 143 mmol/L (ref 135–146)
Total Bilirubin: 0.4 mg/dL (ref 0.2–1.2)
Total Protein: 6 g/dL — ABNORMAL LOW (ref 6.1–8.1)

## 2016-01-05 LAB — BRAIN NATRIURETIC PEPTIDE: Brain Natriuretic Peptide: 108.4 pg/mL — ABNORMAL HIGH (ref 0.0–100.0)

## 2016-01-05 NOTE — Patient Instructions (Signed)
Medication Instructions:   DR Delton See HAS ORDERED FOR YOU TO TAKE PREDNISONE 40 MG BY MOUTH DAILY X 3 DAYS, THEN TAKE 20 MG BY MOUTH DAILY X 2 DAYS    Labwork:  TODAY--CMET AND BNP    Follow-Up:  Your physician wants you to follow-up in: 6 MONTHS WITH DR Johnell Comings will receive a reminder letter in the mail two months in advance. If you don't receive a letter, please call our office to schedule the follow-up appointment.      If you need a refill on your cardiac medications before your next appointment, please call your pharmacy.

## 2016-01-05 NOTE — Progress Notes (Signed)
Patient ID: [name] [name], female   DOB: [birth year], 75 y.o.   MRN: [id number]    Patient Name: [name] [name] Date of Encounter: 01/05/2016  Primary Care Provider:  Konrad Felix, MD Primary Cardiologist:  Lars Masson  Problem List   Past Medical History  Diagnosis Date  . Hypertension   . History of blood clots     Right leg & lung  . High cholesterol   . COPD (chronic obstructive pulmonary disease) (HCC)   . Asthma   . GERD (gastroesophageal reflux disease)   . PONV (postoperative nausea and vomiting)   . MI (myocardial infarction) (HCC)     X 2  . Heart murmur   . Shortness of breath   . Stroke (HCC)   . Anxiety   . Depression   . Difficulty swallowing solids   . Stomach ulcer   . Diarrhea   . Gout   . History of shingles   . Rash     MD aware and pt was given Monistat to use; on stomach, neck (hairline)  . Bruises easily   . Renal disorder    Past Surgical History  Procedure Laterality Date  . Abdominal hysterectomy    . Breast surgery Left   . Appendectomy    . Colonoscopy w/ polypectomy    . Upper gi endoscopy    . Esophagus surgery      Esophagus stretched during endoscopy  . Ivc filter    . Cataract extraction, bilateral Bilateral   . Shoulder hemi-arthroplasty Right 04/16/2015    Procedure: RIGHT SHOULDER HEMI-ARTHROPLASTY WITH BIOPSY;  Surgeon: Beverely Low, MD;  Location: [facility] OR;  Service: Orthopedics;  Laterality: Right;   Allergies  Allergies  Allergen Reactions  . 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 Despite stopping Lasix and ACE I her renal function progressively worsened to a peak of ~7 (baseline of 1.2-2.5), emergent HD was initiated. Cardiac enzymes were elevated in the  absence of EKG changes.  ECHO was WNL except for grade 1 diastolic dysfunction. IV Heparin was started. She stabilized and was able to transfer out to SDU. NSTEMI; chest pain had resolved, cont Imdur,ASA and statin;added BB. -8/8: stress test: No evidence for pharmacological induced ischemia. Calculated ejection fraction is 64%  Acute on chronic diastolic CHF  -Echocardiogram WNL except for grade 1 diastolic dysfunction; cont diuresis; stable.  01/05/2016 - the patient is coming after 2 month, appears down, she has been feeling significantly SOB sec to her COPD, she is unable to sleep. She hasn't been able to get her meds/inhalers sec to cost.  She got two inhalers from her pulmonary clinic but was only using it as needed. Denies fever, no PND, orthopnea or LE edema, no chest pain.  The patient states that she is not smoking but is exposed to second hand smoking.   Home Medications  Prior to Admission medications   Medication Sig Start Date End Date Taking? Authorizing Provider  albuterol (PROVENTIL) (2.5 MG/3ML) 0.083% nebulizer solution Take 3 mLs (2.5 mg total) by nebulization every 4 (four) hours as needed for wheezing or shortness of breath. 07/13/14  Yes Esperanza Sheets, MD  amLODipine (NORVASC) 10 MG tablet Take 1 tablet (10 mg total) by  mouth daily. 07/13/14  Yes Esperanza Sheets, MD  aspirin EC 81 MG EC tablet Take 1 tablet (81 mg total) by mouth daily. 07/13/14  Yes Esperanza Sheets, MD  atorvastatin (LIPITOR) 40 MG tablet Take 1 tablet (40 mg total) by mouth at bedtime. 07/13/14  Yes Esperanza Sheets, MD  calcitRIOL (ROCALTROL) 0.25 MCG capsule Take 1 capsule by mouth daily. 07/30/14  Yes Historical Provider, MD  Calcium-Magnesium-Vitamin D (CALCIUM 500 PO) Take 500 mg by mouth daily.   Yes Historical Provider, MD  cloNIDine (CATAPRES) 0.1 MG tablet Take 1 tablet (0.1 mg total) by mouth every 4 (four) hours as needed (SBP > 170 or DBP >110). 07/13/14  Yes Esperanza Sheets, MD  cyproheptadine  (PERIACTIN) 4 MG tablet Take 1 tablet by mouth daily. 05/24/14  Yes Historical Provider, MD  doxazosin (CARDURA) 4 MG tablet Take 1 tablet by mouth daily. 06/28/14  Yes Historical Provider, MD  escitalopram (LEXAPRO) 10 MG tablet Take 1 tablet by mouth daily. 07/29/14  Yes Historical Provider, MD  fluticasone (CUTIVATE) 0.05 % cream as directed. 05/12/14  Yes Historical Provider, MD  Fluticasone-Salmeterol (ADVAIR) 250-50 MCG/DOSE AEPB Inhale 1 puff into the lungs every 12 (twelve) hours. 07/13/14  Yes Esperanza Sheets, MD  furosemide (LASIX) 80 MG tablet Take 1 tablet (80 mg total) by mouth daily. 07/13/14  Yes Esperanza Sheets, MD  guaiFENesin (MUCINEX) 600 MG 12 hr tablet Take 1,200 mg by mouth every 12 (twelve) hours as needed for congestion.   Yes Historical Provider, MD  hydrALAZINE (APRESOLINE) 50 MG tablet Take 1 tablet by mouth daily. 06/07/14  Yes Historical Provider, MD  HYDROcodone-acetaminophen (NORCO/VICODIN) 5-325 MG per tablet Take 1 tablet by mouth every 6 (six) hours as needed for moderate pain. 07/13/14  Yes Esperanza Sheets, MD  hydrOXYzine (ATARAX/VISTARIL) 25 MG tablet Take 1 tablet by mouth as needed. 05/12/14  Yes Historical Provider, MD  ipratropium (ATROVENT) 0.02 % nebulizer solution Take 2.5 mLs (500 mcg total) by nebulization every 6 (six) hours as needed for wheezing ((with Xopenex)). 07/13/14  Yes Esperanza Sheets, MD  isosorbide mononitrate (IMDUR) 60 MG 24 hr tablet Take 1 tablet (60 mg total) by mouth daily. 07/13/14  Yes Esperanza Sheets, MD  labetalol (NORMODYNE) 300 MG tablet Take 1 tablet by mouth daily. 07/21/14  Yes Historical Provider, MD  metoprolol (LOPRESSOR) 50 MG tablet Take 1 tablet (50 mg total) by mouth 2 (two) times daily. 07/13/14  Yes Esperanza Sheets, MD  montelukast (SINGULAIR) 10 MG tablet Take 1 tablet by mouth daily. 07/26/14  Yes Historical Provider, MD  nitroGLYCERIN (NITROSTAT) 0.4 MG SL tablet Place 0.4 mg under the tongue every 5 (five) minutes as needed  for chest pain.   Yes Historical Provider, MD  omeprazole (PRILOSEC) 20 MG capsule Take 20 mg by mouth daily.   Yes Historical Provider, MD  potassium chloride (K-DUR) 10 MEQ tablet Take 1 tablet (10 mEq total) by mouth daily. 07/13/14  Yes Esperanza Sheets, MD  predniSONE (DELTASONE) 10 MG tablet Take 1 tablet (10 mg total) by mouth daily with breakfast. 07/13/14  Yes Esperanza Sheets, MD  tretinoin (RETIN-A) 0.1 % cream Apply 1 application topically as directed.   Yes Historical Provider, MD    Family History  Family History  Problem Relation Age of Onset  . Heart attack Father     in his 40s  . Congestive Heart Failure Mother     Social History  Social  History   Social History  . Marital Status: Divorced    Spouse Name: N/A  . Number of Children: N/A  . Years of Education: N/A   Occupational History  . Not on file.   Social History Main Topics  . Smoking status: Former Smoker -- 0.50 packs/day for 65 years    Types: Cigarettes    Quit date: 02/01/2013  . Smokeless tobacco: Not on file     Comment: started at age 57, 1-2ppd  . Alcohol Use: No  . Drug Use: No  . Sexual Activity: No   Other Topics Concern  . Not on file   Social History Narrative     Review of Systems, as per HPI, otherwise negative General:  No chills, fever, night sweats or weight changes.  Cardiovascular:  No chest pain, dyspnea on exertion, edema, orthopnea, palpitations, paroxysmal nocturnal dyspnea. Dermatological: No rash, lesions/masses Respiratory: No cough, dyspnea Urologic: No hematuria, dysuria Abdominal:   No nausea, vomiting, diarrhea, bright red blood per rectum, melena, or hematemesis Neurologic:  No visual changes, wkns, changes in mental status. All other systems reviewed and are otherwise negative except as noted above.  Physical Exam  Blood pressure 136/74, pulse 52, height 5\' 3"  (1.6 m), weight 210 lb (95.255 kg).  General: Pleasant, NAD Psych: Normal affect. Neuro: Alert  and oriented X 3. Moves all extremities spontaneously. HEENT: Normal  Neck: Supple without bruits or JVD. Lungs:  Resp regular and unlabored, end-expiratory wheezing B/L. Heart: RRR no s3, s4, or murmurs. Abdomen: Soft, non-tender, non-distended, BS + x 4.  Extremities: No clubbing, cyanosis, B/L LE edema +1. DP/PT/Radials 2+ and equal bilaterally.  Labs:  No results for input(s): CKTOTAL, CKMB, TROPONINI in the last 72 hours. Lab Results  Component Value Date   WBC 12.5* 04/15/2015   HGB 11.1* 04/17/2015   HCT 34.1* 04/17/2015   MCV 99.7 04/15/2015   PLT 292 04/15/2015    No results found for: DDIMER Invalid input(s): POCBNP    Component Value Date/Time   NA 144 10/25/2015 1559   K 2.8* 10/25/2015 1559   CL 97* 10/25/2015 1559   CO2 31 10/25/2015 1559   GLUCOSE 73 10/25/2015 1559   BUN 20 10/25/2015 1559   CREATININE 2.06* 10/25/2015 1559   CREATININE 1.86* 04/17/2015 0751   CALCIUM 9.3 10/25/2015 1559   PROT 6.5 10/25/2015 1559   ALBUMIN 4.0 10/25/2015 1559   AST 22 10/25/2015 1559   ALT 19 10/25/2015 1559   ALKPHOS 105 10/25/2015 1559   BILITOT 0.5 10/25/2015 1559   GFRNONAA 26* 04/17/2015 0751   GFRAA 30* 04/17/2015 0751   Accessory Clinical Findings  Echocardiogram   - Left ventricle: The cavity size was normal. There was moderate concentric hypertrophy. Systolic function was normal. The estimated ejection fraction was in the range of 60% to 65%. Wall motion was normal; there were no regional wall motion abnormalities. Doppler parameters are consistent with abnormal left ventricular relaxation (grade 1 diastolic dysfunction). There was no evidence of elevated ventricular filling pressure by Doppler parameters. - Aortic valve: Trileaflet; normal thickness leaflets. Transvalvular velocity was within the normal range. There was no stenosis. There was no regurgitation. - Aortic root: The aortic root was normal in size. - Mitral valve: Structurally normal  valve. There was no regurgitation. - Right ventricle: Systolic function was normal. - Right atrium: The atrium was normal in size. - Tricuspid valve: There was no regurgitation. - Pulmonary arteries: Systolic pressure was within the normal range.  Impressions: -  Moderate LVH with abnormal relaxation. Otherwise normal study.    Assessment & Plan  1. Acute COPD exacerbation - start lasix 40 mg po daily x 3 days followed by lasix 20 mg po daily x 2 days, she is advised to follow with pulmonary or her PCP afterwards.   2. Acute on chronic diastolic CHF - she appears euvolemic and her BNP today is 108.  3. CAD - NSTEMI, negative stress test, normal echocardiogram, preserved LVEF - Echo 07/07/2014 EF 60-65%, moderate LVH, grade 1 diastolic dysfunction  - she appears very deconditioned, I offered cardiac rehab again, she refused again even if it was free - we will continue atorvastatin, ASA, nebivolol - she refused to quit smoking and has no motivation as she states that that the only enjoyable thing right now - she appears very depressed, we added lexapro to 20 mg po daily  4. Acute on chronic renal insufficiency - crea 3.3 --> 2.3-->2.8-->1.7, followed by a nephrologist  5. Tobacco abuse: she states that she stopped a year ago, but admitted to smoking at the last visit  6. 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 Hypertension - uncontrolled, increased Imdur to 90 mg po daily  Follow up in 6 months.  Lars Masson, MD, [facility] 01/05/2016, 8:44 AM

## 2016-02-09 ENCOUNTER — Other Ambulatory Visit: Payer: Self-pay | Admitting: Cardiology

## 2016-04-21 ENCOUNTER — Encounter (HOSPITAL_COMMUNITY): Payer: Self-pay | Admitting: Nephrology

## 2016-04-21 ENCOUNTER — Inpatient Hospital Stay (HOSPITAL_COMMUNITY): Payer: Medicare Other

## 2016-04-21 DIAGNOSIS — F329 Major depressive disorder, single episode, unspecified: Secondary | ICD-10-CM | POA: Diagnosis present

## 2016-04-21 DIAGNOSIS — I214 Non-ST elevation (NSTEMI) myocardial infarction: Principal | ICD-10-CM | POA: Diagnosis present

## 2016-04-21 DIAGNOSIS — R58 Hemorrhage, not elsewhere classified: Secondary | ICD-10-CM | POA: Diagnosis not present

## 2016-04-21 DIAGNOSIS — E785 Hyperlipidemia, unspecified: Secondary | ICD-10-CM | POA: Diagnosis present

## 2016-04-21 DIAGNOSIS — R4182 Altered mental status, unspecified: Secondary | ICD-10-CM

## 2016-04-21 DIAGNOSIS — J441 Chronic obstructive pulmonary disease with (acute) exacerbation: Secondary | ICD-10-CM | POA: Diagnosis present

## 2016-04-21 DIAGNOSIS — E876 Hypokalemia: Secondary | ICD-10-CM | POA: Diagnosis not present

## 2016-04-21 DIAGNOSIS — Y95 Nosocomial condition: Secondary | ICD-10-CM | POA: Diagnosis not present

## 2016-04-21 DIAGNOSIS — D696 Thrombocytopenia, unspecified: Secondary | ICD-10-CM | POA: Diagnosis not present

## 2016-04-21 DIAGNOSIS — R1310 Dysphagia, unspecified: Secondary | ICD-10-CM | POA: Diagnosis not present

## 2016-04-21 DIAGNOSIS — R0689 Other abnormalities of breathing: Secondary | ICD-10-CM | POA: Diagnosis not present

## 2016-04-21 DIAGNOSIS — J189 Pneumonia, unspecified organism: Secondary | ICD-10-CM | POA: Insufficient documentation

## 2016-04-21 DIAGNOSIS — E874 Mixed disorder of acid-base balance: Secondary | ICD-10-CM | POA: Diagnosis not present

## 2016-04-21 DIAGNOSIS — J9622 Acute and chronic respiratory failure with hypercapnia: Secondary | ICD-10-CM | POA: Diagnosis not present

## 2016-04-21 DIAGNOSIS — Z8673 Personal history of transient ischemic attack (TIA), and cerebral infarction without residual deficits: Secondary | ICD-10-CM

## 2016-04-21 DIAGNOSIS — G9340 Encephalopathy, unspecified: Secondary | ICD-10-CM | POA: Diagnosis not present

## 2016-04-21 DIAGNOSIS — Z8711 Personal history of peptic ulcer disease: Secondary | ICD-10-CM

## 2016-04-21 DIAGNOSIS — I130 Hypertensive heart and chronic kidney disease with heart failure and stage 1 through stage 4 chronic kidney disease, or unspecified chronic kidney disease: Secondary | ICD-10-CM | POA: Diagnosis present

## 2016-04-21 DIAGNOSIS — Z881 Allergy status to other antibiotic agents status: Secondary | ICD-10-CM

## 2016-04-21 DIAGNOSIS — F410 Panic disorder [episodic paroxysmal anxiety] without agoraphobia: Secondary | ICD-10-CM | POA: Diagnosis present

## 2016-04-21 DIAGNOSIS — N179 Acute kidney failure, unspecified: Secondary | ICD-10-CM | POA: Diagnosis present

## 2016-04-21 DIAGNOSIS — R079 Chest pain, unspecified: Secondary | ICD-10-CM | POA: Diagnosis present

## 2016-04-21 DIAGNOSIS — J9690 Respiratory failure, unspecified, unspecified whether with hypoxia or hypercapnia: Secondary | ICD-10-CM

## 2016-04-21 DIAGNOSIS — Z992 Dependence on renal dialysis: Secondary | ICD-10-CM

## 2016-04-21 DIAGNOSIS — I10 Essential (primary) hypertension: Secondary | ICD-10-CM | POA: Insufficient documentation

## 2016-04-21 DIAGNOSIS — Z87891 Personal history of nicotine dependence: Secondary | ICD-10-CM

## 2016-04-21 DIAGNOSIS — Z7982 Long term (current) use of aspirin: Secondary | ICD-10-CM

## 2016-04-21 DIAGNOSIS — Z79899 Other long term (current) drug therapy: Secondary | ICD-10-CM

## 2016-04-21 DIAGNOSIS — F418 Other specified anxiety disorders: Secondary | ICD-10-CM | POA: Diagnosis present

## 2016-04-21 DIAGNOSIS — R0600 Dyspnea, unspecified: Secondary | ICD-10-CM | POA: Diagnosis not present

## 2016-04-21 DIAGNOSIS — R6521 Severe sepsis with septic shock: Secondary | ICD-10-CM | POA: Diagnosis not present

## 2016-04-21 DIAGNOSIS — R062 Wheezing: Secondary | ICD-10-CM

## 2016-04-21 DIAGNOSIS — Z9841 Cataract extraction status, right eye: Secondary | ICD-10-CM | POA: Diagnosis not present

## 2016-04-21 DIAGNOSIS — I169 Hypertensive crisis, unspecified: Secondary | ICD-10-CM | POA: Diagnosis present

## 2016-04-21 DIAGNOSIS — F32A Depression, unspecified: Secondary | ICD-10-CM | POA: Diagnosis present

## 2016-04-21 DIAGNOSIS — I4891 Unspecified atrial fibrillation: Secondary | ICD-10-CM | POA: Diagnosis not present

## 2016-04-21 DIAGNOSIS — R6884 Jaw pain: Secondary | ICD-10-CM | POA: Diagnosis not present

## 2016-04-21 DIAGNOSIS — F419 Anxiety disorder, unspecified: Secondary | ICD-10-CM | POA: Insufficient documentation

## 2016-04-21 DIAGNOSIS — R0603 Acute respiratory distress: Secondary | ICD-10-CM

## 2016-04-21 DIAGNOSIS — L8990 Pressure ulcer of unspecified site, unspecified stage: Secondary | ICD-10-CM | POA: Insufficient documentation

## 2016-04-21 DIAGNOSIS — J9621 Acute and chronic respiratory failure with hypoxia: Secondary | ICD-10-CM | POA: Diagnosis not present

## 2016-04-21 DIAGNOSIS — J9600 Acute respiratory failure, unspecified whether with hypoxia or hypercapnia: Secondary | ICD-10-CM | POA: Diagnosis not present

## 2016-04-21 DIAGNOSIS — I5033 Acute on chronic diastolic (congestive) heart failure: Secondary | ICD-10-CM | POA: Diagnosis present

## 2016-04-21 DIAGNOSIS — R221 Localized swelling, mass and lump, neck: Secondary | ICD-10-CM

## 2016-04-21 DIAGNOSIS — Z452 Encounter for adjustment and management of vascular access device: Secondary | ICD-10-CM

## 2016-04-21 DIAGNOSIS — J440 Chronic obstructive pulmonary disease with acute lower respiratory infection: Secondary | ICD-10-CM | POA: Diagnosis present

## 2016-04-21 DIAGNOSIS — S1093XA Contusion of unspecified part of neck, initial encounter: Secondary | ICD-10-CM | POA: Diagnosis not present

## 2016-04-21 DIAGNOSIS — Z96611 Presence of right artificial shoulder joint: Secondary | ICD-10-CM | POA: Diagnosis present

## 2016-04-21 DIAGNOSIS — Z86711 Personal history of pulmonary embolism: Secondary | ICD-10-CM

## 2016-04-21 DIAGNOSIS — D72829 Elevated white blood cell count, unspecified: Secondary | ICD-10-CM | POA: Insufficient documentation

## 2016-04-21 DIAGNOSIS — N184 Chronic kidney disease, stage 4 (severe): Secondary | ICD-10-CM | POA: Diagnosis present

## 2016-04-21 DIAGNOSIS — J9601 Acute respiratory failure with hypoxia: Secondary | ICD-10-CM | POA: Diagnosis not present

## 2016-04-21 DIAGNOSIS — I2699 Other pulmonary embolism without acute cor pulmonale: Secondary | ICD-10-CM | POA: Diagnosis not present

## 2016-04-21 DIAGNOSIS — Z8619 Personal history of other infectious and parasitic diseases: Secondary | ICD-10-CM

## 2016-04-21 DIAGNOSIS — Z95828 Presence of other vascular implants and grafts: Secondary | ICD-10-CM

## 2016-04-21 DIAGNOSIS — I252 Old myocardial infarction: Secondary | ICD-10-CM

## 2016-04-21 DIAGNOSIS — Z978 Presence of other specified devices: Secondary | ICD-10-CM

## 2016-04-21 DIAGNOSIS — R509 Fever, unspecified: Secondary | ICD-10-CM

## 2016-04-21 DIAGNOSIS — R072 Precordial pain: Secondary | ICD-10-CM | POA: Diagnosis not present

## 2016-04-21 DIAGNOSIS — I480 Paroxysmal atrial fibrillation: Secondary | ICD-10-CM | POA: Diagnosis not present

## 2016-04-21 DIAGNOSIS — Z9842 Cataract extraction status, left eye: Secondary | ICD-10-CM

## 2016-04-21 DIAGNOSIS — M109 Gout, unspecified: Secondary | ICD-10-CM | POA: Diagnosis present

## 2016-04-21 DIAGNOSIS — A419 Sepsis, unspecified organism: Secondary | ICD-10-CM | POA: Diagnosis not present

## 2016-04-21 DIAGNOSIS — Z6841 Body Mass Index (BMI) 40.0 and over, adult: Secondary | ICD-10-CM

## 2016-04-21 DIAGNOSIS — R061 Stridor: Secondary | ICD-10-CM

## 2016-04-21 DIAGNOSIS — J9602 Acute respiratory failure with hypercapnia: Secondary | ICD-10-CM | POA: Diagnosis not present

## 2016-04-21 DIAGNOSIS — Z86718 Personal history of other venous thrombosis and embolism: Secondary | ICD-10-CM

## 2016-04-21 DIAGNOSIS — Z8249 Family history of ischemic heart disease and other diseases of the circulatory system: Secondary | ICD-10-CM

## 2016-04-21 DIAGNOSIS — I209 Angina pectoris, unspecified: Secondary | ICD-10-CM | POA: Diagnosis not present

## 2016-04-21 DIAGNOSIS — D62 Acute posthemorrhagic anemia: Secondary | ICD-10-CM | POA: Diagnosis not present

## 2016-04-21 DIAGNOSIS — I700 Atherosclerosis of aorta: Secondary | ICD-10-CM | POA: Diagnosis present

## 2016-04-21 DIAGNOSIS — I5032 Chronic diastolic (congestive) heart failure: Secondary | ICD-10-CM | POA: Diagnosis not present

## 2016-04-21 DIAGNOSIS — R0602 Shortness of breath: Secondary | ICD-10-CM

## 2016-04-21 DIAGNOSIS — Z90710 Acquired absence of both cervix and uterus: Secondary | ICD-10-CM

## 2016-04-21 DIAGNOSIS — I25119 Atherosclerotic heart disease of native coronary artery with unspecified angina pectoris: Secondary | ICD-10-CM | POA: Diagnosis present

## 2016-04-21 DIAGNOSIS — R0789 Other chest pain: Secondary | ICD-10-CM | POA: Diagnosis not present

## 2016-04-21 DIAGNOSIS — G4733 Obstructive sleep apnea (adult) (pediatric): Secondary | ICD-10-CM | POA: Diagnosis present

## 2016-04-21 DIAGNOSIS — E875 Hyperkalemia: Secondary | ICD-10-CM | POA: Diagnosis not present

## 2016-04-21 DIAGNOSIS — S1093XD Contusion of unspecified part of neck, subsequent encounter: Secondary | ICD-10-CM | POA: Diagnosis not present

## 2016-04-21 DIAGNOSIS — R739 Hyperglycemia, unspecified: Secondary | ICD-10-CM | POA: Diagnosis present

## 2016-04-21 DIAGNOSIS — M7981 Nontraumatic hematoma of soft tissue: Secondary | ICD-10-CM | POA: Diagnosis not present

## 2016-04-21 DIAGNOSIS — R4020 Unspecified coma: Secondary | ICD-10-CM | POA: Diagnosis not present

## 2016-04-21 DIAGNOSIS — Z9289 Personal history of other medical treatment: Secondary | ICD-10-CM

## 2016-04-21 DIAGNOSIS — K219 Gastro-esophageal reflux disease without esophagitis: Secondary | ICD-10-CM | POA: Diagnosis present

## 2016-04-21 DIAGNOSIS — T380X5A Adverse effect of glucocorticoids and synthetic analogues, initial encounter: Secondary | ICD-10-CM | POA: Diagnosis present

## 2016-04-21 LAB — BASIC METABOLIC PANEL
ANION GAP: 14 (ref 5–15)
BUN: 39 mg/dL — AB (ref 6–20)
CALCIUM: 8.3 mg/dL — AB (ref 8.9–10.3)
CO2: 29 mmol/L (ref 22–32)
Chloride: 99 mmol/L — ABNORMAL LOW (ref 101–111)
Creatinine, Ser: 2.16 mg/dL — ABNORMAL HIGH (ref 0.44–1.00)
GFR calc Af Amer: 25 mL/min — ABNORMAL LOW (ref >60)
GFR, EST NON AFRICAN AMERICAN: 21 mL/min — AB (ref >60)
GLUCOSE: 157 mg/dL — AB (ref 65–99)
POTASSIUM: 3.7 mmol/L (ref 3.5–5.1)
SODIUM: 142 mmol/L (ref 135–145)

## 2016-04-21 LAB — PROTIME-INR
INR: 1.17 (ref 0.00–1.49)
PROTHROMBIN TIME: 15.1 s (ref 11.6–15.2)

## 2016-04-21 LAB — TROPONIN I
TROPONIN I: 7.66 ng/mL — AB (ref <0.031)
TROPONIN I: 5.91 ng/mL — AB (ref <0.031)
Troponin I: 5.39 ng/mL (ref <0.031)

## 2016-04-21 LAB — CBC
HCT: 37 % (ref 36.0–46.0)
Hemoglobin: 11.8 g/dL — ABNORMAL LOW (ref 12.0–15.0)
MCH: 32.4 pg (ref 26.0–34.0)
MCHC: 31.9 g/dL (ref 30.0–36.0)
MCV: 101.6 fL — ABNORMAL HIGH (ref 78.0–100.0)
PLATELETS: 151 10*3/uL (ref 150–400)
RBC: 3.64 MIL/uL — ABNORMAL LOW (ref 3.87–5.11)
RDW: 14.9 % (ref 11.5–15.5)
WBC: 14.9 10*3/uL — ABNORMAL HIGH (ref 4.0–10.5)

## 2016-04-21 LAB — ECHOCARDIOGRAM COMPLETE
Height: 63 in
Weight: 3679.04 oz

## 2016-04-21 LAB — GLUCOSE, CAPILLARY
GLUCOSE-CAPILLARY: 153 mg/dL — AB (ref 65–99)
GLUCOSE-CAPILLARY: 186 mg/dL — AB (ref 65–99)
Glucose-Capillary: 158 mg/dL — ABNORMAL HIGH (ref 65–99)
Glucose-Capillary: 138 mg/dL — ABNORMAL HIGH (ref 65–99)
Glucose-Capillary: 194 mg/dL — ABNORMAL HIGH (ref 65–99)

## 2016-04-21 LAB — HEPARIN LEVEL (UNFRACTIONATED)
HEPARIN UNFRACTIONATED: 1.19 [IU]/mL — AB (ref 0.30–0.70)
Heparin Unfractionated: 0.62 IU/mL (ref 0.30–0.70)

## 2016-04-21 LAB — MRSA PCR SCREENING: MRSA BY PCR: NEGATIVE

## 2016-04-21 MED ORDER — HEPARIN (PORCINE) IN NACL 100-0.45 UNIT/ML-% IJ SOLN
1000.0000 [IU]/h | INTRAMUSCULAR | Status: DC
  Administered 2016-04-21: 1000 [IU]/h via INTRAVENOUS

## 2016-04-21 MED ORDER — ISOSORBIDE MONONITRATE ER 30 MG PO TB24
30.0000 mg | ORAL_TABLET | Freq: Every day | ORAL | Status: DC
  Administered 2016-04-21: 30 mg via ORAL
  Filled 2016-04-21: qty 1

## 2016-04-21 MED ORDER — SPIRONOLACTONE 25 MG PO TABS
25.0000 mg | ORAL_TABLET | Freq: Every day | ORAL | Status: DC
  Administered 2016-04-21 – 2016-04-22 (×2): 25 mg via ORAL
  Filled 2016-04-21 (×2): qty 1

## 2016-04-21 MED ORDER — HYDRALAZINE HCL 50 MG PO TABS
50.0000 mg | ORAL_TABLET | Freq: Three times a day (TID) | ORAL | Status: DC
  Administered 2016-04-21 – 2016-04-23 (×7): 50 mg via ORAL
  Filled 2016-04-21 (×7): qty 1

## 2016-04-21 MED ORDER — METHYLPREDNISOLONE SODIUM SUCC 125 MG IJ SOLR
60.0000 mg | Freq: Two times a day (BID) | INTRAMUSCULAR | Status: DC
  Administered 2016-04-21: 60 mg via INTRAVENOUS
  Filled 2016-04-21: qty 2

## 2016-04-21 MED ORDER — METHYLPREDNISOLONE SODIUM SUCC 125 MG IJ SOLR
60.0000 mg | Freq: Four times a day (QID) | INTRAMUSCULAR | Status: DC
  Administered 2016-04-21 – 2016-04-23 (×8): 60 mg via INTRAVENOUS
  Filled 2016-04-21 (×8): qty 2

## 2016-04-21 MED ORDER — ALBUTEROL SULFATE (2.5 MG/3ML) 0.083% IN NEBU
2.5000 mg | INHALATION_SOLUTION | RESPIRATORY_TRACT | Status: DC | PRN
  Administered 2016-04-27 – 2016-05-04 (×4): 2.5 mg via RESPIRATORY_TRACT
  Filled 2016-04-21 (×4): qty 3

## 2016-04-21 MED ORDER — ASPIRIN EC 81 MG PO TBEC
81.0000 mg | DELAYED_RELEASE_TABLET | Freq: Every day | ORAL | Status: DC
  Administered 2016-04-21 – 2016-04-22 (×2): 81 mg via ORAL
  Filled 2016-04-21 (×2): qty 1

## 2016-04-21 MED ORDER — ACETAMINOPHEN 650 MG RE SUPP
650.0000 mg | Freq: Four times a day (QID) | RECTAL | Status: DC | PRN
  Administered 2016-04-23: 650 mg via RECTAL
  Filled 2016-04-21: qty 1

## 2016-04-21 MED ORDER — BISOPROLOL FUMARATE 5 MG PO TABS
5.0000 mg | ORAL_TABLET | Freq: Every day | ORAL | Status: DC
  Administered 2016-04-21: 5 mg via ORAL
  Filled 2016-04-21: qty 1

## 2016-04-21 MED ORDER — HYDROCOD POLST-CPM POLST ER 10-8 MG/5ML PO SUER: 5.0000 mL | Freq: Two times a day (BID) | ORAL | Status: DC | PRN

## 2016-04-21 MED ORDER — POLYETHYLENE GLYCOL 3350 17 G PO PACK
17.0000 g | PACK | Freq: Every day | ORAL | Status: DC | PRN
  Filled 2016-04-21: qty 1

## 2016-04-21 MED ORDER — IPRATROPIUM-ALBUTEROL 0.5-2.5 (3) MG/3ML IN SOLN
3.0000 mL | Freq: Four times a day (QID) | RESPIRATORY_TRACT | Status: DC
  Administered 2016-04-21 – 2016-04-22 (×7): 3 mL via RESPIRATORY_TRACT
  Filled 2016-04-21 (×7): qty 3

## 2016-04-21 MED ORDER — AMLODIPINE BESYLATE 5 MG PO TABS
5.0000 mg | ORAL_TABLET | Freq: Every day | ORAL | Status: DC
  Administered 2016-04-21 – 2016-04-22 (×2): 5 mg via ORAL
  Filled 2016-04-21 (×2): qty 1

## 2016-04-21 MED ORDER — CALCITRIOL 0.25 MCG PO CAPS
0.2500 ug | ORAL_CAPSULE | Freq: Every day | ORAL | Status: DC
  Administered 2016-04-21 – 2016-04-22 (×2): 0.25 ug via ORAL
  Filled 2016-04-21 (×2): qty 1

## 2016-04-21 MED ORDER — LABETALOL HCL 300 MG PO TABS
300.0000 mg | ORAL_TABLET | Freq: Two times a day (BID) | ORAL | Status: DC
  Administered 2016-04-21 – 2016-04-22 (×5): 300 mg via ORAL
  Filled 2016-04-21 (×5): qty 1

## 2016-04-21 MED ORDER — INSULIN ASPART 100 UNIT/ML ~~LOC~~ SOLN
0.0000 [IU] | Freq: Three times a day (TID) | SUBCUTANEOUS | Status: DC
  Administered 2016-04-21 (×2): 3 [IU] via SUBCUTANEOUS
  Administered 2016-04-21: 2 [IU] via SUBCUTANEOUS
  Administered 2016-04-22: 3 [IU] via SUBCUTANEOUS
  Administered 2016-04-22: 5 [IU] via SUBCUTANEOUS
  Administered 2016-04-22: 3 [IU] via SUBCUTANEOUS

## 2016-04-21 MED ORDER — IPRATROPIUM-ALBUTEROL 0.5-2.5 (3) MG/3ML IN SOLN: 3.0000 mL | Freq: Four times a day (QID) | RESPIRATORY_TRACT | Status: DC

## 2016-04-21 MED ORDER — MONTELUKAST SODIUM 10 MG PO TABS
10.0000 mg | ORAL_TABLET | Freq: Every day | ORAL | Status: DC
  Administered 2016-04-21 – 2016-04-22 (×2): 10 mg via ORAL
  Filled 2016-04-21 (×2): qty 1

## 2016-04-21 MED ORDER — BISACODYL 10 MG RE SUPP
10.0000 mg | Freq: Every day | RECTAL | Status: DC | PRN
Start: 2016-04-21 — End: 2016-05-13

## 2016-04-21 MED ORDER — ONDANSETRON HCL 4 MG/2ML IJ SOLN
4.0000 mg | Freq: Four times a day (QID) | INTRAMUSCULAR | Status: DC | PRN
  Administered 2016-04-30: 4 mg via INTRAVENOUS
  Filled 2016-04-21: qty 2

## 2016-04-21 MED ORDER — FUROSEMIDE 10 MG/ML IJ SOLN
60.0000 mg | Freq: Two times a day (BID) | INTRAMUSCULAR | Status: DC
  Administered 2016-04-21 – 2016-04-22 (×3): 60 mg via INTRAVENOUS
  Filled 2016-04-21 (×3): qty 6

## 2016-04-21 MED ORDER — DOXAZOSIN MESYLATE 4 MG PO TABS
4.0000 mg | ORAL_TABLET | Freq: Every day | ORAL | Status: DC
  Administered 2016-04-21 – 2016-04-22 (×2): 4 mg via ORAL
  Filled 2016-04-21 (×2): qty 1

## 2016-04-21 MED ORDER — INSULIN ASPART 100 UNIT/ML ~~LOC~~ SOLN: 0.0000 [IU] | Freq: Every day | SUBCUTANEOUS | Status: DC

## 2016-04-21 MED ORDER — ATORVASTATIN CALCIUM 80 MG PO TABS
80.0000 mg | ORAL_TABLET | Freq: Every day | ORAL | Status: DC
  Administered 2016-04-21 – 2016-04-22 (×2): 80 mg via ORAL
  Filled 2016-04-21 (×2): qty 1

## 2016-04-21 MED ORDER — NITROGLYCERIN IN D5W 200-5 MCG/ML-% IV SOLN
0.0000 ug/min | INTRAVENOUS | Status: DC
  Administered 2016-04-21 – 2016-04-22 (×3): 50 ug/min via INTRAVENOUS
  Filled 2016-04-21 (×3): qty 250

## 2016-04-21 MED ORDER — TRAZODONE HCL 50 MG PO TABS
50.0000 mg | ORAL_TABLET | Freq: Every day | ORAL | Status: DC
  Administered 2016-04-21 – 2016-04-22 (×2): 50 mg via ORAL
  Filled 2016-04-21 (×2): qty 1

## 2016-04-21 MED ORDER — HEPARIN (PORCINE) IN NACL 100-0.45 UNIT/ML-% IJ SOLN
650.0000 [IU]/h | INTRAMUSCULAR | Status: DC
  Administered 2016-04-22: 800 [IU]/h via INTRAVENOUS
  Filled 2016-04-21: qty 250

## 2016-04-21 MED ORDER — ONDANSETRON HCL 4 MG PO TABS: 4.0000 mg | ORAL_TABLET | Freq: Four times a day (QID) | ORAL | Status: DC | PRN

## 2016-04-21 MED ORDER — ACETAMINOPHEN 325 MG PO TABS: 650.0000 mg | ORAL_TABLET | Freq: Four times a day (QID) | ORAL | Status: DC | PRN

## 2016-04-21 MED ORDER — ALPRAZOLAM 0.5 MG PO TABS
0.5000 mg | ORAL_TABLET | Freq: Three times a day (TID) | ORAL | Status: DC | PRN
  Administered 2016-04-21 – 2016-04-22 (×2): 0.5 mg via ORAL
  Filled 2016-04-21 (×2): qty 1

## 2016-04-21 NOTE — Progress Notes (Signed)
ANTICOAGULATION CONSULT NOTE - Initial Consult  Pharmacy Consult for heparin Indication: chest pain/ACS  Allergies  Allergen Reactions  . Ciprofloxacin     rash    Patient Measurements: Height: 5\' 3"  (160 cm) Weight: 231 lb 11.3 oz (105.1 kg) IBW/kg (Calculated) : 52.4 Heparin Dosing Weight: 80kg  Vital Signs: Temp: 97.7 F (36.5 C) (05/19 0055) Temp Source: Oral (05/19 0055) BP: 159/74 mmHg (05/19 0055)   Medical History: Past Medical History  Diagnosis Date  . Hypertension   . History of blood clots     Right leg & lung  . High cholesterol   . COPD (chronic obstructive pulmonary disease) (HCC)   . Asthma   . GERD (gastroesophageal reflux disease)   . PONV (postoperative nausea and vomiting)   . MI (myocardial infarction) (HCC)     X 2  . Heart murmur   . Shortness of breath   . Stroke (HCC)   . Anxiety   . Depression   . Difficulty swallowing solids   . Stomach ulcer   . Gout   . History of shingles   . Rash     MD aware and pt was given Monistat to use; on stomach, neck (hairline)  . Bruises easily   . Renal disorder     Assessment: 75yo female tx'd from [facility] for further evaluation of acute on chronic respiratory failure w/ concern for aortic dissection 2/2 severe CP, started on heparin at OSH.  Goal of Therapy:  Heparin level 0.3-0.7 units/ml Monitor platelets by anticoagulation protocol: Yes   Plan:  RN reports that pt has been on heparin 1000 units/hr and that OSH gave in report that pt was due for labs after MN; will continue for now and monitor heparin levels and CBC.  Vernard Gambles, PharmD, BCPS  04/26/2016,3:28 AM

## 2016-04-21 NOTE — Progress Notes (Signed)
Spoke with Primary RN regarding PICC insertion and pt being seen by renal MDs.    She is going to notify renal MD for further instructions on inserting PICC or going to IR for an IJ central line.

## 2016-04-21 NOTE — Progress Notes (Signed)
ANTICOAGULATION CONSULT NOTE - Follow Up Consult  Pharmacy Consult for heparin Indication: chest pain/ACS   Labs:  Recent Labs  05/18/16 0520 2016-05-18 0521  HGB 11.8*  --   HCT 37.0  --   PLT 151  --   HEPARINUNFRC  --  1.19*  CREATININE 2.16*  --      Assessment: 75yo female above goal on heparin with initial dosing for CP.  Goal of Therapy:  Heparin level 0.3-0.7 units/ml   Plan:  Will hold heparin gtt x1hr then decrease heparin gtt by ~3 units/kgABW/hr to 800 units/hr and check level in 8hr.  Vernard Gambles, PharmD, BCPS  05-18-16,6:40 AM

## 2016-04-21 NOTE — Progress Notes (Signed)
Subjective: Patient admitted this morning, see detailed H&p by Dr Arlean Hopping. 75 y.o. female with history of COPD, CKD, diast CHF, angina/ MI x 2 (no hx cath) presented to [facility] on 5/16 with SOB and acute/ chron respiratory failure. Treated for acute exacerbation of COPD (chronic / recurrent issue). BP's was high w SBP's in the 240's according to the patient, and on 5/17-18 she developed severe chest pain w radiation into bilat shoulders / jaw. Trop was up at 1.04. EKG showed some anterolateral ST depression, not severe. They were concerned about aortic dissection but couldn't give dye due to CKD and didn't have MRA so pt was transferred to [facility].  On arrival pt says here CP is much better and her shoulders aren't hurting anymore. She arrived on IV NTG and IV heparin. BP is good at 156/ 95.   This morning, troponin was elevated to 7.66. Chest pain has eased off. Filed Vitals:   04-30-16 1115 Apr 30, 2016 1329  BP: 137/54   Pulse: 90 90  Temp: 97.7 F (36.5 C)   Resp: 19 19    Chest: Clear Bilaterally Heart : S1S2 RRR Abdomen: Soft, nontender Ext : No edema Neuro: Alert, oriented x 3  A/P Nonstemi Hypertensive urgency CKD stage IV COPD  Continue IV nitroglycerin, IV heparin. Will consult cardiology     Meredeth Ide [facility] Pager- [phone]

## 2016-04-21 NOTE — Consult Note (Signed)
Reason for Consult: elevated troponin  Referring Physician: Dr. Darrick Meigs   PCP:  Charletta Cousin, MD  Primary Cardiologist:Dr. Jess Barters [name] [name] is an 75 y.o. female.    Chief Complaint: admitted 04/11/2016 with chest pain, though original admit 04/18/16 at [facility] with acute exacerbation COPD.      HPI: Asked to see 75 year old female for increasing troponin.  She has a hx. 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 Despite stopping Lasix and ACE I her renal function progressively worsened to a peak of ~7 (baseline of 1.2-2.5), emergent HD was initiated. Cardiac enzymes were elevated in the absence of EKG changes. ECHO was WNL except for grade 1 diastolic dysfunction.  07/11/14 previous stress test: No evidence for pharmacological induced ischemia. Calculated ejection fraction is 64% no hx of Cardiac cath due to Renal function.   Admitted to [facility]  04/18/16 for acute COPD exacerbation.  Was treated and BP systolic was elevated to 811.  Her BP per pt was at this level for a day or 2.   By the 17-18th she developed chest pain with radiation to bil shoulders and jaw with Troponin 1.04.   She was placed on IV NTG and Heparin.  EKG on the 18th with more lateral ST depression.  She believes this is her 3rd NSTEMI.  2-3 weeks prior to admit she was taking NTG for bil jaw pain and then chest pain.  2 NTG would usually take care of the pain.     Currently troponin 7.66 Cr is 2.16  EKG today  SR  With deeper T wave inversion in I, II, AVL,  new Q waves in III, lateral depression mot as significant today.  Still with wheezes. No chest or jaw pain currently.   Past Medical History  Diagnosis Date  . Hypertension   . History of blood clots    Right leg & lung  . High cholesterol   . COPD (chronic obstructive pulmonary disease) ([place])   . Asthma   . GERD (gastroesophageal reflux disease)   . PONV (postoperative nausea and vomiting)   . MI (myocardial infarction) ([place])     X 2  . Heart murmur   . Shortness of breath   . Stroke ([place])   . Anxiety   . Depression   . Difficulty swallowing solids   . Stomach ulcer   . Gout   . History of shingles   . Rash     MD aware and pt was given Monistat to use; on stomach, neck (hairline)  . Bruises easily   . Renal disorder     Past Surgical History  Procedure Laterality Date  . Abdominal hysterectomy    . Breast surgery Left   . Appendectomy    . Colonoscopy w/ polypectomy    . Upper gi endoscopy    . Esophagus surgery      Esophagus stretched during endoscopy  . Ivc filter    . Cataract extraction, bilateral Bilateral   . Shoulder hemi-arthroplasty Right 04/16/2015    Procedure: RIGHT SHOULDER HEMI-ARTHROPLASTY WITH BIOPSY;  Surgeon: Netta Cedars, MD;  Location: [place];  Service: Orthopedics;  Laterality: Right;    Family History  Problem Relation Age of Onset  .  Heart attack Father     in his 5s  . Congestive Heart Failure Mother    Social History:  reports that she quit smoking about 3 years ago. Her smoking use included Cigarettes. She has a 32.5 pack-year smoking history. She does not have any smokeless tobacco history on file. She reports that she does not drink alcohol or use illicit drugs.  Allergies:  Allergies  Allergen Reactions  . Ciprofloxacin     rash    OUTPATIENT MEDICATIONS: No current facility-administered medications on file prior to encounter.   Current Outpatient Prescriptions on File Prior to Encounter  Medication Sig Dispense Refill  . aspirin EC 81 MG EC tablet Take 1 tablet (81 mg total) by mouth daily.    Marland Kitchen atorvastatin (LIPITOR) 40 MG tablet Take 40 mg by mouth daily at 6 PM.     . bisoprolol (ZEBETA) 10 MG tablet Take one tablet daily   2  . BREO ELLIPTA 200-25 MCG/INH AEPB Inhale 1 puff into the lungs daily.     . calcitRIOL (ROCALTROL) 0.25 MCG capsule Take 2 capsules by mouth daily.     Marland Kitchen doxazosin (CARDURA) 4 MG tablet Take 1 tablet by mouth daily.    Marland Kitchen escitalopram (LEXAPRO) 20 MG tablet TAKE 1 TABLET(20 MG) BY MOUTH DAILY 90 tablet 0  . furosemide (LASIX) 80 MG tablet Take 80 mg by mouth daily.     . isosorbide mononitrate (IMDUR) 30 MG 24 hr tablet Take 3 tablets (90 mg total) by mouth daily. 90 tablet 3  . montelukast (SINGULAIR) 10 MG tablet Take 10 mg by mouth at bedtime.    Marland Kitchen omeprazole (PRILOSEC) 20 MG capsule Take 20 mg by mouth daily.    Marland Kitchen spironolactone (ALDACTONE) 25 MG tablet Take 25 mg by mouth daily.     INPATIENT MEDICATIONS: Scheduled Meds: . aspirin EC  81 mg Oral Daily  . bisoprolol  5 mg Oral Daily  . calcitRIOL  0.25 mcg Oral Daily  . doxazosin  4 mg Oral QHS  . furosemide  60 mg Intravenous Q12H  . hydrALAZINE  50 mg Oral Q8H  . insulin aspart  0-15 Units Subcutaneous TID WC  . insulin aspart  0-5 Units Subcutaneous QHS  . ipratropium-albuterol  3 mL Nebulization Q6H  . isosorbide mononitrate  30 mg Oral Daily  . labetalol  300 mg Oral BID  . methylPREDNISolone (SOLU-MEDROL) injection  60 mg Intravenous Q6H  . montelukast  10 mg Oral QHS  . spironolactone  25 mg Oral Daily  . traZODone  50 mg Oral QHS   Continuous Infusions: . heparin 800 Units/hr (04/14/2016 0737)  . nitroGLYCERIN 50 mcg/min (04/19/2016 0741)   PRN Meds:.acetaminophen **OR** acetaminophen, albuterol, ALPRAZolam, bisacodyl, chlorpheniramine-HYDROcodone, ondansetron **OR** ondansetron (ZOFRAN) IV, polyethylene glycol   Results for orders placed or performed during the hospital encounter of 04/11/2016 (from the past 48 hour(s))  Glucose, capillary     Status: Abnormal   Collection Time: 04/22/2016 12:52 AM  Result Value Ref Range   Glucose-Capillary 153 (H) 65 - 99 mg/dL  MRSA PCR Screening     Status: None   Collection  Time: 04/27/2016  1:52 AM  Result Value Ref Range   MRSA by PCR NEGATIVE NEGATIVE    Comment:        The GeneXpert MRSA Assay (FDA approved for NASAL specimens only), is one component of a comprehensive MRSA colonization surveillance program. It is not intended to diagnose MRSA infection nor to guide or monitor  treatment for MRSA infections.   Basic metabolic panel     Status: Abnormal   Collection Time: 04/04/2016  5:20 AM  Result Value Ref Range   Sodium 142 135 - 145 mmol/L   Potassium 3.7 3.5 - 5.1 mmol/L   Chloride 99 (L) 101 - 111 mmol/L   CO2 29 22 - 32 mmol/L   Glucose, Bld 157 (H) 65 - 99 mg/dL   BUN 39 (H) 6 - 20 mg/dL   Creatinine, Ser 2.16 (H) 0.44 - 1.00 mg/dL   Calcium 8.3 (L) 8.9 - 10.3 mg/dL   GFR calc non Af Amer 21 (L) >60 mL/min   GFR calc Af Amer 25 (L) >60 mL/min    Comment: (NOTE) The eGFR has been calculated using the CKD EPI equation. This calculation has not been validated in all clinical situations. eGFR's persistently <60 mL/min signify possible Chronic Kidney Disease.    Anion gap 14 5 - 15  CBC     Status: Abnormal   Collection Time: 04/25/2016  5:20 AM  Result Value Ref Range   WBC 14.9 (H) 4.0 - 10.5 K/uL   RBC 3.64 (L) 3.87 - 5.11 MIL/uL   Hemoglobin 11.8 (L) 12.0 - 15.0 g/dL   HCT 37.0 36.0 - 46.0 %   MCV 101.6 (H) 78.0 - 100.0 fL   MCH 32.4 26.0 - 34.0 pg   MCHC 31.9 30.0 - 36.0 g/dL   RDW 14.9 11.5 - 15.5 %   Platelets 151 150 - 400 K/uL  Heparin level (unfractionated)     Status: Abnormal   Collection Time: 04/09/2016  5:21 AM  Result Value Ref Range   Heparin Unfractionated 1.19 (H) 0.30 - 0.70 IU/mL    Comment:        IF HEPARIN RESULTS ARE BELOW EXPECTED VALUES, AND PATIENT DOSAGE HAS BEEN CONFIRMED, SUGGEST FOLLOW UP TESTING OF ANTITHROMBIN III LEVELS. RESULTS CONFIRMED BY MANUAL DILUTION   Glucose, capillary     Status: Abnormal   Collection Time: 04/05/2016  8:12 AM  Result Value Ref Range   Glucose-Capillary 158 (H) 65 - 99  mg/dL  Troponin I (q 6hr x 3)     Status: Abnormal   Collection Time: 04/03/2016  8:13 AM  Result Value Ref Range   Troponin I 7.66 (HH) <0.031 ng/mL    Comment:        POSSIBLE MYOCARDIAL ISCHEMIA. SERIAL TESTING RECOMMENDED. CRITICAL RESULT CALLED TO, READ BACK BY AND VERIFIED WITH: B.TILLMAN,RN 1005 04/27/2016 CLARK,S    No results found.  ROS: General:no colds or fevers, + weight increase from 01/05/16  Skin:no rashes or ulcers, + bruising HEENT:no blurred vision, no congestion CV:see HPI PUL:see HPI GI:no diarrhea constipation or melena, no indigestion GU:no hematuria, no dysuria MS:no joint pain, no claudication Neuro:no syncope, no lightheadedness Endo:no diabetes, no thyroid disease   Blood pressure 137/54, pulse 90, temperature 97.7 F (36.5 C), temperature source Oral, resp. rate 19, height _0  (1.6 m), weight 229 lb 15 oz (104.3 kg), SpO2 95 %.  Wt Readings from Last 3 Encounters:  04/12/2016 229 lb 15 oz (104.3 kg)  01/05/16 210 lb (95.255 kg)  10/25/15 215 lb 12.8 oz (97.886 kg)    PE: General:Pleasant affect, NAD, sitting in the  Skin:Warm and dry, brisk capillary refill, large bruising on Lt arm, other areas of bruising on Rt arm. HEENT:normocephalic, sclera clear, mucus membranes moist Neck:supple, no JVD, no bruits  Heart:S1S2 RRR without murmur, gallup, rub or click- but muffled due  to wheezes Lungs:without rales,- rhonchi, + wheezes FTD:DUKGU, soft, non tender, + BS, do not palpate liver spleen or masses Ext:tr to 1+ lower ext edema, 2+ pedal pulses, 2+ radial pulses Neuro:alert and oriented X 3, MAE, follows commands, + facial symmetry    Assessment/Plan Principal Problem:   Chest pain Active Problems:   COPD with acute exacerbation (HCC)   Hypertensive crisis   S/P IVC filter   NSTEMI (non-ST elevated myocardial infarction) (HCC)   Chronic kidney disease (CKD), stage IV (severe) (HCC)   Depression   Chronic diastolic HF (heart failure), NYHA class  2 (HCC)  NSTEMI  May have been due to HTN but she was with jaw pain and some chest pain 2-3 weeks prior to admit. Concerning for CAD.  Will check echo, if stable would continue medical therapy.  Would prefer to do cath but with elevated Cr. She may develop ESRF and need dialysis.  Dr. Ellyn Hack to see.  Currently on heparin and NTG she is stable.  She is on ASA 81, zebeta 5 mg daily, with her bronchospasm would leave there.  Is also on imdur   On Zebeta and labetalol    -- on CT of chest in [place] + aortic calcification and calcification in the Lt Main, LAD, and LCX.  Multi vessel CAD  COPD still with wheezes. On solumedrol   Acute on chronic diastolic HF in [place] improved, on spironolactone, on lasix 40 mg IV BID  HTN improved with meds and IV NTG. On hydralazine, Imdur, NTG, labetalol, zebeta, cardura   CKD stage IV  Will stop imdur and zebeta.  Add amlodipine.       [name] [name]  Nurse Practitioner Certified [place] Pager [phone] or after 5pm or weekends call [phone] 04/04/2016, 11:43 AM  I have seen, examined and evaluated the patient this AM along with Ms. Ingold, NP-C.  After reviewing all the available data and chart, we discussed the patients laboratory, study & physical findings as well as symptoms in detail. I agree with her findings, examination as well as impression recommendations as per our discussion.    A complicated situation of a 75 year old woman with multiple cardiac risk factors as well as significant COPD and chronic diastolic heart failure and chronic renal insufficiency who was transferred from [facility] last night with positive troponins, worsening renal function, worsening diastolic heart failure COPD exacerbation. She apparently initially when it was COPD exacerbation and then became profoundly hypertensive with hypertensive emergency.  She has had a CT scan of the chest suggests multivessel coronary calcification, but  has never had any invasive cardiac evaluations - mostly related to her CK D.  She now presents with a troponin of 7.66. She is currently angina free. She does have some subtle EKG changes as described. On exam she has mild edema and diffuse wheezing with rhonchi in the lungs bilaterally. Ecchymosis throughout. He describes some tenderness in the chest and left shoulder but not chest tightness and pressure as well as jaw pain that she had in the last couple days.  I would prefer to stabilize her more from a renal and pulmonary standpoint as well as heart failure standpoint prior to considering cardiac catheterization. I think we need to make sure that her kidney function is along the right trajectory given her recent hypertensive urgency.  For now I would continue nitroglycerin drip along with heparin drip. Would consult a beta blocker to labetalol from 70 and stop Imdur.  She continues  to be on IV Lasix with by mouth spironolactone. Need to monitor renal function while she is on spironolactone Not on ACE inhibitor/ARB because of worsening renal function. She is on hydralazine .  She continues to be hypertensive. We will add amlodipine for additional blood pressure control as well as antianginal effect. Based on her CK D-IV, persistent COPD and CHF symptoms, plan is to continue diuresis and treat COPD therapy course the weekend. However would have low threshold for considering more urgent catheterization based on positive troponin levels.  Would prefer to consider delaying catheterization until early next week allowing me to reassess renal function over the course the weekend. Would probably do a staged diagnostic And intervention if necessary. My major concern is that she may have multivessel disease that would potentially mean that she would warrant CABG. This would be very difficult situation as she may not be able to successfully go through CABG without any up on dialysis.   We will continue to  follow patient. If she does have recurrence of anginal symptoms, please let us know because we would then want to consider the possibility of more urgent catheterization over the weekend.    Glenetta Hew, M.D., M.S. Interventional Cardiologist   Pager # [phone] Phone # [phone] [address] [place], [place] [address]

## 2016-04-21 NOTE — Progress Notes (Signed)
Data: patient arrived via [facility] on stretcher at ~0035. Patient alert and oriented x4, denied c/o of pain of SOB. Patient had nitrogen and heparin infusion running to a RUE Piv. Site without signs of compromise. O2 via [place] at 2 L. See echarting for additional data.  Action: oriented patient to room and floor. Ensured needed items within reach, elevated HOB to allow for maximum lung expansion, ensured patency of pt PIV, ensured call bell active.  Response: pt acknowledged understanding of use of call bell. Pt denied any other additional needs.

## 2016-04-21 NOTE — Progress Notes (Signed)
PHARMACIST - PHYSICIAN COMMUNICATION   DESCRIPTION: Pt is on bisoprolol at home, labetalol ordered for hypertensive crisis, which is duplication of therapy.  Consider d/c'ing one beta blocker when appropriate.  Vernard Gambles, PharmD, BCPS 04/26/2016 3:27 AM

## 2016-04-21 NOTE — Progress Notes (Signed)
  Echocardiogram 2D Echocardiogram has been performed.  Delcie Roch 04/14/2016, 4:17 PM

## 2016-04-21 NOTE — Progress Notes (Signed)
Patient has loss of IV access and unable to get nitro gtt or heparin gtt. MD notified. Plan is for pt to get a central line.

## 2016-04-21 NOTE — Progress Notes (Signed)
Have scheduled cardiac cath for Monday at noon, if MD agrees on rounds.  scheduled for Dr. Herbie Baltimore.  No orders yet will depend on weekend rounds.

## 2016-04-21 NOTE — Progress Notes (Signed)
CRITICAL VALUE ALERT  Critical value received:  Troponin  Date of notification:  2016-05-09  Time of notification:  10:05  Critical value read back:Yes.    Nurse who received alert:  Sherene Sires, RN  MD notified (1st page):  Dr. Sharl Ma  Time of first page:  10:06  Responding MD:  Dr. Sharl Ma  Time MD responded:  10:11

## 2016-04-21 NOTE — H&P (Signed)
[facility] History and Physical  Mayci Purohit UUV:[id number] DOB: 02-13-1941 DOA: 04/19/2016  Referring physician: not sure PCP: Charletta Cousin, MD   Chief Complaint: Chest pain  HPI: [name] [name] is a 75 y.o. female with history of COPD, CKD, diast CHF, angina/ MI x 2 (no hx cath) presented to [facility] on 5/16 with SOB and acute/ chron respiratory failure. Treated for acute exacerbation of COPD (chronic / recurrent issue).  BP's was high w SBP's in the 240's according to the patient, and on 5/17-18 she developed severe chest pain w radiation into bilat shoulders / jaw.  Trop was up at 1.04.  EKG showed some anterolateral ST depression, not severe. They were concerned about aortic dissection but couldn't give dye due to CKD and didn't have MRA so pt was transferred to [facility].    On arrival pt says here CP is much better and her shoulders aren't hurting anymore.  She arrived on IV NTG and IV heparin. BP is good at 156/ 95.    Patient says she has kidney failure f/b Dr Mercy Moore.  She has had COPD issues for several yrs and has taken a lot of prednisone.  Had a light stroke 2 yrs ago.  Has chronic DOE from the COPD.  She lives alone, has one daughter 59 yrs old, one sister and 2 brothers.  Her sister is her closest family member.  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  She is divorced from her first husband about 30 yrs ago.  She was engaged and then her fiance killed himself about 4 yrs ago.    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  Now she says she is "too tired to do anything".  She used to like to play a lot of golf.    ROS  denies CP  no joint pain   no HA  no blurry vision  no rash  no diarrhea  no nausea/ vomiting  no dysuria  no difficulty voiding  no change in urine color    Where does patient live home in [place] Can patient participate in ADLs? yes  Past Medical History  Past Medical History   Diagnosis Date  . Hypertension   . History of blood clots     Right leg & lung  . High cholesterol   . COPD (chronic obstructive pulmonary disease) ([place])   . Asthma   . GERD (gastroesophageal reflux disease)   . PONV (postoperative nausea and vomiting)   . MI (myocardial infarction) ([place])     X 2  . Heart murmur   . Shortness of breath   . Stroke ([place])   . Anxiety   . Depression   . Difficulty swallowing solids   . Stomach ulcer   . Gout   . History of shingles   . Rash     MD aware and pt was given Monistat to use; on stomach, neck (hairline)  . Bruises easily   . Renal disorder    Past Surgical History  Past Surgical History  Procedure Laterality Date  . Abdominal hysterectomy    . Breast surgery Left   . Appendectomy    . Colonoscopy w/ polypectomy    . Upper gi endoscopy    . Esophagus surgery      Esophagus stretched during endoscopy  . Ivc filter    . Cataract extraction, bilateral Bilateral   .  Shoulder hemi-arthroplasty Right 04/16/2015    Procedure: RIGHT SHOULDER HEMI-ARTHROPLASTY WITH BIOPSY;  Surgeon: Netta Cedars, MD;  Location: [place];  Service: Orthopedics;  Laterality: Right;   Family History  Family History  Problem Relation Age of Onset  . Heart attack Father     in his 61s  . Congestive Heart Failure Mother    Social History  reports that she quit smoking about 3 years ago. Her smoking use included Cigarettes. She has a 32.5 pack-year smoking history. She does not have any smokeless tobacco history on file. She reports that she does not drink alcohol or use illicit drugs. Allergies  Allergies  Allergen Reactions  . Ciprofloxacin     rash   Home medications Prior to Admission medications   Medication Sig Start Date End Date Taking? Authorizing Provider  amLODipine (NORVASC) 10 MG tablet Take 1 tablet (10 mg total) by mouth daily. 07/13/14   Kinnie Feil, MD  aspirin EC 81 MG EC tablet Take 1 tablet (81 mg total) by mouth daily. 07/13/14    Kinnie Feil, MD  atorvastatin (LIPITOR) 40 MG tablet Take 40 mg by mouth daily at 6 PM.  10/24/15   Historical Provider, MD  bisoprolol (ZEBETA) 10 MG tablet Take one tablet twice a day 12/29/15   Historical Provider, MD  BREO ELLIPTA 200-25 MCG/INH AEPB Inhale 1 puff into the lungs daily.  01/04/16   Historical Provider, MD  calcitRIOL (ROCALTROL) 0.25 MCG capsule Take 2 capsules by mouth daily.  07/30/14   Historical Provider, MD  doxazosin (CARDURA) 4 MG tablet Take 1 tablet by mouth daily. 06/28/14   Historical Provider, MD  escitalopram (LEXAPRO) 20 MG tablet TAKE 1 TABLET(20 MG) BY MOUTH DAILY 02/09/16   Dorothy Spark, MD  furosemide (LASIX) 80 MG tablet Take 80 mg by mouth. in am and 40 mg by mouth in pm 07/13/14   Dorothy Spark, MD  hydrALAZINE (APRESOLINE) 50 MG tablet Take one tablet by mouth daily 12/21/15   Historical Provider, MD  isosorbide mononitrate (IMDUR) 30 MG 24 hr tablet Take 3 tablets (90 mg total) by mouth daily. 10/25/15   Dorothy Spark, MD  montelukast (SINGULAIR) 10 MG tablet Take 10 mg by mouth at bedtime.    Historical Provider, MD  nystatin (MYCOSTATIN) 100000 UNIT/ML suspension Take 5 mLs by mouth 4 (four) times daily. Swish and Swallow %mL by mouth daily 12/07/14   Historical Provider, MD  omeprazole (PRILOSEC) 20 MG capsule Take 20 mg by mouth daily.    Historical Provider, MD  spironolactone (ALDACTONE) 25 MG tablet Take 25 mg by mouth daily.    Historical Provider, MD   Liver Function Tests No results for input(s): AST, ALT, ALKPHOS, BILITOT, PROT, ALBUMIN in the last 168 hours. No results for input(s): LIPASE, AMYLASE in the last 168 hours. CBC No results for input(s): WBC, NEUTROABS, HGB, HCT, MCV, PLT in the last 168 hours. Basic Metabolic Panel No results for input(s): NA, K, CL, CO2, GLUCOSE, BUN, CREATININE, CALCIUM, PHOS in the last 168 hours.  Invalid input(s): ALB   Filed Vitals:   04/08/2016 0055  BP: 159/74  Temp: 97.7 F (36.5 C)   TempSrc: Oral  Resp: 17  Height: 5' 3"  (1.6 m)  Weight: 105.1 kg (231 lb 11.3 oz)   Exam: VSS Gen alert, obese cushingoid facies, audible exp wheezing, no distress No rash, cyanosis or gangrene Sclera anicteric, throat clear  No jvd or bruits Chest decent air movement, diffuse  exp wheezing mild-mod, no rales RRR 2/6 SEM no RG Abd soft ntnd no mass or ascites +bs obese GU defer MS no joint effusions or deformity Ext 1+ pitting bilat pretib edema / no wounds or ulcers Neuro is alert, Ox 3 , nf, tremulous   EKG (independently reviewed): 5/16 - NSR no acute changes 5/17 - same 5/18 - NSR some ST depression ant/ lat leads  CXR 5/16 - per report was clear CT chest/ abd no contrast on 5/18 > 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 HTN'sive crisis - cont current meds (po labet/ bisoprolol/ hydral/ lasix/ doxazosin/ aldact) and IV NTG. Give IV lasix for next 24 hrs instead of po.  Get labs 2. Chest pain - seems to have resolved w BP control.  Doubt dissection.  Could have ischemic heart disease but cath has been avoided it appears due to CKD. 3. CKD stage IV - creat near baseline. F/B CKA.  4. COPD - still wheezing, says this is baseline.  Cont nebs, decrease solumedrol 60 bid IV/  5. Obesity 6. Depression 7. HL 8. Asthma 9. Hx of DVT/ PE in the past  Plan - cont meds as in transfer except IV lasix for now.  Cardiology consult in am (chest pain, +trop).  Cont IV hep/NTG for now.  Hold on dissection work-up.     DVT Prophylaxis on IV hep  Code Status: full  Family Communication:  none here  Disposition Plan: home when better    Sol Blazing [facility] Pager [phone]  Cell [phone]  If 7PM-7AM, please contact night-coverage [email or web address] Password [email or web address] 04/17/2016, 1:38 AM

## 2016-04-21 NOTE — Progress Notes (Signed)
ANTICOAGULATION CONSULT NOTE - Follow Up Consult  Pharmacy Consult for heparin Indication: chest pain/ACS   Labs:  Recent Labs  05/02/2016 0520 04/09/2016 0521 04/07/2016 0813 04/20/2016 1232 04/18/2016 1240  HGB 11.8*  --   --   --   --   HCT 37.0  --   --   --   --   PLT 151  --   --   --   --   LABPROT  --   --   --   --  15.1  INR  --   --   --   --  1.17  HEPARINUNFRC  --  1.19*  --  0.62  --   CREATININE 2.16*  --   --   --   --   TROPONINI  --   --  7.66*  --   --      Assessment: AC: none pta - in hep for r/o ACS. Level good at 0.62 on 800 units/hr - stopped @1230  d/t no access  CV: htn crisis (holding dissection w/u)  Renal: SCr 2.16  Heme: H&H 11.8/37, Plt 151  Goal of Therapy:  Heparin level 0.3-0.7 units/ml   Plan:  Continue heparin 800 units/hr (when IV access - RN to call) Monitor for s/sx of bleeding Daily HL, CBC F/U Cards recs  Isaac Bliss, PharmD, BCPS, [facility] Clinical Pharmacist Pager [phone] 04/03/2016 1:17 PM

## 2016-04-21 NOTE — Procedures (Signed)
Central Venous Catheter Insertion Procedure Note [name] [name] [id number] [birth year]  Procedure: Insertion of Central Venous Catheter Indications: Drug and/or fluid administration  Procedure Details Consent: Risks of procedure as well as the alternatives and risks of each were explained to the (patient/caregiver).  Consent for procedure obtained. Risks of bleeding, hemothorax, pneumothorax explained to pt and pt agreed to procedure knowing risks and possible complications.   Time Out: Verified patient identification, verified procedure, site/side was marked, verified correct patient position, special equipment/implants available, medications/allergies/relevent history reviewed, required imaging and test results available.  Performed  Maximum sterile technique was used including antiseptics, cap, gloves, gown, hand hygiene, mask and sheet. Skin prep: Chlorhexidine; local anesthetic administered A antimicrobial bonded/coated triple lumen catheter was placed in the right internal jugular vein using the Seldinger technique.  Performed using ultrasound guidance.  Wire visualized in vessel under ultrasound.   Evaluation Blood flow good Complications: No apparent complications Patient did tolerate procedure well. Chest X-ray ordered to verify placement.  CXR: pending.  [name] [name] [name] [name] [name] [name] 04/23/2016, 3:33 PM

## 2016-04-22 LAB — CBC
HCT: 34.2 % — ABNORMAL LOW (ref 36.0–46.0)
Hemoglobin: 11 g/dL — ABNORMAL LOW (ref 12.0–15.0)
MCH: 32.4 pg (ref 26.0–34.0)
MCHC: 32.2 g/dL (ref 30.0–36.0)
MCV: 100.9 fL — AB (ref 78.0–100.0)
Platelets: 146 10*3/uL — ABNORMAL LOW (ref 150–400)
RBC: 3.39 MIL/uL — ABNORMAL LOW (ref 3.87–5.11)
RDW: 14.8 % (ref 11.5–15.5)
WBC: 12.3 10*3/uL — AB (ref 4.0–10.5)

## 2016-04-22 LAB — HEPARIN LEVEL (UNFRACTIONATED)
HEPARIN UNFRACTIONATED: 0.53 [IU]/mL (ref 0.30–0.70)
HEPARIN UNFRACTIONATED: 0.61 [IU]/mL (ref 0.30–0.70)

## 2016-04-22 LAB — GLUCOSE, CAPILLARY
GLUCOSE-CAPILLARY: 164 mg/dL — AB (ref 65–99)
GLUCOSE-CAPILLARY: 149 mg/dL — AB (ref 65–99)
Glucose-Capillary: 210 mg/dL — ABNORMAL HIGH (ref 65–99)
Glucose-Capillary: 166 mg/dL — ABNORMAL HIGH (ref 65–99)

## 2016-04-22 MED ORDER — FUROSEMIDE 40 MG PO TABS: 40.0000 mg | ORAL_TABLET | Freq: Two times a day (BID) | ORAL | Status: DC

## 2016-04-22 MED ORDER — IPRATROPIUM-ALBUTEROL 0.5-2.5 (3) MG/3ML IN SOLN
3.0000 mL | Freq: Three times a day (TID) | RESPIRATORY_TRACT | Status: DC
  Administered 2016-04-23: 3 mL via RESPIRATORY_TRACT
  Filled 2016-04-22: qty 3

## 2016-04-22 MED ORDER — HYDROMORPHONE HCL 1 MG/ML IJ SOLN
1.0000 mg | INTRAMUSCULAR | Status: DC | PRN
  Administered 2016-04-22 – 2016-04-23 (×2): 1 mg via INTRAVENOUS
  Filled 2016-04-22 (×2): qty 1

## 2016-04-22 NOTE — Progress Notes (Signed)
Subjective:  Still wheezing today.  Complains of some neck pain where she had the catheter inserted.  Minimal anterior chest pain today.  Objective:  Vital Signs in the last 24 hours: BP 177/76 mmHg  Pulse 82  Temp(Src) 97.8 F (36.6 C) (Oral)  Resp 22  Ht 5\' 3"  (1.6 m)  Wt 105.9 kg (233 lb 7.5 oz)  BMI 41.37 kg/m2  SpO2 97%  Physical Exam: Obese white female currently in no acute distress.  Skin: Multiple ecchymoses present Neck: Catheters in place centrally Lungs: Diffuse wheezing bilaterally Heart:r rhythm, normal S1 and S2, no S3 Abdomen:  Soft, nontender, no masses, Obese  Extremities: 1+  edema present  Intake/Output from previous day: 05/19 0701 - 05/20 0700 In: 360 [P.O.:360] Out: -   Weight Filed Weights   04/03/2016 0055 04/25/2016 0500 04/22/16 0332  Weight: 105.1 kg (231 lb 11.3 oz) 104.3 kg (229 lb 15 oz) 105.9 kg (233 lb 7.5 oz)    Lab Results: Basic Metabolic Panel:  Recent Labs  88/87/57 0520  NA 142  K 3.7  CL 99*  CO2 29  GLUCOSE 157*  BUN 39*  CREATININE 2.16*   CBC:  Recent Labs  04/10/2016 0520 04/22/16 0415  WBC 14.9* 12.3*  HGB 11.8* 11.0*  HCT 37.0 34.2*  MCV 101.6* 100.9*  PLT 151 146*   Cardiac Panel (last 3 results)  Recent Labs  04/11/2016 0813 04/26/2016 1236 04/23/2016 2046  TROPONINI 7.66* 5.91* 5.39*    Telemetry: Sinus rhythm  Assessment/Plan:  1.  Non-STEMI 2.  Stage IV chronic kidney disease-Some elevation  3.  Hypertensive heart disease blood pressure still elevated 4.  COPD with exacerbation  Recommendations:  Continues on heparin and IV nitroglycerin with minimal pain.  Repeat EKG today.  If remains stable over weekend we'll plan catheterization for Monday.     Darden Palmer  MD [facility] Cardiology  04/22/2016, 10:40 AM

## 2016-04-22 NOTE — Progress Notes (Signed)
ANTICOAGULATION CONSULT NOTE - Follow Up Consult  Pharmacy Consult for heparin Indication: chest pain/ACS   Labs:  Recent Labs  04/19/2016 0520 04/05/2016 0521 04/04/2016 0813 04/05/2016 1232 04/16/2016 1236 04/16/2016 1240 04/25/2016 2046 04/22/16 0027  HGB 11.8*  --   --   --   --   --   --   --   HCT 37.0  --   --   --   --   --   --   --   PLT 151  --   --   --   --   --   --   --   LABPROT  --   --   --   --   --  15.1  --   --   INR  --   --   --   --   --  1.17  --   --   HEPARINUNFRC  --  1.19*  --  0.62  --   --   --  0.53  CREATININE 2.16*  --   --   --   --   --   --   --   TROPONINI  --   --  7.66*  --  5.91*  --  5.39*  --      Assessment: Pt on heparin for NSTEMI. Heparin level remains therapeutic on 800 units/hr. No bleeding noted. Plan for cath on Monday ~1200.  Goal of Therapy:  Heparin level 0.3-0.7 units/ml   Plan:  Continue heparin 800 units/hr  F/u daily heparin level and CBC  Christoper Fabian, PharmD, BCPS Clinical pharmacist, pager [phone] 04/22/2016 12:57 AM

## 2016-04-22 NOTE — Progress Notes (Signed)
ANTICOAGULATION CONSULT NOTE - Follow Up Consult  Pharmacy Consult for heparin Indication: chest pain/ACS   Labs:  Recent Labs  2016-05-07 0520  May 07, 2016 0813 May 07, 2016 1232 05/07/2016 1236 07-May-2016 1240 2016/05/07 2046 04/22/16 0027 04/22/16 0415  HGB 11.8*  --   --   --   --   --   --   --  11.0*  HCT 37.0  --   --   --   --   --   --   --  34.2*  PLT 151  --   --   --   --   --   --   --  146*  LABPROT  --   --   --   --   --  15.1  --   --   --   INR  --   --   --   --   --  1.17  --   --   --   HEPARINUNFRC  --   < >  --  0.62  --   --   --  0.53 0.61  CREATININE 2.16*  --   --   --   --   --   --   --   --   TROPONINI  --   --  7.66*  --  5.91*  --  5.39*  --   --   < > = values in this interval not displayed.   Assessment: AC: none pta - in hep for r/o ACS. Level good at 0.61 on 800 units/hr. Cath Mon  CV: htn crisis (holding dissection w/u)  Renal: SCr 2.16  Heme: H&H 11/34.2, Plt 146  Goal of Therapy:  Heparin level 0.3-0.7 units/ml   Plan:   Continue heparin 800 units/hr Monitor for s/sx of bleeding Daily HL, CBC  [name] [name], PharmD Pager: [phone] 04/22/2016 9:21 AM

## 2016-04-22 NOTE — Progress Notes (Signed)
Pt has been having headaches and pain in neck area where IJ is, doctor notified to request PRN med.

## 2016-04-22 NOTE — Progress Notes (Signed)
[facility]  PROGRESS NOTE  [name] [name] WCB:[id number] DOB: February 17, 1941 DOA: 04/19/2016 PCP: Konrad Felix, MD    Brief HPI:  75 y.o. female with history of COPD, CKD, diast CHF, angina/ MI x 2 (no hx cath) presented to [facility] on 5/16 with SOB and acute/ chron respiratory failure. Treated for acute exacerbation of COPD (chronic / recurrent issue). BP's was high w SBP's in the 240's according to the patient, and on 5/17-18 she developed severe chest pain w radiation into bilat shoulders / jaw. Trop was up at 1.04. EKG showed some anterolateral ST depression, not severe. They were concerned about aortic dissection but couldn't give dye due to CKD and didn't have MRA so pt was transferred to [facility].   Principal Problem:   Chest pain Active Problems:   COPD with acute exacerbation (HCC)   Hypertensive crisis   S/P IVC filter   NSTEMI (non-ST elevated myocardial infarction) (HCC)   Chronic kidney disease (CKD), stage IV (severe) (HCC)   Depression   Chronic diastolic HF (heart failure), NYHA class 2 (HCC)   Assessment/Plan: 1. Non-STEMI- continue heparin per pharmacy consultation, nitroglycerin has been discontinued. Continue aspirin, atorvastatin. Cardiology has been consulted and plan for cardiac cath on Monday. 2. COPD exacerbation- patient has bilateral wheezing, continue Solu-Medrol 60 mg IV every 6 hours, DuoNeb nebulizers every 6 hours. 3. Hypertensive urgency- resolved, off nitroglycerin drip. Continue labetalol, Aldactone, hydralazine 4. History of diastolic heart failure- BNP 155, patient on Lasix 80 mg daily at home. She was started on Lasix IV 60 mg every 12 hours in the hospital. Will discontinue IV Lasix and restart po lasix  from tomorrow morning. 5. C KD stage IV- creatinine at baseline, follow BMP in a.m. 6. Hyperglycemia- secondary to steroids, continue sliding scale insulin with NovoLog   DVT prophylaxis: * Patient on heparin protocol Code Status: Full  code Family Communication: No family present at bedside  Disposition Plan: Pending cardiac cath on Monday   Consultants:  Cardiology  Procedures:  None  Antibiotics:  None   Subjective: Patient seen and examined, complains of shortness of breath. Denies chest pain. Troponin was elevated, cards consulted  Objective: Filed Vitals:   04/22/16 0332 04/22/16 0725 04/22/16 0819 04/22/16 1108  BP: 160/68 177/76  164/69  Pulse: 85 82  88  Temp: 97.4 F (36.3 C) 97.8 F (36.6 C)  97.8 F (36.6 C)  TempSrc: Oral Oral  Oral  Resp: 11 22  17   Height: 5\' 3"  (1.6 m)     Weight: 105.9 kg (233 lb 7.5 oz)     SpO2: 92% 94% 97% 96%    Intake/Output Summary (Last 24 hours) at 04/22/16 1357 Last data filed at 04/22/16 1100  Gross per 24 hour  Intake    475 ml  Output      0 ml  Net    475 ml   Filed Weights   04/17/2016 0055 04/23/2016 0500 04/22/16 0332  Weight: 105.1 kg (231 lb 11.3 oz) 104.3 kg (229 lb 15 oz) 105.9 kg (233 lb 7.5 oz)    Examination:  General exam: Appears calm and comfortable  Respiratory system: Bilateral wheezing. Respiratory effort normal. Cardiovascular system: S1 & S2 heard, RRR. No JVD, murmurs, rubs, gallops or clicks. No pedal edema. Gastrointestinal system: Abdomen is nondistended, soft and nontender. No organomegaly or masses felt. Normal bowel sounds heard. Central nervous system: Alert and oriented. No focal neurological deficits. Extremities: Symmetric 5 x 5 power. Skin: No rashes, lesions or ulcers  Psychiatry: Judgement and insight appear normal. Mood & affect appropriate.    Data Reviewed: I have personally reviewed following labs and imaging studies Basic Metabolic Panel:  Recent Labs Lab 04/09/2016 0520  NA 142  K 3.7  CL 99*  CO2 29  GLUCOSE 157*  BUN 39*  CREATININE 2.16*  CALCIUM 8.3*   Liver Function Tests: No results for input(s): AST, ALT, ALKPHOS, BILITOT, PROT, ALBUMIN in the last 168 hours. No results for input(s):  LIPASE, AMYLASE in the last 168 hours. No results for input(s): AMMONIA in the last 168 hours. CBC:  Recent Labs Lab 04/20/2016 0520 04/22/16 0415  WBC 14.9* 12.3*  HGB 11.8* 11.0*  HCT 37.0 34.2*  MCV 101.6* 100.9*  PLT 151 146*   Cardiac Enzymes:  Recent Labs Lab 04/05/2016 0813 04/23/2016 1236 04/05/2016 2046  TROPONINI 7.66* 5.91* 5.39*   BNP (last 3 results) No results for input(s): BNP in the last 8760 hours.  ProBNP (last 3 results) No results for input(s): PROBNP in the last 8760 hours.  CBG:  Recent Labs Lab 04/30/2016 1148 04/29/2016 1640 04/30/2016 2119 04/22/16 0734 04/22/16 1158  GLUCAP 186* 138* 194* 164* 210*    Recent Results (from the past 240 hour(s))  MRSA PCR Screening     Status: None   Collection Time: 04/15/2016  1:52 AM  Result Value Ref Range Status   MRSA by PCR NEGATIVE NEGATIVE Final    Comment:        The GeneXpert MRSA Assay (FDA approved for NASAL specimens only), is one component of a comprehensive MRSA colonization surveillance program. It is not intended to diagnose MRSA infection nor to guide or monitor treatment for MRSA infections.      Studies: Dg Chest 1 View  04/14/2016  CLINICAL DATA:  Central line placement EXAM: CHEST 1 VIEW COMPARISON:  04/18/2016 chest radiograph. FINDINGS: Right internal jugular central venous catheter terminates in the middle third of the superior vena cava. Stable cardiomediastinal silhouette with top-normal heart size. No pneumothorax. No right pleural effusion. No overt pulmonary edema. Small left pleural effusion, probably stable. Mild left basilar atelectasis. IMPRESSION: 1. Right internal jugular central venous catheter terminates in the middle third of the superior vena cava. No pneumothorax. 2. Stable small left pleural effusion with mild left basilar atelectasis. Electronically Signed   By: Delbert Phenix M.D.   On: 04/03/2016 15:48    Scheduled Meds: . amLODipine  5 mg Oral Daily  . aspirin EC   81 mg Oral Daily  . atorvastatin  80 mg Oral q1800  . calcitRIOL  0.25 mcg Oral Daily  . doxazosin  4 mg Oral QHS  . furosemide  60 mg Intravenous Q12H  . hydrALAZINE  50 mg Oral Q8H  . insulin aspart  0-15 Units Subcutaneous TID WC  . insulin aspart  0-5 Units Subcutaneous QHS  . ipratropium-albuterol  3 mL Nebulization Q6H  . labetalol  300 mg Oral BID  . methylPREDNISolone (SOLU-MEDROL) injection  60 mg Intravenous Q6H  . montelukast  10 mg Oral QHS  . spironolactone  25 mg Oral Daily  . traZODone  50 mg Oral QHS   Continuous Infusions: . heparin 800 Units/hr (04/22/16 1100)  . nitroGLYCERIN 50 mcg/min (04/22/16 1100)       Time spent: 20 min    [facility] S  [facility] Pager [phone]. If 7PM-7AM, please contact night-coverage at [email or web address], Office  [phone]  password [email or web address] 04/22/2016, 1:57 PM  LOS: 1 day

## 2016-04-23 ENCOUNTER — Inpatient Hospital Stay (HOSPITAL_COMMUNITY): Payer: Medicare Other

## 2016-04-23 DIAGNOSIS — R0600 Dyspnea, unspecified: Secondary | ICD-10-CM

## 2016-04-23 DIAGNOSIS — S1093XA Contusion of unspecified part of neck, initial encounter: Secondary | ICD-10-CM

## 2016-04-23 LAB — BASIC METABOLIC PANEL
ANION GAP: 13 (ref 5–15)
BUN: 57 mg/dL — ABNORMAL HIGH (ref 6–20)
CHLORIDE: 95 mmol/L — AB (ref 101–111)
CO2: 31 mmol/L (ref 22–32)
CREATININE: 2.42 mg/dL — AB (ref 0.44–1.00)
Calcium: 8.7 mg/dL — ABNORMAL LOW (ref 8.9–10.3)
GFR calc non Af Amer: 19 mL/min — ABNORMAL LOW (ref >60)
GFR, EST AFRICAN AMERICAN: 22 mL/min — AB (ref >60)
GLUCOSE: 179 mg/dL — AB (ref 65–99)
Potassium: 3.3 mmol/L — ABNORMAL LOW (ref 3.5–5.1)
Sodium: 139 mmol/L (ref 135–145)

## 2016-04-23 LAB — CBC
HCT: 34 % — ABNORMAL LOW (ref 36.0–46.0)
HEMATOCRIT: 34.1 % — AB (ref 36.0–46.0)
HEMOGLOBIN: 11 g/dL — AB (ref 12.0–15.0)
HEMOGLOBIN: 10.9 g/dL — AB (ref 12.0–15.0)
MCH: 32.4 pg (ref 26.0–34.0)
MCH: 32.2 pg (ref 26.0–34.0)
MCHC: 32.3 g/dL (ref 30.0–36.0)
MCHC: 32.1 g/dL (ref 30.0–36.0)
MCV: 100.6 fL — AB (ref 78.0–100.0)
MCV: 100.6 fL — AB (ref 78.0–100.0)
Platelets: 146 10*3/uL — ABNORMAL LOW (ref 150–400)
Platelets: 140 10*3/uL — ABNORMAL LOW (ref 150–400)
RBC: 3.39 MIL/uL — ABNORMAL LOW (ref 3.87–5.11)
RBC: 3.38 MIL/uL — AB (ref 3.87–5.11)
RDW: 14.5 % (ref 11.5–15.5)
RDW: 14.3 % (ref 11.5–15.5)
WBC: 12.4 10*3/uL — ABNORMAL HIGH (ref 4.0–10.5)
WBC: 12.9 10*3/uL — ABNORMAL HIGH (ref 4.0–10.5)

## 2016-04-23 LAB — BLOOD GAS, ARTERIAL
ACID-BASE EXCESS: 3.5 mmol/L — AB (ref 0.0–2.0)
Bicarbonate: 27.6 mEq/L — ABNORMAL HIGH (ref 20.0–24.0)
DRAWN BY: 105521
O2 Content: 2 L/min
O2 SAT: 97 %
PATIENT TEMPERATURE: 98.6
PCO2 ART: 43 mmHg (ref 35.0–45.0)
TCO2: 29 mmol/L (ref 0–100)
pH, Arterial: 7.424 (ref 7.350–7.450)
pO2, Arterial: 87.6 mmHg (ref 80.0–100.0)

## 2016-04-23 LAB — GLUCOSE, CAPILLARY
GLUCOSE-CAPILLARY: 143 mg/dL — AB (ref 65–99)
GLUCOSE-CAPILLARY: 146 mg/dL — AB (ref 65–99)
GLUCOSE-CAPILLARY: 185 mg/dL — AB (ref 65–99)
GLUCOSE-CAPILLARY: 136 mg/dL — AB (ref 65–99)
Glucose-Capillary: 152 mg/dL — ABNORMAL HIGH (ref 65–99)

## 2016-04-23 LAB — HEPARIN LEVEL (UNFRACTIONATED): HEPARIN UNFRACTIONATED: 0.93 [IU]/mL — AB (ref 0.30–0.70)

## 2016-04-23 LAB — PROTIME-INR
INR: 1.13 (ref 0.00–1.49)
Prothrombin Time: 14.7 seconds (ref 11.6–15.2)

## 2016-04-23 MED ORDER — PANTOPRAZOLE SODIUM 40 MG IV SOLR
40.0000 mg | INTRAVENOUS | Status: DC
  Administered 2016-04-23: 40 mg via INTRAVENOUS
  Filled 2016-04-23 (×2): qty 40

## 2016-04-23 MED ORDER — CHLORHEXIDINE GLUCONATE 0.12 % MT SOLN
15.0000 mL | Freq: Two times a day (BID) | OROMUCOSAL | Status: DC
  Administered 2016-04-23 (×2): 15 mL via OROMUCOSAL

## 2016-04-23 MED ORDER — METHYLPREDNISOLONE SODIUM SUCC 40 MG IJ SOLR
40.0000 mg | Freq: Three times a day (TID) | INTRAMUSCULAR | Status: DC
  Administered 2016-04-23 – 2016-04-24 (×2): 40 mg via INTRAVENOUS
  Filled 2016-04-23 (×3): qty 1

## 2016-04-23 MED ORDER — POTASSIUM CHLORIDE CRYS ER 20 MEQ PO TBCR: 40.0000 meq | EXTENDED_RELEASE_TABLET | Freq: Once | ORAL | Status: DC

## 2016-04-23 MED ORDER — IPRATROPIUM-ALBUTEROL 0.5-2.5 (3) MG/3ML IN SOLN
3.0000 mL | Freq: Four times a day (QID) | RESPIRATORY_TRACT | Status: DC
  Administered 2016-04-23 – 2016-04-24 (×5): 3 mL via RESPIRATORY_TRACT
  Filled 2016-04-23 (×6): qty 3

## 2016-04-23 MED ORDER — CETYLPYRIDINIUM CHLORIDE 0.05 % MT LIQD
7.0000 mL | Freq: Two times a day (BID) | OROMUCOSAL | Status: DC
  Administered 2016-04-23 (×2): 7 mL via OROMUCOSAL

## 2016-04-23 MED ORDER — HYDROMORPHONE HCL 1 MG/ML IJ SOLN
INTRAMUSCULAR | Status: AC
  Filled 2016-04-23: qty 1

## 2016-04-23 MED ORDER — LABETALOL HCL 5 MG/ML IV SOLN
20.0000 mg | INTRAVENOUS | Status: DC | PRN
  Administered 2016-04-25 – 2016-04-26 (×5): 20 mg via INTRAVENOUS
  Filled 2016-04-23 (×5): qty 4

## 2016-04-23 MED ORDER — HYDROMORPHONE HCL 1 MG/ML IJ SOLN
1.0000 mg | INTRAMUSCULAR | Status: DC | PRN
  Administered 2016-04-23 – 2016-05-07 (×45): 1 mg via INTRAVENOUS
  Filled 2016-04-23 (×45): qty 1

## 2016-04-23 MED ORDER — INSULIN ASPART 100 UNIT/ML ~~LOC~~ SOLN
0.0000 [IU] | SUBCUTANEOUS | Status: DC
  Administered 2016-04-23 (×2): 3 [IU] via SUBCUTANEOUS
  Administered 2016-04-23: 4 [IU] via SUBCUTANEOUS
  Administered 2016-04-24 (×3): 3 [IU] via SUBCUTANEOUS
  Administered 2016-04-24 (×2): 4 [IU] via SUBCUTANEOUS

## 2016-04-23 MED ORDER — LORAZEPAM 2 MG/ML IJ SOLN
0.5000 mg | INTRAMUSCULAR | Status: DC | PRN
  Administered 2016-04-27 – 2016-05-07 (×14): 0.5 mg via INTRAVENOUS
  Filled 2016-04-23 (×15): qty 1

## 2016-04-23 MED ORDER — HYDRALAZINE HCL 20 MG/ML IJ SOLN
10.0000 mg | INTRAMUSCULAR | Status: DC | PRN
  Administered 2016-04-23: 10 mg via INTRAVENOUS
  Administered 2016-04-23: 21:00:00 via INTRAVENOUS
  Administered 2016-04-24 – 2016-05-12 (×22): 10 mg via INTRAVENOUS
  Filled 2016-04-23 (×25): qty 1

## 2016-04-23 NOTE — Progress Notes (Signed)
[facility]  PROGRESS NOTE  [name] [name] ZOX:[id number] DOB: Dec 21, 1940 DOA: 29-Apr-2016 PCP: Konrad Felix, MD    Brief HPI:  75 y.o. female with history of COPD, CKD, diast CHF, angina/ MI x 2 (no hx cath) presented to [facility] on 5/16 with SOB and acute/ chron respiratory failure. Treated for acute exacerbation of COPD (chronic / recurrent issue). BP's was high w SBP's in the 240's according to the patient, and on 5/17-18 she developed severe chest pain w radiation into bilat shoulders / jaw. Trop was up at 1.04. EKG showed some anterolateral ST depression, not severe. They were concerned about aortic dissection but couldn't give dye due to CKD and didn't have MRA so pt was transferred to [facility].   Principal Problem:   Chest pain Active Problems:   COPD with acute exacerbation (HCC)   Hypertensive crisis   S/P IVC filter   NSTEMI (non-ST elevated myocardial infarction) (HCC)   Chronic kidney disease (CKD), stage IV (severe) (HCC)   Depression   Chronic diastolic HF (heart failure), NYHA class 2 (HCC)   Assessment/Plan: 1. Non-STEMI- continue heparin per pharmacy consultation, nitroglycerin has been discontinued. Continue aspirin, atorvastatin. Cardiology has been consulted and plan for cardiac cath on Monday. 2. Neck swelling- ? Hematoma, will check soft tissue xray of neck. Consult CCM, called and discussed with Dr Coralyn Helling. 3. COPD exacerbation- patient has bilateral wheezing, continue Solu-Medrol 60 mg IV every 6 hours, DuoNeb nebulizers every 6 hours. 4. Hypertensive urgency- resolved,on nitroglycerin drip. Continue labetalol, Aldactone, hydralazine 5. History of diastolic heart failure- BNP 155, patient on Lasix 80 mg daily at home. She was started on Lasix IV 60 mg every 12 hours in the hospital. IV lasix has been discontinued. 6. Hypokalemia- replace potassium, and check bmp in am. 7. C KD stage IV- creatinine at baseline, follow BMP in a.m. 8. Hyperglycemia-  secondary to steroids, continue sliding scale insulin with NovoLog   DVT prophylaxis: * Patient on heparin protocol Code Status: Full code Family Communication: No family present at bedside  Disposition Plan: patient to be transferred to ICU   Consultants:  Cardiology  Procedures:  None  Antibiotics:  None   Subjective: Called by RN this morning, that patient developed swelling of the neck at the site of IJ CVL.  Patient seen and examined, denies shortness of breath, but does complain of difficulty swallowing.  Objective: Filed Vitals:   04/22/16 2308 04/23/16 0245 04/23/16 0727 04/23/16 0800  BP: 149/66 163/69  175/73  Pulse: 81 87  77  Temp: 97.5 F (36.4 C) 97.8 F (36.6 C)  97.5 F (36.4 C)  TempSrc: Oral Oral  Oral  Resp: 13 15  13   Height:      Weight:  105.5 kg (232 lb 9.4 oz)    SpO2: 92% 98% 97% 98%    Intake/Output Summary (Last 24 hours) at 04/23/16 1049 Last data filed at 04/23/16 0600  Gross per 24 hour  Intake 577.25 ml  Output    400 ml  Net 177.25 ml   Filed Weights   29-Apr-2016 0500 04/22/16 0332 04/23/16 0245  Weight: 104.3 kg (229 lb 15 oz) 105.9 kg (233 lb 7.5 oz) 105.5 kg (232 lb 9.4 oz)    Examination:  General exam: Appears calm and comfortable  Respiratory system: Bilateral wheezing. Respiratory effort normal. Neck: Edema noted at the site of right IJ CVL, no erythema, mild tenderness to palpation Cardiovascular system: S1 & S2 heard, RRR. No JVD, murmurs, rubs, gallops  or clicks. No pedal edema. Gastrointestinal system: Abdomen is nondistended, soft and nontender. No organomegaly or masses felt. Normal bowel sounds heard. Central nervous system: Alert and oriented. No focal neurological deficits. Extremities: Symmetric 5 x 5 power. Skin: No rashes, lesions or ulcers Psychiatry: Judgement and insight appear normal. Mood & affect appropriate.    Data Reviewed: I have personally reviewed following labs and imaging studies Basic  Metabolic Panel:  Recent Labs Lab 2016-05-19 0520 04/23/16 0228  NA 142 139  K 3.7 3.3*  CL 99* 95*  CO2 29 31  GLUCOSE 157* 179*  BUN 39* 57*  CREATININE 2.16* 2.42*  CALCIUM 8.3* 8.7*   CBC:  Recent Labs Lab 05-19-16 0520 04/22/16 0415 04/23/16 0228 04/23/16 0758  WBC 14.9* 12.3* 12.4* 12.9*  HGB 11.8* 11.0* 11.0* 10.9*  HCT 37.0 34.2* 34.1* 34.0*  MCV 101.6* 100.9* 100.6* 100.6*  PLT 151 146* 146* 140*   Cardiac Enzymes:  Recent Labs Lab May 19, 2016 0813 2016/05/19 1236 05-19-2016 2046  TROPONINI 7.66* 5.91* 5.39*    CBG:  Recent Labs Lab 04/22/16 1158 04/22/16 1734 04/22/16 2201 04/23/16 0811 04/23/16 1013  GLUCAP 210* 166* 149* 152* 143*    Recent Results (from the past 240 hour(s))  MRSA PCR Screening     Status: None   Collection Time: 05/19/2016  1:52 AM  Result Value Ref Range Status   MRSA by PCR NEGATIVE NEGATIVE Final    Comment:        The GeneXpert MRSA Assay (FDA approved for NASAL specimens only), is one component of a comprehensive MRSA colonization surveillance program. It is not intended to diagnose MRSA infection nor to guide or monitor treatment for MRSA infections.      Studies: Dg Neck Soft Tissue  04/23/2016  CLINICAL DATA:  Right neck pain/ swelling EXAM: NECK SOFT TISSUES - 1+ VIEW COMPARISON:  None. FINDINGS: Prevertebral soft tissues anterior to the upper cervical spine are markedly enlarged on the lateral view, indicating prevertebral edema. On the frontal radiograph, the trachea is deviated to the left. IMPRESSION: Suspected prevertebral edema with leftward tracheal deviation. CT neck with contrast is suggested for further characterization. Electronically Signed   By: Charline Bills M.D.   On: 04/23/2016 09:25   Dg Chest 1 View  05/19/16  CLINICAL DATA:  Central line placement EXAM: CHEST 1 VIEW COMPARISON:  04/18/2016 chest radiograph. FINDINGS: Right internal jugular central venous catheter terminates in the middle  third of the superior vena cava. Stable cardiomediastinal silhouette with top-normal heart size. No pneumothorax. No right pleural effusion. No overt pulmonary edema. Small left pleural effusion, probably stable. Mild left basilar atelectasis. IMPRESSION: 1. Right internal jugular central venous catheter terminates in the middle third of the superior vena cava. No pneumothorax. 2. Stable small left pleural effusion with mild left basilar atelectasis. Electronically Signed   By: Delbert Phenix M.D.   On: 2016/05/19 15:48    Scheduled Meds: . antiseptic oral rinse  7 mL Mouth Rinse q12n4p  . chlorhexidine  15 mL Mouth Rinse BID  . insulin aspart  0-20 Units Subcutaneous Q4H  . ipratropium-albuterol  3 mL Nebulization Q6H  . methylPREDNISolone (SOLU-MEDROL) injection  40 mg Intravenous Q8H  . pantoprazole (PROTONIX) IV  40 mg Intravenous Q24H   Continuous Infusions: . nitroGLYCERIN Stopped (04/23/16 0700)       Time spent: 20 min    [facility] S  [facility] Pager [phone]. If 7PM-7AM, please contact night-coverage at [email or web address], Office  [phone]  password [facility]  04/23/2016, 10:49 AM  LOS: 2 days

## 2016-04-23 NOTE — Progress Notes (Signed)
Pt states "it's getting harder to breath" transported pt to x-ay to obtain image of neck, ecchymosis is progressing was well as swelling with associated firmness. Dr. Sharl Ma repaged with orders to continue to monitor the patient and to call CCM if patients condition changes.

## 2016-04-23 NOTE — Progress Notes (Addendum)
Subjective:  Walked in to find patient lethargic and c/o that she might stop breathing.  She has an expanding hematoma in her neck and heparin was stopped earlier.  No complaints of chest pain.  Renal function has worsened overnight.  Objective:  Vital Signs in the last 24 hours: BP 163/69 mmHg  Pulse 87  Temp(Src) 97.5 F (36.4 C) (Oral)  Resp 15  Ht 5\' 3"  (1.6 m)  Wt 105.5 kg (232 lb 9.4 oz)  BMI 41.21 kg/m2  SpO2 97%  Physical Exam: Obese white female currently lethargic with mild respiratory distress. Skin: Multiple ecchymoses present Neck: Catheters in place centrally, large firm hematoma at the site of the previous IJ line on the Lungs: Diffuse wheezing bilaterally, somewhat poor air movement Heart:r rhythm, normal S1 and S2, no S3 Abdomen:  Soft, nontender, no masses, Obese  Extremities: 1+  edema present  Intake/Output from previous day: 05/20 0701 - 05/21 0700 In: 646.3 [P.O.:120; I.V.:526.3] Out: 400 [Urine:400]  Weight Filed Weights   05-05-2016 0500 04/22/16 0332 04/23/16 0245  Weight: 104.3 kg (229 lb 15 oz) 105.9 kg (233 lb 7.5 oz) 105.5 kg (232 lb 9.4 oz)    Lab Results: Basic Metabolic Panel:  Recent Labs  80/88/11 0520 04/23/16 0228  NA 142 139  K 3.7 3.3*  CL 99* 95*  CO2 29 31  GLUCOSE 157* 179*  BUN 39* 57*  CREATININE 2.16* 2.42*   CBC:  Recent Labs  04/23/16 0228 04/23/16 0758  WBC 12.4* 12.9*  HGB 11.0* 10.9*  HCT 34.1* 34.0*  MCV 100.6* 100.6*  PLT 146* 140*   Cardiac Panel (last 3 results)  Recent Labs  05-05-16 0813 2016-05-05 1236 May 05, 2016 2046  TROPONINI 7.66* 5.91* 5.39*    Telemetry: Sinus rhythm  Assessment/Plan:  1.  Non-STEMI 2.  Stage IV chronic kidney disease-Worsened renal function today  3.  Hypertensive heart disease blood pressure still elevated 4.  COPD with exacerbation, possible impending respiratory failure 5.  Large hematoma at the site of the right IJ line  Recommendations:  I was concerned  about her clinical appearance when I walked into the room and asked for critical care, to urgently evaluate the patient.  She is currently off of heparin and her renal function is worse and will need to defer the catheterization until we can stabilize her.  Would stop furosemide at the present time.     Darden Palmer  MD [facility]  04/23/2016, 9:26 AM

## 2016-04-23 NOTE — Consult Note (Signed)
PULMONARY / CRITICAL CARE MEDICINE   Name: [name] [name] MRN: [id number] DOB: 1941/07/08    ADMISSION DATE:  04/05/2016 CONSULTATION DATE:  04/23/2016  REFERRING MD:  Viann Fish, Teodora Medici  CHIEF COMPLAINT:  Can't breath  HISTORY OF PRESENT ILLNESS:   75 yo female presented to [facility] with dyspnea.  She was tx for AECOPD.  She had BP 240's.  She developed chest pain.  Troponin was elevated and had ECG changes.  She was started on IV NTG and heparin.  She was transferred to [facility] and seen by cardiology.  She was difficult IV access and had Rt IJ CVL placed on 5/19.  She developed neck pain and swelling that progressed.  She was noted to have a hematoma in her neck.  She then starting feeling like she couldn't get air in.  She is currently able to speak in full sentences.  SpO2 97%.  She denies hx of thyroid disease.  She has never been tested for sleep apnea, but there has been concern for this.  She last received aspirin at 1112 on 5/20.  Heparin gtt was stopped at in AM of 5/21.  PAST MEDICAL HISTORY :  She  has a past medical history of Hypertension; History of blood clots; High cholesterol; COPD (chronic obstructive pulmonary disease) (HCC); Asthma; GERD (gastroesophageal reflux disease); PONV (postoperative nausea and vomiting); MI (myocardial infarction) (HCC); Heart murmur; Shortness of breath; Stroke (HCC); Anxiety; Depression; Difficulty swallowing solids; Stomach ulcer; Gout; History of shingles; Rash; Bruises easily; and Renal disorder.  PAST SURGICAL HISTORY: She  has past surgical history that includes Abdominal hysterectomy; Breast surgery (Left); Appendectomy; Colonoscopy w/ polypectomy; Upper gi endoscopy; Esophagus surgery; IVC filter; Cataract extraction, bilateral (Bilateral); and Shoulder hemi-arthroplasty (Right, 04/16/2015).  Allergies  Allergen Reactions  . Ciprofloxacin     rash    No current facility-administered medications on file prior to encounter.    Current Outpatient Prescriptions on File Prior to Encounter  Medication Sig  . aspirin EC 81 MG EC tablet Take 1 tablet (81 mg total) by mouth daily.  Marland Kitchen atorvastatin (LIPITOR) 40 MG tablet Take 40 mg by mouth daily at 6 PM.   . bisoprolol (ZEBETA) 10 MG tablet Take one tablet daily  . BREO ELLIPTA 200-25 MCG/INH AEPB Inhale 1 puff into the lungs daily.   . calcitRIOL (ROCALTROL) 0.25 MCG capsule Take 2 capsules by mouth daily.   Marland Kitchen doxazosin (CARDURA) 4 MG tablet Take 1 tablet by mouth daily.  Marland Kitchen escitalopram (LEXAPRO) 20 MG tablet TAKE 1 TABLET(20 MG) BY MOUTH DAILY  . furosemide (LASIX) 80 MG tablet Take 80 mg by mouth daily.   . isosorbide mononitrate (IMDUR) 30 MG 24 hr tablet Take 3 tablets (90 mg total) by mouth daily.  . montelukast (SINGULAIR) 10 MG tablet Take 10 mg by mouth at bedtime.  Marland Kitchen omeprazole (PRILOSEC) 20 MG capsule Take 20 mg by mouth daily.  Marland Kitchen spironolactone (ALDACTONE) 25 MG tablet Take 25 mg by mouth daily.    FAMILY HISTORY:  Her indicated that her mother is deceased. She indicated that her father is deceased.   SOCIAL HISTORY: She  reports that she quit smoking about 3 years ago. Her smoking use included Cigarettes. She has a 32.5 pack-year smoking history. She does not have any smokeless tobacco history on file. She reports that she does not drink alcohol or use illicit drugs.  REVIEW OF SYSTEMS:   Negative except above.  SUBJECTIVE:   VITAL SIGNS: BP 163/69 mmHg  Pulse 87  Temp(Src) 97.5 F (36.4 C) (Oral)  Resp 15  Ht 5\' 3"  (1.6 m)  Wt 232 lb 9.4 oz (105.5 kg)  BMI 41.21 kg/m2  SpO2 97%  HEMODYNAMICS:    VENTILATOR SETTINGS:    INTAKE / OUTPUT: I/O last 3 completed shifts: In: 1029.3 [P.O.:480; I.V.:549.3] Out: 400 [Urine:400]  PHYSICAL EXAMINATION: General:  Alert, no increase in WOB Neuro:  Normal strength, CN intact HEENT:  Hematoma Rt neck with tracheal deviation to Lt Cardiovascular:  Regular, no murmur Lungs:  Decreased BS, no  wheeze Abdomen:  Soft, non tender Musculoskeletal:  1+ edema Skin:  No rashes  LABS:  BMET  Recent Labs Lab 05/01/2016 0520 04/23/16 0228  NA 142 139  K 3.7 3.3*  CL 99* 95*  CO2 29 31  BUN 39* 57*  CREATININE 2.16* 2.42*  GLUCOSE 157* 179*    Electrolytes  Recent Labs Lab 04/08/2016 0520 04/23/16 0228  CALCIUM 8.3* 8.7*    CBC  Recent Labs Lab 04/22/16 0415 04/23/16 0228 04/23/16 0758  WBC 12.3* 12.4* 12.9*  HGB 11.0* 11.0* 10.9*  HCT 34.2* 34.1* 34.0*  PLT 146* 146* 140*    Coag's  Recent Labs Lab 04/30/2016 1240  INR 1.17    Sepsis Markers No results for input(s): LATICACIDVEN, PROCALCITON, O2SATVEN in the last 168 hours.  ABG No results for input(s): PHART, PCO2ART, PO2ART in the last 168 hours.  Liver Enzymes No results for input(s): AST, ALT, ALKPHOS, BILITOT, ALBUMIN in the last 168 hours.  Cardiac Enzymes  Recent Labs Lab 04/14/2016 0813 04/03/2016 1236 04/28/2016 2046  TROPONINI 7.66* 5.91* 5.39*    Glucose  Recent Labs Lab 04/15/2016 2119 04/22/16 0734 04/22/16 1158 04/22/16 1734 04/22/16 2201 04/23/16 0811  GLUCAP 194* 164* 210* 166* 149* 152*    Imaging Dg Neck Soft Tissue  04/23/2016  CLINICAL DATA:  Right neck pain/ swelling EXAM: NECK SOFT TISSUES - 1+ VIEW COMPARISON:  None. FINDINGS: Prevertebral soft tissues anterior to the upper cervical spine are markedly enlarged on the lateral view, indicating prevertebral edema. On the frontal radiograph, the trachea is deviated to the left. IMPRESSION: Suspected prevertebral edema with leftward tracheal deviation. CT neck with contrast is suggested for further characterization. Electronically Signed   By: Charline Bills M.D.   On: 04/23/2016 09:25     STUDIES:  5/19 Echo >> mod LVH, EF 65 to 70%, grade 1 diastolic CHF  CULTURES:  ANTIBIOTICS:  SIGNIFICANT EVENTS: 5/19 Admit, cardiology consulted 5/21 Transfer to ICU with respiratory distress from Rt neck  hematoma  LINES/TUBES: 5/19 Rt IJ CVL >>  DISCUSSION: 75 yo female admitted with NSTEMI, HTN urgency, and AECOPD.  She has hx of CKD and difficulty IV access.  She had Rt IJ CVL placed 5/19 >> developed Rt neck hematoma with airway compromise and transferred to ICU.  ASSESSMENT / PLAN:  PULMONARY A: Acute respiratory distress 2nd to Rt neck hematoma with airway compromise. AECOPD. Presumed sleep disordered breathing. P:   Transfer to ICU BiPAP prn >> hopefully can avoid intubation Oxygen to keep SpO2 > 92% F/u CXR Continue duoneb, solumedrol  CARDIOVASCULAR A:  NSTEMI. HTN urgency with acute on chronic diastolic CHF. Hx of HTN. P:  Stop ASA, heparin gtt for now Hold norvasc, lipitor, cardura, hydralazine, labetalol, aldactone pills until able to swallow Prn IV labetalol, hydralazine to keep SBP < 160  RENAL A:   CKD IV. P:   Monitor renal fx, urine outpt  GASTROINTESTINAL A:   Nutrition.  Hx of GERD. P:   NPO Protonix  HEMATOLOGIC A:   Anemia of chronic disease. P:  F/u CBC SCDs  INFECTIOUS A:   No evidence for infection. P:   Monitor clinically  ENDOCRINE A:   Steroid induced hyperglycemia. P:   SSI  NEUROLOGIC A:   Hx of CVA, Anxiety, Depression. P:   Monitor clinically  CC time 34 minutes.  Coralyn Helling, MD [facility] Pulmonary/Critical Care 04/23/2016, 9:58 AM Pager:  [phone] After 3pm call: [phone]

## 2016-04-23 NOTE — Progress Notes (Signed)
Dr. Donnie Aho at bedside to see patient, request CCM be called due to patients condition, informed him CCM has been contacted multiple times with updates given. Dr. Donnie Aho called CCM as well. No new orders at this time will continue to monitor.

## 2016-04-23 NOTE — Progress Notes (Signed)
ANTICOAGULATION CONSULT NOTE - Follow Up Consult  Pharmacy Consult for heparin Indication: chest pain/ACS  Patient Measurements: Height: 5\' 3"  (160 cm) Weight: 231 lb 11.3 oz (105.1 kg) IBW/kg (Calculated) : 52.4 Heparin Dosing Weight: 80kg  Labs:  Recent Labs  04/11/2016 0520  Apr 24, 2016 0813  04/18/2016 1236 05/03/2016 1240 04/15/2016 2046 04/22/16 0027 04/22/16 0415 04/23/16 0228  HGB 11.8*  --   --   --   --   --   --   --  11.0* 11.0*  HCT 37.0  --   --   --   --   --   --   --  34.2* 34.1*  PLT 151  --   --   --   --   --   --   --  146* 146*  LABPROT  --   --   --   --   --  15.1  --   --   --   --   INR  --   --   --   --   --  1.17  --   --   --   --   HEPARINUNFRC  --   < >  --   < >  --   --   --  0.53 0.61 0.93*  CREATININE 2.16*  --   --   --   --   --   --   --   --   --   TROPONINI  --   --  7.66*  --  5.91*  --  5.39*  --   --   --   < > = values in this interval not displayed.   Assessment: 75 y.o. F on heparin for r/o ACS. Heparin level up to supratherapeutic on 800 units/hr. CBC stable, no bleeding noted. Cath on Monday.  Goal of Therapy:  Heparin level 0.3-0.7 units/ml   Plan:  Decrease heparin to 650 units/hr Will f/u 8hr heparin level  Christoper Fabian, PharmD, BCPS Clinical pharmacist, pager [phone] 04/23/2016 4:05 AM

## 2016-04-23 NOTE — Progress Notes (Signed)
Pt continues to complain of neck pain where IJ site and now is endorsing difficulty breathing and "tighness in my throat" significant swelling with assocaited firmness noted to right neck and chest. Primary physician Dr. Sharl Ma called with request to contact Rapid response and CCM, both were called. New orders received.

## 2016-04-23 NOTE — Progress Notes (Signed)
eLink Physician-Brief Progress Note Patient Name: Era Smaldone DOB: [birth year] MRN: [id number]   Date of Service  04/23/2016  HPI/Events of Note  Patient c/o pain d/t CVL site hematoma. Dilaudid provides relief for about 2 hours.   eICU Interventions  Will increase frequency of Dilaudid dose to Q 2 hours.      Intervention Category Intermediate Interventions: Pain - evaluation and management  Indigo Barbian Dennard Nip 04/23/2016, 10:39 PM

## 2016-04-23 NOTE — Progress Notes (Addendum)
Called by RN at request of MD.  Patient has right IJ CVC, and site was observed to be swollen and  Firm this am, this extends up patients neck to ear and to the midline of trachea.  Patient is c/o pain in her neck and this has been occuring during the night.  Patient has audible wheeze and states it is getting harder to breath. There is bruising around site which as per RN is not new.  Patient is on heparin and this has been stopped.  Md has been paged and updated, and now at bedside.  VSS RN to call if assistance needed

## 2016-04-24 ENCOUNTER — Encounter (HOSPITAL_COMMUNITY): Admission: AD | Disposition: E | Payer: Self-pay | Source: Other Acute Inpatient Hospital | Attending: Internal Medicine

## 2016-04-24 ENCOUNTER — Inpatient Hospital Stay (HOSPITAL_COMMUNITY): Payer: Medicare Other

## 2016-04-24 DIAGNOSIS — J441 Chronic obstructive pulmonary disease with (acute) exacerbation: Secondary | ICD-10-CM

## 2016-04-24 DIAGNOSIS — S1093XA Contusion of unspecified part of neck, initial encounter: Secondary | ICD-10-CM

## 2016-04-24 DIAGNOSIS — R0789 Other chest pain: Secondary | ICD-10-CM

## 2016-04-24 LAB — BASIC METABOLIC PANEL
Anion gap: 10 (ref 5–15)
BUN: 60 mg/dL — ABNORMAL HIGH (ref 6–20)
CALCIUM: 8.4 mg/dL — AB (ref 8.9–10.3)
CO2: 30 mmol/L (ref 22–32)
CREATININE: 2.21 mg/dL — AB (ref 0.44–1.00)
Chloride: 104 mmol/L (ref 101–111)
GFR calc Af Amer: 24 mL/min — ABNORMAL LOW (ref >60)
GFR, EST NON AFRICAN AMERICAN: 21 mL/min — AB (ref >60)
GLUCOSE: 134 mg/dL — AB (ref 65–99)
Potassium: 3.7 mmol/L (ref 3.5–5.1)
Sodium: 144 mmol/L (ref 135–145)

## 2016-04-24 LAB — CBC
HEMATOCRIT: 34.8 % — AB (ref 36.0–46.0)
Hemoglobin: 11.2 g/dL — ABNORMAL LOW (ref 12.0–15.0)
MCH: 32.6 pg (ref 26.0–34.0)
MCHC: 32.2 g/dL (ref 30.0–36.0)
MCV: 101.2 fL — AB (ref 78.0–100.0)
PLATELETS: 129 10*3/uL — AB (ref 150–400)
RBC: 3.44 MIL/uL — ABNORMAL LOW (ref 3.87–5.11)
RDW: 14.4 % (ref 11.5–15.5)
WBC: 15.4 10*3/uL — ABNORMAL HIGH (ref 4.0–10.5)

## 2016-04-24 LAB — GLUCOSE, CAPILLARY
GLUCOSE-CAPILLARY: 146 mg/dL — AB (ref 65–99)
GLUCOSE-CAPILLARY: 170 mg/dL — AB (ref 65–99)
GLUCOSE-CAPILLARY: 158 mg/dL — AB (ref 65–99)
Glucose-Capillary: 118 mg/dL — ABNORMAL HIGH (ref 65–99)
Glucose-Capillary: 133 mg/dL — ABNORMAL HIGH (ref 65–99)
Glucose-Capillary: 147 mg/dL — ABNORMAL HIGH (ref 65–99)

## 2016-04-24 SURGERY — LEFT HEART CATH AND CORONARY ANGIOGRAPHY

## 2016-04-24 MED ORDER — CARVEDILOL 3.125 MG PO TABS
3.1250 mg | ORAL_TABLET | Freq: Two times a day (BID) | ORAL | Status: DC
  Administered 2016-04-24 – 2016-04-25 (×2): 3.125 mg via ORAL
  Filled 2016-04-24 (×3): qty 1

## 2016-04-24 MED ORDER — METHYLPREDNISOLONE SODIUM SUCC 40 MG IJ SOLR
40.0000 mg | Freq: Two times a day (BID) | INTRAMUSCULAR | Status: DC
  Administered 2016-04-24 – 2016-04-26 (×4): 40 mg via INTRAVENOUS
  Filled 2016-04-24 (×5): qty 1

## 2016-04-24 MED ORDER — PANTOPRAZOLE SODIUM 40 MG PO TBEC
40.0000 mg | DELAYED_RELEASE_TABLET | Freq: Every day | ORAL | Status: DC
Start: 2016-04-24 — End: 2016-04-26
  Administered 2016-04-24 – 2016-04-26 (×3): 40 mg via ORAL
  Filled 2016-04-24 (×3): qty 1

## 2016-04-24 MED ORDER — ISOSORBIDE MONONITRATE ER 60 MG PO TB24
60.0000 mg | ORAL_TABLET | Freq: Every day | ORAL | Status: DC
  Administered 2016-04-24 – 2016-04-25 (×2): 60 mg via ORAL
  Filled 2016-04-24 (×2): qty 1

## 2016-04-24 NOTE — Care Management Important Message (Signed)
Important Message  Patient Details  Name: Calle Rentschler MRN: [id number] Date of Birth: [birth year]   Medicare Important Message Given:  Yes    Kyla Balzarine 04/07/2016, 1:31 PM

## 2016-04-24 NOTE — Progress Notes (Signed)
Cancelled cath with resp. Distress.  Will follow for now.

## 2016-04-24 NOTE — Progress Notes (Signed)
Scheduled 0200 neb not given, pt is sleeping comfortably. PRN neb will be given if needed. O2 sats are stable at this time.

## 2016-04-24 NOTE — Progress Notes (Signed)
PULMONARY / CRITICAL CARE MEDICINE   Name: [name] [name] MRN: [id number] DOB: June 25, 1941    ADMISSION DATE:  04/05/2016 CONSULTATION DATE:  04/23/2016  REFERRING MD:  Viann Fish, Teodora Medici  CHIEF COMPLAINT:  Can't breath  SUBJECTIVE: Heparin gtt was stopped at in AM of 5/21. No chest pain or pressure. Dyspnea & wheezing are improving. No new cough.   REVIEW OF SYSTEMS:  No nausea or emesis. No fever, chills, or sweats.  VITAL SIGNS: BP 150/59 mmHg  Pulse 83  Temp(Src) 97.6 F (36.4 C) (Oral)  Resp 20  Ht  (1.6 m)  Wt 231 lb 7.7 oz (105 kg)  BMI 41.02 kg/m2  SpO2 96%  HEMODYNAMICS:    VENTILATOR SETTINGS:    INTAKE / OUTPUT: I/O last 3 completed shifts: In: 554.3 [P.O.:120; I.V.:434.3] Out: 400 [Urine:400]  PHYSICAL EXAMINATION: General:  No distress. Sleeping until awoken. Now awake & alert. Neuro:  CN in tact grossly. Moving all 4 extremities and following commands. HEENT:  Hematoma Rt neck. Moist mucus membranes. No scleral icterus. Cardiovascular:  Regular rate. No appreciable JVD given body habitus. Lungs:  Symmetrically diminished breath sounds. Normal work of breathing.  Abdomen:  Soft. Protuberant. Nontender.  Integument:  Warm & dry. No rash on exposed skin. Multiple bruises.   LABS:  BMET  Recent Labs Lab 04/06/2016 0520 04/23/16 0228 05/19/2016 0255  NA 142 139 144  K 3.7 3.3* 3.7  CL 99* 95* 104  CO2 BUN 39* 57* 60*  CREATININE 2.16* 2.42* 2.21*  GLUCOSE 157* 179* 134*    Electrolytes  Recent Labs Lab 04/12/2016 0520 04/23/16 0228 05-19-16 0255  CALCIUM 8.3* 8.7* 8.4*    CBC  Recent Labs Lab 04/23/16 0228 04/23/16 0758 05-19-16 0255  WBC 12.4* 12.9* 15.4*  HGB 11.0* 10.9* 11.2*  HCT 34.1* 34.0* 34.8*  PLT 146* 140* 129*    Coag's  Recent Labs Lab 04/17/2016 1240 04/23/16 1115  INR 1.17 1.13    Sepsis Markers No results for input(s): LATICACIDVEN, PROCALCITON, O2SATVEN in the last 168  hours.  ABG  Recent Labs Lab 04/23/16 1028  PHART 7.424  PCO2ART 43.0  PO2ART 87.6    Liver Enzymes No results for input(s): AST, ALT, ALKPHOS, BILITOT, ALBUMIN in the last 168 hours.  Cardiac Enzymes  Recent Labs Lab 04/06/2016 0813 04/10/2016 1236 04/26/2016 2046  TROPONINI 7.66* 5.91* 5.39*    Glucose  Recent Labs Lab 04/23/16 1237 04/23/16 1627 04/23/16 1920 04/23/16 2330 05-19-16 0409 May 19, 2016 0710  GLUCAP 146* 185* 136* 133* 147* 118*    Imaging Dg Chest Port 1 View  2016/05/19  CLINICAL DATA:  Respiratory failure. EXAM: PORTABLE CHEST 1 VIEW COMPARISON:  04/17/2016. FINDINGS: Right IJ line in stable position. Mild left base atelectasis and/or infiltrate. Small left pleural effusion. No pneumothorax. Heart size normal. Right shoulder replacement. IMPRESSION: 1.  Right IJ line in stable position. 2. Mild left base atelectasis and/or infiltrate. Small left pleural effusion. No interim change. Electronically Signed   By: Maisie Fus  Register   On: 19-May-2016 07:13     STUDIES:  5/19 Echo >> mod LVH, EF 65 to 70%, grade 1 diastolic CHF 5/22 Port CXR:  R IJ in good position. No new opacity.  MICROBIOLOGY: MRSA PCR 5/19:  Negative   ANTIBIOTICS: None  SIGNIFICANT EVENTS: 5/19 Admit, cardiology consulted 5/21 Transfer to ICU with respiratory distress from Rt neck hematoma  LINES/TUBES: 5/19 Rt IJ CVL >> PIV x2  ASSESSMENT / PLAN:  PULMONARY  A: Acute Exacerbation of COPD Respiratory Distress Secondary to Right Neck Hematoma Presumed sleep disordered breathing.  P:   Titrate FiO2 to keep SpO2 > 92% Titrate Solu-Medrol to 40mg  q12hr Duoneb q6hr  CARDIOVASCULAR A:  NSTEMI - Cath currently on hold. Hypertensive Urgency - Resolved. Acute on Chronic Diastolic CHF H/O HTN  P:  Cardiology Following Continuing to hold ASA & Heparin gtt w/ hematoma Hold norvasc, lipitor, cardura, hydralazine, labetalol, aldactone pills until able to swallow Prn IV  labetalol, hydralazine to keep SBP < 160  RENAL A:   H/O CKD Stage IV  P:   Trending UOP Monitoring electrolytes & renal function daily  GASTROINTESTINAL A:   H/O GERD  P:   Start Clear Liquid Diet & Ice Chips Protonix IV q24hr  HEMATOLOGIC A:   Hematoma - Secondary to line insertion & anticoagulation. Leukocytosis - Mild. Likely reactive. Anemia - Mild. No obvious ongoing bleeding.  P:  Trending cell counts daily w/ CBC SCDs  INFECTIOUS A:   No evidence for infection.  P:   Monitor for fever  ENDOCRINE A:   Hyperglycemia - BG controlled.   P:   Accu-Checks q4hr SSI per resistant algorithm  NEUROLOGIC A:   H/O CVA H/O Anxiety H/O Depression  P:   Monitor clinically  TODAY'S SUMMARY:  75 yo female admitted with NSTEMI, HTN urgency, and AECOPD.  She has hx of CKD and difficulty IV access.  She had Rt IJ CVL placed 5/19 >> developed Rt neck hematoma with airway compromise and transferred to ICU. Attempting clear liquids & ice chips today. Close monitoring for risk of airway compromise in ICU but may be able to transition to SDU in the next 24 hours.  I have spent a total of 31 minutes of critical care time today caring for this patient and reviewing her electronic medical record.  Donna Christen Jamison Neighbor, M.D. [facility] Pulmonary & Critical Care Pager:  [phone] After 3pm or if no response, call [phone]  05-04-2016, 9:46 AM

## 2016-04-24 NOTE — Progress Notes (Signed)
Pt Profile: 75 year old female for increasing troponin. She has a hx. 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 Despite stopping Lasix and ACE I her renal function progressively worsened to a peak of ~7 (baseline of 1.2-2.5), emergent HD was initiated. Cardiac enzymes were elevated in the absence of EKG changes. ECHO was WNL except for grade 1 diastolic dysfunction. 07/11/14 previous stress test: No evidence for pharmacological induced ischemia. Calculated ejection fraction is 64% no hx of Cardiac cath due to Renal function.   Admitted to [facility] 04/18/16 for acute COPD exacerbation. Was treated and BP systolic was elevated to 240. Her BP per pt was at this level for a day or 2. By the 17-18th she developed chest pain with radiation to bil shoulders and jaw with Troponin 1.04. She was placed on IV NTG and Heparin. EKG on the 18th with more lateral ST depression. She believes this is her 3rd NSTEMI.  pk troponin 7.66  2-3 weeks prior to admit she was taking NTG for bil jaw pain and then chest pain. 2 NTG would usually take care of the pain.   Subjective:  no jaw pain now but some with much activity, moving in bed  Objective: Vital signs in last 24 hours: Temp:  [96 F (35.6 C)-98.4 F (36.9 C)] 96 F (35.6 C) (05/22 1203) Pulse Rate:  [79-100] 83 (05/22 0900) Resp:  [7-20] 20 (05/22 0900) BP: (147-168)/(56-99) 150/59 mmHg (05/22 0900) SpO2:  [87 %-99 %] 96 % (05/22 0900) Weight:  [231 lb 7.7 oz (105 kg)] 231 lb 7.7 oz (105 kg) (05/22 0500) Weight change: -4 lb 10.1 oz (-2.1 kg) Last BM Date: Apr 27, 2016 Intake/Output from previous day:   Intake/Output this shift:    PE:  General:Pleasant affect, NAD Skin:Warm and dry,  brisk capillary refill HEENT:normocephalic, sclera clear, mucus membranes moist Neck:supple, no JVD, no bruits  Heart:S1S2 RRR without murmur, gallup, rub or click Lungs:clear without rales, rhonchi, or wheezes XIH:WTUU, non tender, + BS, do not palpate liver spleen or masses Ext:no lower ext edema, 2+ pedal pulses, 2+ radial pulses Neuro:alert and oriented, MAE, follows commands, + facial symmetry Tele: SR   Lab Results:  Recent Labs  04/23/16 0758 04-27-16 0255  WBC 12.9* 15.4*  HGB 10.9* 11.2*  HCT 34.0* 34.8*  PLT 140* 129*   BMET  Recent Labs  04/23/16 0228 04/27/2016 0255  NA 139 144  K 3.3* 3.7  CL 95* 104  CO2 31 30  GLUCOSE 179* 134*  BUN 57* 60*  CREATININE 2.42* 2.21*  CALCIUM 8.7* 8.4*    Recent Labs  04/15/2016 1236 04/22/2016 2046  TROPONINI 5.91* 5.39*    No results found for: CHOL, HDL, LDLCALC, LDLDIRECT, TRIG, CHOLHDL No results found for: EKCM0L   Lab Results  Component Value Date   TSH 0.89 09/09/2014    Hepatic Function Panel No results for input(s): PROT, ALBUMIN, AST, ALT, ALKPHOS, BILITOT, BILIDIR, IBILI in the last 72 hours. No results for input(s): CHOL in the last 72 hours. No results for input(s): PROTIME in the last 72 hours.     Studies/Results: Dg Neck Soft Tissue  04/23/2016  CLINICAL DATA:  Right neck pain/ swelling EXAM: NECK SOFT TISSUES - 1+ VIEW COMPARISON:  None. FINDINGS:  Prevertebral soft tissues anterior to the upper cervical spine are markedly enlarged on the lateral view, indicating prevertebral edema. On the frontal radiograph, the trachea is deviated to the left. IMPRESSION: Suspected prevertebral edema with leftward tracheal deviation. CT neck with contrast is suggested for further characterization. Electronically Signed   By: Charline Bills M.D.   On: 04/23/2016 09:25   Dg Chest Port 1 View  05-10-2016  CLINICAL DATA:  Respiratory failure. EXAM: PORTABLE CHEST 1 VIEW COMPARISON:  04/16/2016. FINDINGS: Right IJ  line in stable position. Mild left base atelectasis and/or infiltrate. Small left pleural effusion. No pneumothorax. Heart size normal. Right shoulder replacement. IMPRESSION: 1.  Right IJ line in stable position. 2. Mild left base atelectasis and/or infiltrate. Small left pleural effusion. No interim change. Electronically Signed   By: Maisie Fus  Register   On: 10-May-2016 07:13   ECHO: 04/06/2016 ------------------------------------------------------------------- Study Conclusions  - Left ventricle: The cavity size was normal. Wall thickness was  increased in a pattern of moderate LVH. Systolic function was  vigorous. The estimated ejection fraction was in the range of 65%  to 70%. Wall motion was normal; there were no regional wall  motion abnormalities. Doppler parameters are consistent with  abnormal left ventricular relaxation (grade 1 diastolic  dysfunction). - Mitral valve: Calcified annulus. There was mild regurgitation. - Left atrium: The atrium was mildly dilated.  Impressions:  - Normal LV systolic function; moderate LVH; grade 1 diastolic  dysfunction; mild LAE.; mild MR.  Medications: I have reviewed the patient's current medications. Scheduled Meds: . insulin aspart  0-20 Units Subcutaneous Q4H  . ipratropium-albuterol  3 mL Nebulization Q6H  . methylPREDNISolone (SOLU-MEDROL) injection  40 mg Intravenous Q12H  . pantoprazole  40 mg Oral Daily  . potassium chloride  40 mEq Oral Once   Continuous Infusions:  PRN Meds:.[DISCONTINUED] acetaminophen **OR** acetaminophen, albuterol, bisacodyl, hydrALAZINE, HYDROmorphone (DILAUDID) injection, labetalol, LORazepam, [DISCONTINUED] ondansetron **OR** ondansetron (ZOFRAN) IV, polyethylene glycol  Assessment/Plan: Principal Problem:   Chest pain Active Problems:   COPD with acute exacerbation (HCC)   Hypertensive crisis   S/P IVC filter   NSTEMI (non-ST elevated myocardial infarction) (HCC)   Chronic kidney disease  (CKD), stage IV (severe) (HCC)   Depression   Chronic diastolic HF (heart failure), NYHA class 2 (HCC)  NSTEMI May have been due to HTN but she was with jaw pain and some chest pain 2-3 weeks prior to admit. Concerning for CAD. Will check echo, if stable would continue medical therapy. Would prefer to do cath but with elevated Cr. She may develop ESRF and need dialysis. Dr. Herbie Baltimore to see. Currently heparin stopped due to hematoma and ASA stopped. Was on IV NTG but now stopped  Add back imdur. labetolol stopped as well.  Episode of jaw pain last night, develops with much activity.  Added imdur at 60 Had been on 90 mg.  -- on CT of chest in [place] + aortic calcification and calcification in the Lt Main, LAD, and LCX.  Multi vessel CAD  COPD still with wheezes. On solumedrol - yesterday with acute episode improving  Acute on chronic diastolic HF in [place] improved, stopped spironolactone,  lasix 40 mg   HTN improved with meds and IV NTG.  Imdur resumed  CKD stage IV  Cr 2.21  Will re evaluate for cath once she is more stable.   LOS: 3 days   Time spent with pt. : 15 minutes. Nada Boozer  Nurse Practitioner Certified Pager [phone] or after 5pm  and on weekends call [phone] 04/04/2016, 12:25 PM  The patient has been seen in conjunction with Nada Boozer, NP. All aspects of care have been considered and discussed. The patient has been personally interviewed, examined, and all clinical data has been reviewed.   The patient has multiple co-morbidities including CKD 4, Htn, CAD, recurrent NSTEMI, and COPD/Obesity.  Symptoms suggest ongoing angina.  Needs coronary agio but will be high risk due to kidney disease, COPD, obesity, and recent acute IJ bleed with neck hematoma. Agree with medical therapy that includes IMDUR, carvedilol ultra low dose and possibly ranexa.

## 2016-04-25 DIAGNOSIS — I5032 Chronic diastolic (congestive) heart failure: Secondary | ICD-10-CM

## 2016-04-25 DIAGNOSIS — F329 Major depressive disorder, single episode, unspecified: Secondary | ICD-10-CM

## 2016-04-25 LAB — GLUCOSE, CAPILLARY
GLUCOSE-CAPILLARY: 139 mg/dL — AB (ref 65–99)
GLUCOSE-CAPILLARY: 105 mg/dL — AB (ref 65–99)
Glucose-Capillary: 120 mg/dL — ABNORMAL HIGH (ref 65–99)
Glucose-Capillary: 144 mg/dL — ABNORMAL HIGH (ref 65–99)

## 2016-04-25 LAB — RENAL FUNCTION PANEL
ALBUMIN: 3.5 g/dL (ref 3.5–5.0)
Anion gap: 12 (ref 5–15)
BUN: 58 mg/dL — AB (ref 6–20)
CALCIUM: 8.5 mg/dL — AB (ref 8.9–10.3)
CO2: 27 mmol/L (ref 22–32)
CREATININE: 1.65 mg/dL — AB (ref 0.44–1.00)
Chloride: 101 mmol/L (ref 101–111)
GFR calc Af Amer: 34 mL/min — ABNORMAL LOW (ref >60)
GFR, EST NON AFRICAN AMERICAN: 30 mL/min — AB (ref >60)
Glucose, Bld: 118 mg/dL — ABNORMAL HIGH (ref 65–99)
PHOSPHORUS: 4.8 mg/dL — AB (ref 2.5–4.6)
Potassium: 3.9 mmol/L (ref 3.5–5.1)
SODIUM: 140 mmol/L (ref 135–145)

## 2016-04-25 LAB — CBC
HEMATOCRIT: 35 % — AB (ref 36.0–46.0)
Hemoglobin: 11.1 g/dL — ABNORMAL LOW (ref 12.0–15.0)
MCH: 32.7 pg (ref 26.0–34.0)
MCHC: 31.7 g/dL (ref 30.0–36.0)
MCV: 103.2 fL — ABNORMAL HIGH (ref 78.0–100.0)
PLATELETS: 130 10*3/uL — AB (ref 150–400)
RBC: 3.39 MIL/uL — ABNORMAL LOW (ref 3.87–5.11)
RDW: 14.4 % (ref 11.5–15.5)
WBC: 18.7 10*3/uL — AB (ref 4.0–10.5)

## 2016-04-25 MED ORDER — AMLODIPINE BESYLATE 5 MG PO TABS
5.0000 mg | ORAL_TABLET | Freq: Every day | ORAL | Status: DC
  Administered 2016-04-25 – 2016-04-27 (×3): 5 mg via ORAL
  Filled 2016-04-25 (×3): qty 1

## 2016-04-25 MED ORDER — ATORVASTATIN CALCIUM 40 MG PO TABS
40.0000 mg | ORAL_TABLET | Freq: Every day | ORAL | Status: DC
  Administered 2016-04-25 – 2016-05-06 (×10): 40 mg via ORAL
  Filled 2016-04-25 (×12): qty 1

## 2016-04-25 MED ORDER — INSULIN ASPART 100 UNIT/ML ~~LOC~~ SOLN
0.0000 [IU] | Freq: Three times a day (TID) | SUBCUTANEOUS | Status: DC
  Administered 2016-04-25 (×2): 3 [IU] via SUBCUTANEOUS

## 2016-04-25 MED ORDER — FLUTICASONE FUROATE-VILANTEROL 200-25 MCG/INH IN AEPB
1.0000 | INHALATION_SPRAY | Freq: Every day | RESPIRATORY_TRACT | Status: DC
  Administered 2016-04-25 – 2016-04-26 (×2): 1 via RESPIRATORY_TRACT
  Filled 2016-04-25 (×2): qty 28

## 2016-04-25 MED ORDER — SPIRONOLACTONE 25 MG PO TABS
25.0000 mg | ORAL_TABLET | Freq: Every day | ORAL | Status: DC
  Administered 2016-04-25 – 2016-05-03 (×9): 25 mg via ORAL
  Filled 2016-04-25 (×9): qty 1

## 2016-04-25 MED ORDER — IPRATROPIUM-ALBUTEROL 0.5-2.5 (3) MG/3ML IN SOLN
3.0000 mL | Freq: Three times a day (TID) | RESPIRATORY_TRACT | Status: DC
  Administered 2016-04-25 – 2016-04-26 (×4): 3 mL via RESPIRATORY_TRACT
  Filled 2016-04-25 (×4): qty 3

## 2016-04-25 MED ORDER — ISOSORBIDE MONONITRATE ER 60 MG PO TB24
120.0000 mg | ORAL_TABLET | Freq: Every day | ORAL | Status: DC
  Administered 2016-04-26 – 2016-05-09 (×14): 120 mg via ORAL
  Filled 2016-04-25: qty 4
  Filled 2016-04-25: qty 2
  Filled 2016-04-25: qty 4
  Filled 2016-04-25: qty 2
  Filled 2016-04-25: qty 4
  Filled 2016-04-25 (×9): qty 2
  Filled 2016-04-25 (×2): qty 4

## 2016-04-25 NOTE — Progress Notes (Signed)
PULMONARY / CRITICAL CARE MEDICINE   Name: [name] [name] MRN: [id number] DOB: [birth year]    ADMISSION DATE:  04/17/2016 CONSULTATION DATE:  04/23/2016  REFERRING MD:  Viann Fish, Teodora Medici  CHIEF COMPLAINT:  Can't breath  SUBJECTIVE:  Pt reports feeling much better.  Notes neck is sore.  Hematoma softening.  Denies difficulty breathing.  VITAL SIGNS: BP 177/82 mmHg  Pulse 76  Temp(Src) 97.6 F (36.4 C) (Oral)  Resp 11  Ht  (1.6 m)  Wt 231 lb 7.7 oz (105 kg)  BMI 41.02 kg/m2  SpO2 95%  HEMODYNAMICS:    VENTILATOR SETTINGS:    INTAKE / OUTPUT:    PHYSICAL EXAMINATION: General:  Elderly female in NAD Neuro:  CN in tact grossly. Moving all 4 extremities and following commands. HEENT:  Hematoma Rt neck, bruising to neck/chest. Moist mucus membranes. No scleral icterus. Cardiovascular:  Regular rate. No appreciable JVD given body habitus. Lungs:  Symmetrically diminished breath sounds. Normal work of breathing.  Abdomen:  Soft. Protuberant. Nontender.  Integument:  Warm & dry. No rash on exposed skin. Multiple bruises on arms, paper thin skin.   LABS:  BMET  Recent Labs Lab 04/23/16 0228 05/01/2016 0255 04/25/16 0359  NA 139 144 140  K 3.3* 3.7 3.9  CL 95* 104 101  CO2 BUN 57* 60* 58*  CREATININE 2.42* 2.21* 1.65*  GLUCOSE 179* 134* 118*    Electrolytes  Recent Labs Lab 04/23/16 0228 05/01/2016 0255 04/25/16 0359  CALCIUM 8.7* 8.4* 8.5*  PHOS  --   --  4.8*    CBC  Recent Labs Lab 04/23/16 0758 2016/05/01 0255 04/25/16 0356  WBC 12.9* 15.4* 18.7*  HGB 10.9* 11.2* 11.1*  HCT 34.0* 34.8* 35.0*  PLT 140* 129* 130*    Coag's  Recent Labs Lab 05/01/2016 1240 04/23/16 1115  INR 1.17 1.13    Sepsis Markers No results for input(s): LATICACIDVEN, PROCALCITON, O2SATVEN in the last 168 hours.  ABG  Recent Labs Lab 04/23/16 1028  PHART 7.424  PCO2ART 43.0  PO2ART 87.6    Liver Enzymes  Recent Labs Lab 04/25/16 0359   ALBUMIN 3.5    Cardiac Enzymes  Recent Labs Lab 05/03/2016 0813 04/16/2016 1236 04/17/2016 2046  TROPONINI 7.66* 5.91* 5.39*    Glucose  Recent Labs Lab 04/23/16 2330 May 01, 2016 0409 05/01/16 0710 05-01-16 1156 2016-05-01 1551 05-01-16 2056  GLUCAP 133* 147* 118* 146* 170* 158*    Imaging No results found.   STUDIES:  5/19 Echo >> mod LVH, EF 65 to 70%, grade 1 diastolic CHF 5/22 Port CXR:  R IJ in good position. No new opacity.  MICROBIOLOGY: MRSA PCR 5/19:  Negative   ANTIBIOTICS: None  SIGNIFICANT EVENTS: 5/19 Admit, cardiology consulted 5/21 Transfer to ICU with respiratory distress from Rt neck hematoma 5/22 Transferred back to SDU, tolerating liquids  LINES/TUBES: 5/19 Rt IJ CVL >> PIV x2  ASSESSMENT / PLAN:  PULMONARY A: Acute Exacerbation of COPD Respiratory Distress Secondary to Right Neck Hematoma Presumed sleep disordered breathing. P:   Titrate FiO2 to keep SpO2 > 92% Titrate Solu-Medrol to  q12hr Duoneb q6hr  Resume home [facility] on formulary)  CARDIOVASCULAR A:  NSTEMI - Cath currently on hold. Hypertensive Urgency - Resolved. Acute on Chronic Diastolic CHF H/O HTN P:  Cardiology Following Continuing to hold ASA & Heparin gtt w/ hematoma Resume norvasc  QD, lipitor 40 mg, aldactone 25 mg QD Hold cardura, hydralazine, labetalol  PRN  IV labetalol,  hydralazine to keep SBP < 160  RENAL A:   AKI on CKD Stage IV - improving P:   Trending UOP Monitoring electrolytes & renal function daily  GASTROINTESTINAL A:   H/O GERD P:   Clear Liquid Diet & Ice Chips Protonix IV q24hr  HEMATOLOGIC A:   Hematoma - Secondary to line insertion & anticoagulation. Leukocytosis - Mild. Likely reactive. Anemia - Mild. No obvious ongoing bleeding. Thrombocytopenia  P:  Trending cell counts daily w/ CBC SCDs Monitor for further bleeding  INFECTIOUS A:   No evidence for infection. P:   Monitor for fever  ENDOCRINE A:    Hyperglycemia - BG controlled.  P:   Accu-Checks q4hr SSI per resistant algorithm  NEUROLOGIC A:   H/O CVA H/O Anxiety H/O Depression P:   Monitor clinically Supportive care  TODAY'S SUMMARY:  75 yo female admitted with NSTEMI, HTN urgency, and AECOPD.  She has hx of CKD and difficulty IV access.  She had Rt IJ CVL placed 5/19 >> developed Rt neck hematoma with airway compromise and transferred to ICU. Transferred back to SDU 5/22, tolerating clear liquids & ice chips. Close monitoring for risk of airway.    Back to [facility] as of 5/24 am.  PCCM will sign off  Canary Brim, NP-C [place] [facility] Pgr: [phone] or if no answer [phone] 04/25/2016, 8:23 AM   STAFF NOTE: I, Rory Percy, MD FACP have personally reviewed patient's available data, including medical history, events of note, physical examination and test results as part of my evaluation. I have discussed with resident/NP and other care providers such as pharmacist, RN and RRT. In addition, I personally evaluated patient and elicited key findings of: no distress, no pain reported, line clean, no stridor, mild end exp wheezing radiating to lungs, extensive bruising but less tense rt neck, pcxr with int edema, maintain BDers and steroids, reduce roids in am and to off over 1 week, neg baklance when able, BP control, no anticoagulation, will sign off as she is resolving the hematoma clinically, call if needed   Mcarthur Rossetti. Tyson Alias, MD, FACP Pgr: [phone] [place] [facility] 04/25/2016 11:29 AM

## 2016-04-25 NOTE — Progress Notes (Signed)
       Patient Name: [name] [name] Date of Encounter: 04/25/2016    SUBJECTIVE: She denies chest pain. Wheezing more this AM. Still gets tight is chest and neck if recumbent.  TELEMETRY:  NSR Filed Vitals:   04/25/16 0325 04/25/16 0341 04/25/16 0705 04/25/16 0815  BP:  175/70 177/82   Pulse:  84 76   Temp: 97.6 F (36.4 C)     TempSrc: Oral     Resp:  11 11   Height:      Weight:      SpO2:  97% 96% 95%   No intake or output data in the 24 hours ending 04/25/16 0933 LABS: Basic Metabolic Panel:  Recent Labs  82/64/15 0255 04/25/16 0359  NA 144 140  K 3.7 3.9  CL 104 101  CO2 30 27  GLUCOSE 134* 118*  BUN 60* 58*  CREATININE 2.21* 1.65*  CALCIUM 8.4* 8.5*  PHOS  --  4.8*   CBC:  Recent Labs  05/01/2016 0255 04/25/16 0356  WBC 15.4* 18.7*  HGB 11.2* 11.1*  HCT 34.8* 35.0*  MCV 101.2* 103.2*  PLT 129* 130*     Radiology/Studies:  CXR with no interval change  Physical Exam: Blood pressure 177/82, pulse 76, temperature 97.6 F (36.4 C), temperature source Oral, resp. rate 11, height 5\' 3"  (1.6 m), weight 231 lb 7.7 oz (105 kg), SpO2 95 %. Weight change:   Wt Readings from Last 3 Encounters:  05/01/2016 231 lb 7.7 oz (105 kg)  01/05/16 210 lb (95.255 kg)  10/25/15 215 lb 12.8 oz (97.886 kg)    Diffuse wheezes  ASSESSMENT:  1. CAD with angina 2. COPD with wheezing this AM. Possibly made worse by low dose beta blocker added yesterday 3. A/CKD - improving  Plan:  1. Discontinue carvedilol. 2. Cath when more stable.  Signed, Lyn Records III 04/25/2016, 9:33 AM

## 2016-04-25 NOTE — Care Management Note (Addendum)
Case Management Note  Patient Details  Name: [name] [name] MRN: [id number] Date of Birth: 1941-04-07  Subjective/Objective:  Patient is from home alone, presents with NSTEMI, R  IJ Bleed, and CKD.  NCM will cont to follow for dc needs.     5/24- Patient states feels like something is stuck in her throat and  She feels sob, has R IJ neck hematomoa, she is wheezing, bp 180's, patient transferred to ICU.  NCM will cont to follow for dc needs.                Action/Plan:   Expected Discharge Date:  04/25/16               Expected Discharge Plan:  Home w Home Health Services  In-House Referral:     Discharge planning Services  CM Consult  Post Acute Care Choice:    Choice offered to:     DME Arranged:    DME Agency:     HH Arranged:    HH Agency:     Status of Service:  In process, will continue to follow  Medicare Important Message Given:  Yes Date Medicare IM Given:    Medicare IM give by:    Date Additional Medicare IM Given:    Additional Medicare Important Message give by:     If discussed at Long Length of Stay Meetings, dates discussed:    Additional Comments:  Leone Haven, RN 04/25/2016, 8:33 PM

## 2016-04-26 ENCOUNTER — Inpatient Hospital Stay (HOSPITAL_COMMUNITY): Payer: Medicare Other

## 2016-04-26 DIAGNOSIS — N179 Acute kidney failure, unspecified: Secondary | ICD-10-CM

## 2016-04-26 DIAGNOSIS — J9601 Acute respiratory failure with hypoxia: Secondary | ICD-10-CM | POA: Insufficient documentation

## 2016-04-26 DIAGNOSIS — N184 Chronic kidney disease, stage 4 (severe): Secondary | ICD-10-CM

## 2016-04-26 DIAGNOSIS — R062 Wheezing: Secondary | ICD-10-CM

## 2016-04-26 LAB — CBC
HEMATOCRIT: 33.3 % — AB (ref 36.0–46.0)
Hemoglobin: 10.8 g/dL — ABNORMAL LOW (ref 12.0–15.0)
MCH: 32.5 pg (ref 26.0–34.0)
MCHC: 32.4 g/dL (ref 30.0–36.0)
MCV: 100.3 fL — AB (ref 78.0–100.0)
Platelets: 133 10*3/uL — ABNORMAL LOW (ref 150–400)
RBC: 3.32 MIL/uL — ABNORMAL LOW (ref 3.87–5.11)
RDW: 14.4 % (ref 11.5–15.5)
WBC: 14 10*3/uL — AB (ref 4.0–10.5)

## 2016-04-26 LAB — POCT I-STAT 3, ART BLOOD GAS (G3+)
ACID-BASE EXCESS: 6 mmol/L — AB (ref 0.0–2.0)
Bicarbonate: 28.8 mEq/L — ABNORMAL HIGH (ref 20.0–24.0)
O2 Saturation: 94 %
TCO2: 30 mmol/L (ref 0–100)
pCO2 arterial: 36.5 mmHg (ref 35.0–45.0)
pH, Arterial: 7.506 — ABNORMAL HIGH (ref 7.350–7.450)
pO2, Arterial: 65 mmHg — ABNORMAL LOW (ref 80.0–100.0)

## 2016-04-26 LAB — GLUCOSE, CAPILLARY
GLUCOSE-CAPILLARY: 163 mg/dL — AB (ref 65–99)
GLUCOSE-CAPILLARY: 126 mg/dL — AB (ref 65–99)
Glucose-Capillary: 149 mg/dL — ABNORMAL HIGH (ref 65–99)

## 2016-04-26 LAB — RENAL FUNCTION PANEL
Albumin: 3.4 g/dL — ABNORMAL LOW (ref 3.5–5.0)
Anion gap: 9 (ref 5–15)
BUN: 53 mg/dL — AB (ref 6–20)
CHLORIDE: 101 mmol/L (ref 101–111)
CO2: 28 mmol/L (ref 22–32)
Calcium: 8.7 mg/dL — ABNORMAL LOW (ref 8.9–10.3)
Creatinine, Ser: 1.56 mg/dL — ABNORMAL HIGH (ref 0.44–1.00)
GFR, EST AFRICAN AMERICAN: 37 mL/min — AB (ref >60)
GFR, EST NON AFRICAN AMERICAN: 32 mL/min — AB (ref >60)
Glucose, Bld: 136 mg/dL — ABNORMAL HIGH (ref 65–99)
POTASSIUM: 4.6 mmol/L (ref 3.5–5.1)
Phosphorus: 4.1 mg/dL (ref 2.5–4.6)
Sodium: 138 mmol/L (ref 135–145)

## 2016-04-26 MED ORDER — DEXAMETHASONE SODIUM PHOSPHATE 4 MG/ML IJ SOLN
10.0000 mg | Freq: Four times a day (QID) | INTRAMUSCULAR | Status: DC
  Administered 2016-04-26: 10 mg via INTRAVENOUS
  Filled 2016-04-26: qty 3
  Filled 2016-04-26: qty 2.5

## 2016-04-26 MED ORDER — INSULIN ASPART 100 UNIT/ML ~~LOC~~ SOLN: 0.0000 [IU] | Freq: Every day | SUBCUTANEOUS | Status: DC

## 2016-04-26 MED ORDER — DEXAMETHASONE SODIUM PHOSPHATE 10 MG/ML IJ SOLN
10.0000 mg | Freq: Four times a day (QID) | INTRAMUSCULAR | Status: DC
  Administered 2016-04-26 – 2016-04-27 (×3): 10 mg via INTRAVENOUS
  Filled 2016-04-26 (×3): qty 1

## 2016-04-26 MED ORDER — INSULIN ASPART 100 UNIT/ML ~~LOC~~ SOLN
0.0000 [IU] | SUBCUTANEOUS | Status: DC
  Administered 2016-04-26 – 2016-04-27 (×5): 3 [IU] via SUBCUTANEOUS
  Administered 2016-04-27: 4 [IU] via SUBCUTANEOUS
  Administered 2016-04-28 (×2): 3 [IU] via SUBCUTANEOUS

## 2016-04-26 MED ORDER — PANTOPRAZOLE SODIUM 40 MG IV SOLR: 40.0000 mg | INTRAVENOUS | Status: DC

## 2016-04-26 MED ORDER — IPRATROPIUM-ALBUTEROL 0.5-2.5 (3) MG/3ML IN SOLN
3.0000 mL | Freq: Four times a day (QID) | RESPIRATORY_TRACT | Status: DC
  Administered 2016-04-26 – 2016-05-03 (×27): 3 mL via RESPIRATORY_TRACT
  Filled 2016-04-26 (×28): qty 3

## 2016-04-26 MED ORDER — METHYLPREDNISOLONE SODIUM SUCC 125 MG IJ SOLR
60.0000 mg | Freq: Four times a day (QID) | INTRAMUSCULAR | Status: DC
Start: 2016-04-26 — End: 2016-04-26

## 2016-04-26 MED ORDER — INSULIN ASPART 100 UNIT/ML ~~LOC~~ SOLN
0.0000 [IU] | Freq: Three times a day (TID) | SUBCUTANEOUS | Status: DC
  Administered 2016-04-26: 3 [IU] via SUBCUTANEOUS

## 2016-04-26 MED ORDER — HYDRALAZINE HCL 50 MG PO TABS
50.0000 mg | ORAL_TABLET | Freq: Three times a day (TID) | ORAL | Status: DC
  Administered 2016-04-26 – 2016-05-07 (×29): 50 mg via ORAL
  Filled 2016-04-26 (×31): qty 1

## 2016-04-26 MED ORDER — PANTOPRAZOLE SODIUM 40 MG IV SOLR
40.0000 mg | INTRAVENOUS | Status: DC
  Administered 2016-04-27: 40 mg via INTRAVENOUS
  Filled 2016-04-26: qty 40

## 2016-04-26 NOTE — Progress Notes (Signed)
Upon assessment this am, pt with audible upper airway expiratory wheezing. 02 saturation maintaining  92-96 on 2L [place]. BP180/7's. PRN hydralazine given. MD Tat notified. Will continue to monitor.

## 2016-04-26 NOTE — Progress Notes (Signed)
       Patient Name: [name] [name] Date of Encounter: 04/26/2016    SUBJECTIVE:Denies chest pain. Just moved to unit due to increased respiratory disctress and wheezing thought upper airway.  TELEMETRY:  NSR Filed Vitals:   04/26/16 1248 04/26/16 1300 04/26/16 1315 04/26/16 1330  BP:  179/84 179/73 167/66  Pulse:  91 94 101  Temp:      TempSrc:      Resp:  15 16 19   Height:      Weight:      SpO2: 95% 98% 98% 96%    Intake/Output Summary (Last 24 hours) at 04/26/16 1348 Last data filed at 04/26/16 0025  Gross per 24 hour  Intake      0 ml  Output    825 ml  Net   -825 ml   LABS: Basic Metabolic Panel:  Recent Labs  82/42/35 0359 04/26/16 0440  NA 140 138  K 3.9 4.6  CL 101 101  CO2 27 28  GLUCOSE 118* 136*  BUN 58* 53*  CREATININE 1.65* 1.56*  CALCIUM 8.5* 8.7*  PHOS 4.8* 4.1   CBC:  Recent Labs  04/25/16 0356 04/26/16 0440  WBC 18.7* 14.0*  HGB 11.1* 10.8*  HCT 35.0* 33.3*  MCV 103.2* 100.3*  PLT 130* 133*     Radiology/Studies:  Neck X ray- resolving hematoma  Physical Exam: Blood pressure 167/66, pulse 101, temperature 97.5 F (36.4 C), temperature source Oral, resp. rate 19, height 5\' 3"  (1.6 m), weight 233 lb 11 oz (106 kg), SpO2 96 %. Weight change:   Wt Readings from Last 3 Encounters:  04/26/16 233 lb 11 oz (106 kg)  01/05/16 210 lb (95.255 kg)  10/25/15 215 lb 12.8 oz (97.886 kg)   Marked ecchymosis in neck Faint expiratory wheezing. No murmur or gallop.  ASSESSMENT:  1. CAD, stable without angina 2. COPD, with dyspnea and wheezing, better than yesterday. 3. CKD  Plan:  Continue to follow for CAD. No new recommendations.  Signed, Lyn Records III 04/26/2016, 1:48 PM

## 2016-04-26 NOTE — Progress Notes (Signed)
Attempted to call daughter Avelina Laine to give update concerning pt's move to ICU, no answer and no voicemail. Will attempt again.

## 2016-04-26 NOTE — Progress Notes (Signed)
Pt stating she can't breath, and it feels like something is stuck in her throat. 02 @ 96% on 2L . Upon assessment stridor audible. MD Tat, and CCM notified.

## 2016-04-26 NOTE — Progress Notes (Signed)
PULMONARY / CRITICAL CARE MEDICINE   Name: [name] [name] MRN: [id number] DOB: [birth year]    ADMISSION DATE:  25-Apr-2016 CONSULTATION DATE:  04/23/2016  REFERRING MD:  Viann Fish, Teodora Medici  CHIEF COMPLAINT:  Can't breath  SUBJECTIVE:  Worsening status this am , increase wheezing expiration.  VITAL SIGNS: BP 179/73 mmHg  Pulse 91  Temp(Src) 97.5 F (36.4 C) (Oral)  Resp 18  Ht 5\' 3"  (1.6 m)  Wt 105 kg (231 lb 7.7 oz)  BMI 41.02 kg/m2  SpO2 96%  HEMODYNAMICS:    VENTILATOR SETTINGS:    INTAKE / OUTPUT: I/O last 3 completed shifts: In: -  Out: 825 [Urine:825]  PHYSICAL EXAMINATION: General:  Elderly female in  mild distress Neuro:  CN in tact grossly. Moving all 4 extremities and following commands. HEENT:  Hematoma Rt neck reduced, less fullness, has exp wheezing moderate with no sig radiation to lungs, NO stridor Cardiovascular: s1 s2 rrr Lungs: CTA  Abdomen:  Soft. Protuberant. Nontender.  Integument:  Warm & dry. No rash on exposed skin. Multiple bruises on arms  LABS:  BMET  Recent Labs Lab 04/19/2016 0255 04/25/16 0359 04/26/16 0440  NA 144 140 138  K 3.7 3.9 4.6  CL 104 101 101  CO2 30 27 28   BUN 60* 58* 53*  CREATININE 2.21* 1.65* 1.56*  GLUCOSE 134* 118* 136*    Electrolytes  Recent Labs Lab 04/07/2016 0255 04/25/16 0359 04/26/16 0440  CALCIUM 8.4* 8.5* 8.7*  PHOS  --  4.8* 4.1    CBC  Recent Labs Lab 05/01/2016 0255 04/25/16 0356 04/26/16 0440  WBC 15.4* 18.7* 14.0*  HGB 11.2* 11.1* 10.8*  HCT 34.8* 35.0* 33.3*  PLT 129* 130* 133*    Coag's  Recent Labs Lab 2016/04/25 1240 04/23/16 1115  INR 1.17 1.13    Sepsis Markers No results for input(s): LATICACIDVEN, PROCALCITON, O2SATVEN in the last 168 hours.  ABG  Recent Labs Lab 04/23/16 1028  PHART 7.424  PCO2ART 43.0  PO2ART 87.6    Liver Enzymes  Recent Labs Lab 04/25/16 0359 04/26/16 0440  ALBUMIN 3.5 3.4*    Cardiac Enzymes  Recent Labs Lab  25-Apr-2016 0813 Apr 25, 2016 1236 Apr 25, 2016 2046  TROPONINI 7.66* 5.91* 5.39*    Glucose  Recent Labs Lab 04/13/2016 2056 04/25/16 0824 04/25/16 1211 04/25/16 1707 04/25/16 2111 04/26/16 0807  GLUCAP 158* 139* 120* 144* 105* 163*    Imaging Dg Chest Port 1 View  04/26/2016  CLINICAL DATA:  Acute respiratory failure with hypoxia EXAM: PORTABLE CHEST - 1 VIEW COMPARISON:  04/06/2016 FINDINGS: Stable right IJ central venous catheter. Lungs are clear. Improved aeration of the left lung base. Heart size and mediastinal contours are within normal limits. No effusion evident. Right shoulder arthroplasty hardware partially visualized. IMPRESSION: Improved aeration at the left lung base. Electronically Signed   By: Corlis Leak M.D.   On: 04/26/2016 08:45     STUDIES:  5/19 Echo >> mod LVH, EF 65 to 70%, grade 1 diastolic CHF 5/22 Port CXR:  R IJ in good position. No new opacity.  MICROBIOLOGY: MRSA PCR 5/19:  Negative   ANTIBIOTICS: None  SIGNIFICANT EVENTS: 5/19 Admit, cardiology consulted 5/21 Transfer to ICU with respiratory distress from Rt neck hematoma 5/22 Transferred back to SDU, tolerating  5/24- worseing status resp failure, back to ICU  LINES/TUBES: 5/19 Rt IJ CVL >> PIV x2  ASSESSMENT / PLAN:  PULMONARY A: Acute Exacerbation of COPD Respiratory Distress Secondary to Right Neck Hematoma R/o NEW  ASPIRATION EVENT 5/24 Presumed sleep disordered breathing P:   Titrate FiO2 to keep SpO2 > 92% ABG STAT Move to icu BDrs NO racemic epi as recent MI and no stridor Change solumedral to decadon, higher dose STAT xray neck and chest NPO strict Needs SLP May need repeat CT neck Monitor in icu for intubation  CARDIOVASCULAR A:  NSTEMI - Cath currently on hold. Hypertensive Urgency - Resolved. Acute on Chronic Diastolic CHF H/O HTN P:  Cardiology Following Continuing to hold ASA & Heparin gtt w/ hematoma norvasc  QD, lipitor 40 mg, aldactone 25 mg QD No  racemic epi with recent MI  RENAL A:   AKI on CKD Stage IV - improving P:   Chem in am kvo Even balance goals  GASTROINTESTINAL A:   H/O GERD R/o dysphagia, aspiration P:   NPO strict SLP Protonix IV q24hr  HEMATOLOGIC A:   Hematoma - Secondary to line insertion & anticoagulation- improved clinically Anemia - Mild. No obvious ongoing bleeding. Thrombocytopenia  Improved overall P:  Am CBC SCDs  INFECTIOUS A:   No evidence for infection. P:   Monitor for fever If spike add zosyn\ Get pcxr  ENDOCRINE A:   Hyperglycemia - BG controlled.  P:   Accu-Checks q4hr SSI per resistant algorithm  NEUROLOGIC A:   H/O CVA H/O Anxiety H/O Depression P:   Monitor clinically abg  Ccm time 35 min  Mcarthur Rossetti. Tyson Alias, MD, FACP Pgr: [phone] [place] [facility] 04/26/2016 11:02 AM

## 2016-04-26 NOTE — Progress Notes (Signed)
PCCM Attending Note:  Patient with only minimal upper airway wheezing. Comfortable. Mallampati Class II. Appears comfortable & able to swallow easily. Reports breathing improving. CXR & Neck X-ray reviewed. Exam shows some regression in hematoma. Checking CT neck w/o stat to evaluate airway and size of hematoma. Continue NPO.   Donna Christen Jamison Neighbor, M.D. [facility] Pager:  [phone] After 3pm or if no response, call [phone] 1:43 PM 04/26/2016

## 2016-04-26 NOTE — Progress Notes (Signed)
Spoke to MD about NPO status. VO ok to give clear liquids with ice chips with medication. Family updated at the bedside by MD

## 2016-04-26 NOTE — Progress Notes (Signed)
PROGRESS NOTE  [name] [name] TRV:[id number] DOB: Nov 02, 1941 DOA: 05-May-2016 PCP: Konrad Felix, MD  Brief History:  75 year old female  with history of COPD, CKD, diast CHF, angina/ MI x 2 (no hx cath) presented to [facility] on 5/16 with SOB and acute/ chron respiratory failure. She was treated for COPD exacerbation. BP's was high w SBP's in the 240's according to the patient, and on 5/17-18 she developed severe chest pain w radiation into bilat shoulders / jaw. Trop was up at 1.04. EKG showed some anterolateral ST depression. They were concerned about aortic dissection but couldn't give dye due to CKD and didn't have MRA so pt was transferred to [facility]. The patient was transferred to [facility].  She arrived on IV NTG and IV heparin.  Troponin peaked at 7.66.  Cardiology was consulted for NSTEMI. She was continued on intravenous heparin, intravenous furosemide for pulmonary edema. She was continued on intravenous Solu-Medrol for COPD exacerbation. On 05/05/2016, right IJ central line was placed. Unfortunately, the patient developed a right neck hematoma with airway compromise. On 04/23/2016, she was transferred to the ICU secondary to respiratory distress from her right neck hematoma. Aspirin and heparin were held. The patient's right neck hematoma became less tense, and she was stabilized. The patient was transferred to [facility] on 04/26/16. Assessment/Plan: Acute respiratory failure with hypoxia -Secondary to COPD exacerbation and development of right neck hematoma -There is a presumed underlying sleepiness order -Wean oxygen for oxygen saturation > 92% -Pulmonary hygiene  Acute COPD exacerbation -Solu-Medrol--increase to 60 mg IV q 6 hrs as pt with increase wheeze and SOB -Duonebs to q 6 hrs -remain on 2L and wean for sat >92% -?wheezing from labetalol and/or "cardiac asthma" -Resumed home Breo -CXR today  NSTEMI -appreciate cardiology followup -Cath delayed until pt more stable -ASA  and heparin on hold secondary to R-neck hematoma -2016-05-05 echo--EF 65-70%, grade 1 DD, no WMA  Acute on chronic diastolic CHF - IV lasix--discontinued after 04/22/2016 dose due to worsening renal function -daily weights -strict I/O  Acute on chronic renal failure (CKD 4) -monitor daily -improving off lasix  R-neck hematoma and thrombocytopenia -trend counts daily -SCDs -advance diet to full liquids  HTN Urgency -resumed amlodipine 5 mg daily, aldactone 25 mg daily -restart hydralazine 50mg  q 8 hrs  DM2 -check A1C -SSI--resistant scale    Disposition Plan:   Not stable for DC; remain in stepdown Family Communication:   NoFamily at beside  Consultants:  Cardiology, PCCM  Code Status:  FULL    Subjective: Patient feels a little more short of breath today. Denies any nausea, vomiting, diarrhea, melena pain, headache, neck pain, fevers, chills. No dysuria or hematuria. No rashes. She is able to swallow without difficulty.  Objective: Filed Vitals:   04/25/16 2253 04/25/16 2255 04/26/16 0225 04/26/16 0248  BP:  185/78 168/71   Pulse:  86 78   Temp: 97.8 F (36.6 C)   97.6 F (36.4 C)  TempSrc: Oral   Oral  Resp:  15 9   Height:      Weight:      SpO2:  96% 91%     Intake/Output Summary (Last 24 hours) at 04/26/16 0705 Last data filed at 04/26/16 0025  Gross per 24 hour  Intake      0 ml  Output    825 ml  Net   -825 ml   Weight change:  Exam:   General:  Pt is alert,  follows commands appropriately, not in acute distress  HEENT: No icterus, No thrush, No neck mass, [place]; ecchymosis from upper neck to manubrium without any active drainage. Area is soft.  Cardiovascular: RRR, S1/S2, no rubs, no gallops  Respiratory: Bilateral expiratory wheeze. Good air movement.  Abdomen: Soft/+BS, non tender, non distended, no guarding  Extremities: trace LE edema, No lymphangitis, No petechiae, No rashes, no synovitis   Data Reviewed: I have personally reviewed  following labs and imaging studies Basic Metabolic Panel:  Recent Labs Lab 04/04/2016 0520 04/23/16 0228 2016-04-29 0255 04/25/16 0359 04/26/16 0440  NA 142 139 144 140 138  K 3.7 3.3* 3.7 3.9 4.6  CL 99* 95* 104 101 101  CO2 GLUCOSE 157* 179* 134* 118* 136*  BUN 39* 57* 60* 58* 53*  CREATININE 2.16* 2.42* 2.21* 1.65* 1.56*  CALCIUM 8.3* 8.7* 8.4* 8.5* 8.7*  PHOS  --   --   --  4.8* 4.1   Liver Function Tests:  Recent Labs Lab 04/25/16 0359 04/26/16 0440  ALBUMIN 3.5 3.4*   No results for input(s): LIPASE, AMYLASE in the last 168 hours. No results for input(s): AMMONIA in the last 168 hours. Coagulation Profile:  Recent Labs Lab 04/17/2016 1240 04/23/16 1115  INR 1.17 1.13   CBC:  Recent Labs Lab 04/23/16 0228 04/23/16 0758 04-29-16 0255 04/25/16 0356 04/26/16 0440  WBC 12.4* 12.9* 15.4* 18.7* 14.0*  HGB 11.0* 10.9* 11.2* 11.1* 10.8*  HCT 34.1* 34.0* 34.8* 35.0* 33.3*  MCV 100.6* 100.6* 101.2* 103.2* 100.3*  PLT 146* 140* 129* 130* 133*   Cardiac Enzymes:  Recent Labs Lab 04/17/2016 0813 04/05/2016 1236 04/08/2016 2046  TROPONINI 7.66* 5.91* 5.39*   BNP: Invalid input(s): POCBNP CBG:  Recent Labs Lab 04-29-2016 2056 04/25/16 0824 04/25/16 1211 04/25/16 1707 04/25/16 2111  GLUCAP 158* 139* 120* 144* 105*   HbA1C: No results for input(s): HGBA1C in the last 72 hours. Urine analysis:    Component Value Date/Time   COLORURINE YELLOW 07/06/2014 0112   APPEARANCEUR CLOUDY* 07/06/2014 0112   LABSPEC 1.019 07/06/2014 0112   PHURINE 5.0 07/06/2014 0112   GLUCOSEU NEGATIVE 07/06/2014 0112   HGBUR NEGATIVE 07/06/2014 0112   BILIRUBINUR NEGATIVE 07/06/2014 0112   KETONESUR NEGATIVE 07/06/2014 0112   PROTEINUR NEGATIVE 07/06/2014 0112   UROBILINOGEN 0.2 07/06/2014 0112   NITRITE NEGATIVE 07/06/2014 0112   LEUKOCYTESUR MODERATE* 07/06/2014 0112   Sepsis Labs: (procalcitonin:4,lacticidven:4) ) Recent Results (from the past  240 hour(s))  MRSA PCR Screening     Status: None   Collection Time: 04/07/2016  1:52 AM  Result Value Ref Range Status   MRSA by PCR NEGATIVE NEGATIVE Final    Comment:        The GeneXpert MRSA Assay (FDA approved for NASAL specimens only), is one component of a comprehensive MRSA colonization surveillance program. It is not intended to diagnose MRSA infection nor to guide or monitor treatment for MRSA infections.      Scheduled Meds: . amLODipine  5 mg Oral Daily  . atorvastatin  40 mg Oral q1800  . fluticasone furoate-vilanterol  1 puff Inhalation Daily  . insulin aspart  0-20 Units Subcutaneous TID WC  . ipratropium-albuterol  3 mL Nebulization TID  . isosorbide mononitrate  120 mg Oral Daily  . methylPREDNISolone (SOLU-MEDROL) injection  40 mg Intravenous Q12H  . pantoprazole  40 mg Oral Daily  . potassium chloride  40 mEq Oral Once  . spironolactone  25 mg Oral  Daily   Continuous Infusions:   Procedures/Studies: Dg Neck Soft Tissue  04/23/2016  CLINICAL DATA:  Right neck pain/ swelling EXAM: NECK SOFT TISSUES - 1+ VIEW COMPARISON:  None. FINDINGS: Prevertebral soft tissues anterior to the upper cervical spine are markedly enlarged on the lateral view, indicating prevertebral edema. On the frontal radiograph, the trachea is deviated to the left. IMPRESSION: Suspected prevertebral edema with leftward tracheal deviation. CT neck with contrast is suggested for further characterization. Electronically Signed   By: Charline Bills M.D.   On: 04/23/2016 09:25   Dg Chest 1 View  05-12-2016  CLINICAL DATA:  Central line placement EXAM: CHEST 1 VIEW COMPARISON:  04/18/2016 chest radiograph. FINDINGS: Right internal jugular central venous catheter terminates in the middle third of the superior vena cava. Stable cardiomediastinal silhouette with top-normal heart size. No pneumothorax. No right pleural effusion. No overt pulmonary edema. Small left pleural effusion, probably stable.  Mild left basilar atelectasis. IMPRESSION: 1. Right internal jugular central venous catheter terminates in the middle third of the superior vena cava. No pneumothorax. 2. Stable small left pleural effusion with mild left basilar atelectasis. Electronically Signed   By: Delbert Phenix M.D.   On: 12-May-2016 15:48   Dg Chest Port 1 View  04/10/2016  CLINICAL DATA:  Respiratory failure. EXAM: PORTABLE CHEST 1 VIEW COMPARISON:  05/12/2016. FINDINGS: Right IJ line in stable position. Mild left base atelectasis and/or infiltrate. Small left pleural effusion. No pneumothorax. Heart size normal. Right shoulder replacement. IMPRESSION: 1.  Right IJ line in stable position. 2. Mild left base atelectasis and/or infiltrate. Small left pleural effusion. No interim change. Electronically Signed   By: Maisie Fus  Register   On: 04/05/2016 07:13    Miral Hoopes, DO  [facility] Pager [phone]  If 7PM-7AM, please contact night-coverage [email or web address] Password [email or web address] 04/26/2016, 7:05 AM   LOS: 5 days

## 2016-04-26 NOTE — Progress Notes (Signed)
eLink Physician-Brief Progress Note Patient Name: [name] [name] DOB: 11/22/1941 MRN: [id number]   Date of Service  04/26/2016  HPI/Events of Note  CT Scan of neck reveals the airway and pharanx to be patent. Patient of AC/HS resistant Novolog SSI.  eICU Interventions  Will order: 1. Ice chips and sips with meds. 2. D/C AC/HS resistant Novolog. 2. Q 4 hour resistant Novolog SSI.     Intervention Category Intermediate Interventions: Diagnostic test evaluation  [name] 04/26/2016, 8:44 PM

## 2016-04-26 NOTE — Progress Notes (Signed)
Pt arrived on the floor. Alert, oriented, denies any pain. C/o "feeling SOB". Wheezing bilaterally heard on apexes of the lungs. Dr. Bradly Bienenstock called. VO given and placed. Xray and RT at the bedside at the bedside. Family updated and at the bedside. Bed in low position, alarms are on, call bell in reach.

## 2016-04-26 NOTE — Progress Notes (Signed)
Daughter Nettie Elm at bedside. Updates given. Will continue to monitor.

## 2016-04-26 NOTE — Progress Notes (Signed)
Came to bedside to eval pt at 1045 from RN page due to continued stridor. 96-97% on 2L Pt c/o "feeling like something stuck in throat" but swallowed pills this am without vomiting. -make pt NPO -swallow eval -discussed with Dr. Langley Gauss back ti ICU -continue IV steroids  Change meds to IV where able  CV--RRR Lung--upper airway wheeze,  Lower lobe exp wheeze Neck--ecchymotic but soft  DTat

## 2016-04-26 NOTE — Care Management Important Message (Signed)
Important Message  Patient Details  Name: [name] [name] MRN: [id number] Date of Birth: [birth year]   Medicare Important Message Given:  Yes    Bernadette Hoit 04/26/2016, 11:22 AM

## 2016-04-27 DIAGNOSIS — F419 Anxiety disorder, unspecified: Secondary | ICD-10-CM | POA: Insufficient documentation

## 2016-04-27 LAB — RENAL FUNCTION PANEL
Albumin: 3.5 g/dL (ref 3.5–5.0)
Anion gap: 8 (ref 5–15)
BUN: 53 mg/dL — ABNORMAL HIGH (ref 6–20)
CO2: 29 mmol/L (ref 22–32)
Calcium: 8.9 mg/dL (ref 8.9–10.3)
Chloride: 104 mmol/L (ref 101–111)
Creatinine, Ser: 1.65 mg/dL — ABNORMAL HIGH (ref 0.44–1.00)
GFR calc Af Amer: 34 mL/min — ABNORMAL LOW (ref >60)
GFR calc non Af Amer: 30 mL/min — ABNORMAL LOW (ref >60)
Glucose, Bld: 147 mg/dL — ABNORMAL HIGH (ref 65–99)
Phosphorus: 5.2 mg/dL — ABNORMAL HIGH (ref 2.5–4.6)
Potassium: 5 mmol/L (ref 3.5–5.1)
Sodium: 141 mmol/L (ref 135–145)

## 2016-04-27 LAB — GLUCOSE, CAPILLARY
GLUCOSE-CAPILLARY: 113 mg/dL — AB (ref 65–99)
GLUCOSE-CAPILLARY: 135 mg/dL — AB (ref 65–99)
GLUCOSE-CAPILLARY: 140 mg/dL — AB (ref 65–99)
GLUCOSE-CAPILLARY: 147 mg/dL — AB (ref 65–99)
Glucose-Capillary: 138 mg/dL — ABNORMAL HIGH (ref 65–99)
Glucose-Capillary: 160 mg/dL — ABNORMAL HIGH (ref 65–99)

## 2016-04-27 LAB — CBC
HCT: 34 % — ABNORMAL LOW (ref 36.0–46.0)
HEMOGLOBIN: 10.9 g/dL — AB (ref 12.0–15.0)
MCH: 33.1 pg (ref 26.0–34.0)
MCHC: 32.1 g/dL (ref 30.0–36.0)
MCV: 103.3 fL — ABNORMAL HIGH (ref 78.0–100.0)
PLATELETS: 128 10*3/uL — AB (ref 150–400)
RBC: 3.29 MIL/uL — AB (ref 3.87–5.11)
RDW: 14.7 % (ref 11.5–15.5)
WBC: 15.7 10*3/uL — ABNORMAL HIGH (ref 4.0–10.5)

## 2016-04-27 LAB — MAGNESIUM: Magnesium: 3.2 mg/dL — ABNORMAL HIGH (ref 1.7–2.4)

## 2016-04-27 MED ORDER — AMLODIPINE BESYLATE 10 MG PO TABS
10.0000 mg | ORAL_TABLET | Freq: Every day | ORAL | Status: DC
  Administered 2016-04-28 – 2016-05-07 (×10): 10 mg via ORAL
  Filled 2016-04-27 (×10): qty 1

## 2016-04-27 MED ORDER — METHYLPREDNISOLONE SODIUM SUCC 40 MG IJ SOLR
40.0000 mg | Freq: Every day | INTRAMUSCULAR | Status: DC
  Administered 2016-04-27 – 2016-04-29 (×3): 40 mg via INTRAVENOUS
  Filled 2016-04-27 (×3): qty 1

## 2016-04-27 MED ORDER — ESCITALOPRAM OXALATE 10 MG PO TABS
10.0000 mg | ORAL_TABLET | Freq: Every day | ORAL | Status: DC
  Administered 2016-04-27 – 2016-05-04 (×8): 10 mg via ORAL
  Filled 2016-04-27 (×8): qty 1

## 2016-04-27 MED ORDER — AMLODIPINE BESYLATE 5 MG PO TABS
5.0000 mg | ORAL_TABLET | Freq: Once | ORAL | Status: AC
  Administered 2016-04-27: 5 mg via ORAL
  Filled 2016-04-27: qty 1

## 2016-04-27 MED ORDER — PANTOPRAZOLE SODIUM 40 MG PO TBEC
40.0000 mg | DELAYED_RELEASE_TABLET | Freq: Every day | ORAL | Status: DC
  Administered 2016-04-28 – 2016-04-29 (×2): 40 mg via ORAL
  Filled 2016-04-27 (×2): qty 1

## 2016-04-27 NOTE — Progress Notes (Signed)
Patient placed on bedpan and became SOB with labored breathing.  RT called to bedside. Neb treatment given and then placed on BIPAP.   Patient is responding to BIPAP with decreased work of breathing.  Will continue to monitor patient.

## 2016-04-27 NOTE — Evaluation (Signed)
Clinical/Bedside Swallow Evaluation Patient Details  Name: Shamecka Hocutt MRN: [id number] Date of Birth: 27-Jun-1941  Today's Date: 04/27/2016 Time: SLP Start Time (ACUTE ONLY): 0946 SLP Stop Time (ACUTE ONLY): 1018 SLP Time Calculation (min) (ACUTE ONLY): 32 min  Past Medical History:  Past Medical History  Diagnosis Date  . Hypertension   . History of blood clots     Right leg & lung  . High cholesterol   . COPD (chronic obstructive pulmonary disease) (HCC)   . Asthma   . GERD (gastroesophageal reflux disease)   . PONV (postoperative nausea and vomiting)   . MI (myocardial infarction) (HCC)     X 2  . Heart murmur   . Shortness of breath   . Stroke (HCC)   . Anxiety   . Depression   . Difficulty swallowing solids   . Stomach ulcer   . Gout   . History of shingles   . Rash     MD aware and pt was given Monistat to use; on stomach, neck (hairline)  . Bruises easily   . Renal disorder    Past Surgical History:  Past Surgical History  Procedure Laterality Date  . Abdominal hysterectomy    . Breast surgery Left   . Appendectomy    . Colonoscopy w/ polypectomy    . Upper gi endoscopy    . Esophagus surgery      Esophagus stretched during endoscopy  . Ivc filter    . Cataract extraction, bilateral Bilateral   . Shoulder hemi-arthroplasty Right 04/16/2015    Procedure: RIGHT SHOULDER HEMI-ARTHROPLASTY WITH BIOPSY;  Surgeon: Beverely Low, MD;  Location: [facility] OR;  Service: Orthopedics;  Laterality: Right;   HPI:  75 yo female admitted with NSTEMI, HTN urgency, and AECOPD.She has hx of CKD and difficulty IV access. She had Rt IJ CVL placed 5/19 >> developed Rt neck hematoma with airway compromise. CT 5/24 shows intact airway without hematoma identified. Prevertebral soft tissue swelling due to patient size and medial deviation of the right carotid artery causing indentation of the posterior oropharynx.   Assessment / Plan / Recommendation Clinical Impression  Pt consumed  various PO consistencies without overt signs of aspiration or difficulty with swallowing. Pt reports improved comfort and denies the sensation of food/liquid getting "stuck". Per CT 5/24, no hematoma identified, although there is prevertebral swelling. Recommend starting with softer textures to allow for ease of pharyngeal clearance. Will continue to follow and advance as able.    Aspiration Risk  Mild aspiration risk    Diet Recommendation Dysphagia 2 (Fine chop);Thin liquid   Liquid Administration via: Cup;Straw Medication Administration: Whole meds with liquid Supervision: Patient able to self feed;Intermittent supervision to cue for compensatory strategies Compensations: Slow rate;Small sips/bites;Follow solids with liquid;Multiple dry swallows after each bite/sip Postural Changes: Seated upright at 90 degrees;Remain upright for at least 30 minutes after po intake    Other  Recommendations Oral Care Recommendations: Oral care BID   Follow up Recommendations   (tba)    Frequency and Duration min 2x/week  2 weeks       Prognosis Prognosis for Safe Diet Advancement: Good      Swallow Study   General HPI: 75 yo female admitted with NSTEMI, HTN urgency, and AECOPD.She has hx of CKD and difficulty IV access. She had Rt IJ CVL placed 5/19 >> developed Rt neck hematoma with airway compromise. CT 5/24 shows intact airway without hematoma identified. Prevertebral soft tissue swelling due to patient size and  medial deviation of the right carotid artery causing indentation of the posterior oropharynx. Type of Study: Bedside Swallow Evaluation Previous Swallow Assessment: none in chart Diet Prior to this Study: NPO Temperature Spikes Noted: No Respiratory Status: Nasal cannula History of Recent Intubation: No Behavior/Cognition: Alert;Cooperative;Pleasant mood Oral Cavity Assessment: Within Functional Limits Oral Care Completed by SLP: No Oral Cavity - Dentition: Adequate natural  dentition Vision: Functional for self-feeding Self-Feeding Abilities: Able to feed self Patient Positioning: Upright in bed Baseline Vocal Quality: Other (comment) (raspy)    Oral/Motor/Sensory Function Overall Oral Motor/Sensory Function: Within functional limits   Ice Chips Ice chips: Not tested   Thin Liquid Thin Liquid: Within functional limits Presentation: Self Fed;Straw    Nectar Thick Nectar Thick Liquid: Not tested   Honey Thick Honey Thick Liquid: Not tested   Puree Puree: Within functional limits Presentation: Self Fed;Spoon   Solid   GO   Solid: Within functional limits Presentation: Self Fed       Maxcine Ham, M.A. CCC-SLP [phone]  Maxcine Ham 04/27/2016,10:22 AM

## 2016-04-27 NOTE — Progress Notes (Signed)
HISTORY: [name] [name] is a 75 yo F with PMHx of COPD, CKD, diast CHF, angina/ MI x 2 (no hx cath) presented to [facility] on 5/16 with SOB and acute on chronic respiratory failure and found to be in hypertensive crisis. Cardiology was consulted on 5/19 for NSTEMI.   SUBJECTIVE:  Patient was seen and examined. She denies any chest pain or increased shortness of breath. Her main complaint is hunger and pain at her neck from the hematoma.  OBJECTIVE:   Vitals:   Filed Vitals:   04/27/16 0600 04/27/16 0700 04/27/16 0856 04/27/16 0902  BP: 169/76 175/70    Pulse: 89 87    Temp:   98.7 F (37.1 C)   TempSrc:   Oral   Resp: 20 9    Height:      Weight:      SpO2: 96% 96%  95%   I&O's:    Intake/Output Summary (Last 24 hours) at 04/27/16 1005 Last data filed at 04/27/16 0800  Gross per 24 hour  Intake      0 ml  Output    675 ml  Net   -675 ml   TELEMETRY: Reviewed telemetry pt in sinus rhythm.  PHYSICAL EXAM General: Vital signs reviewed.  Patient is obese, in no acute distress and cooperative with exam.  Head: Normocephalic and atraumatic. Neck: Supple, obese, RIJ in place, large circumferential hematoma/ecchymosis extending from neck to proximal chest.  Cardiovascular: RRR, S1 normal, S2 normal. Pulmonary/Chest: Mild expiratory wheezes, no rhonchi or rales. Abdominal: Soft, non-tender, non-distended, BS +.  Extremities: No lower extremity edema bilaterally, pulses symmetric and intact bilaterally.  Skin: Large ecchymoses as mentioned above on neck, chest, and arms.  LABS: Basic Metabolic Panel:  Recent Labs  40/98/11 0440 04/27/16 0526  NA 138 141  K 4.6 5.0  CL 101 104  CO2 28 29  GLUCOSE 136* 147*  BUN 53* 53*  CREATININE 1.56* 1.65*  CALCIUM 8.7* 8.9  MG  --  3.2*  PHOS 4.1 5.2*   Liver Function Tests:  Recent Labs  04/26/16 0440 04/27/16 0526  ALBUMIN 3.4* 3.5   CBC:  Recent Labs  04/26/16 0440 04/27/16 0526  WBC 14.0* 15.7*  HGB 10.8*  10.9*  HCT 33.3* 34.0*  MCV 100.3* 103.3*  PLT 133* 128*   Coag Panel:   Lab Results  Component Value Date   INR 1.13 04/23/2016   INR 1.17 2016/04/26   INR 1.02 04/15/2015    RADIOLOGY: Dg Neck Soft Tissue  04/26/2016  CLINICAL DATA:  75 year old female with stridor, wheezing. Initial encounter. EXAM: NECK SOFT TISSUES - 1+ VIEW COMPARISON:  04/23/2016 neck x-rays and earlier. FINDINGS: Right IJ approach venous catheter remains in place, tip not included. On the lateral view of the neck there is abnormal submental/ anterior neck subcutaneous stranding (arrow). The leftward tracheal deviation seen previously has regressed. The abnormal prevertebral soft tissue contour demonstrated on 04/23/2016 has regressed but not completely normalized (13 mm at the C2 level versus normal of 5mm or less). Mass effect on the pharynx has regressed but not resolved. No subcutaneous gas identified. No pneumothorax in the visible lung apices. IMPRESSION: Regressed but not resolved abnormal density and stranding in the neck which probably reflects resolving trans-spatial hematoma in this setting. Neck CT (IV contrast preferred) may confirm as needed. Electronically Signed   By: Odessa Fleming M.D.   On: 04/26/2016 13:18   Dg Neck Soft Tissue  04/23/2016  CLINICAL DATA:  Right  neck pain/ swelling EXAM: NECK SOFT TISSUES - 1+ VIEW COMPARISON:  None. FINDINGS: Prevertebral soft tissues anterior to the upper cervical spine are markedly enlarged on the lateral view, indicating prevertebral edema. On the frontal radiograph, the trachea is deviated to the left. IMPRESSION: Suspected prevertebral edema with leftward tracheal deviation. CT neck with contrast is suggested for further characterization. Electronically Signed   By: Charline Bills M.D.   On: 04/23/2016 09:25   Dg Chest 1 View  04/03/2016  CLINICAL DATA:  Central line placement EXAM: CHEST 1 VIEW COMPARISON:  04/18/2016 chest radiograph. FINDINGS: Right internal  jugular central venous catheter terminates in the middle third of the superior vena cava. Stable cardiomediastinal silhouette with top-normal heart size. No pneumothorax. No right pleural effusion. No overt pulmonary edema. Small left pleural effusion, probably stable. Mild left basilar atelectasis. IMPRESSION: 1. Right internal jugular central venous catheter terminates in the middle third of the superior vena cava. No pneumothorax. 2. Stable small left pleural effusion with mild left basilar atelectasis. Electronically Signed   By: Delbert Phenix M.D.   On: 04/24/2016 15:48   Ct Soft Tissue Neck Wo Contrast  04/26/2016  CLINICAL DATA:  Hematoma right neck. 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 COMPARISON:  Neck radiographs 04/26/2016 FINDINGS: Pharynx and larynx: Tongue is normal. Tonsils are normal. Nasopharynx normal. There is tortuosity of the right internal carotid indenting the posterior wall of the oropharynx. Some of this may account for prevertebral soft tissue thickening noted on x-ray. Prevertebral soft tissues measure up 12 mm in thickness on the CT but no mass or hematoma identified. Marked improvement in prevertebral soft tissue swelling since the x-ray of 04/23/2016. This may be due to resolving hematoma. Airway intact. Larynx normal. Salivary glands: Prominent parotid glands bilaterally which are fatty replaced. Submandibular glands normal bilaterally. Thyroid: Negative Lymph nodes: Negative for adenopathy in the neck. Vascular: Atherosclerotic calcification in the carotid bifurcation bilaterally. Right internal carotid artery is tortuous with retropharyngeal course indenting the posterior oropharynx. Right jugular central venous catheter in good position. No hematoma at the catheter site. Limited intracranial: Negative Visualized orbits: Not  imaged Mastoids and visualized paranasal sinuses: Small air-fluid level in the sphenoid sinus. Remaining paranasal sinuses clear. Mastoid sinus clear bilaterally. Skeleton: Mild disc degeneration and spurring in the thoracic spine. No fracture or bone lesion. Advanced degenerative change in the left TMJ and moderate degenerative change in the right TMJ. Upper chest: Mild stranding in the right upper chest subcutaneous fat. There is also similar stranding in the subcutaneous fat of the chin bilaterally left greater than right. These areas may be due to bruising or cellulitis. No frank hematoma identified. Lung apices clear. Prominent extrapleural fat in the lung apices posteriorly bilaterally. IMPRESSION: The airway is  intact.  No hematoma is identified. Prevertebral soft tissue swelling due to large patient size and medial deviation of the right internal carotid artery causing indentation of the posterior oropharynx. Currently no hematoma. Marked prevertebral soft tissue swelling on 04/23/2016 May have been edema or hematoma which has resolved. Mild stranding in the right anterior chest and bilateral chin may be due to cellulitis or bruising. Electronically Signed   By: Marlan Palau M.D.   On: 04/26/2016 16:08   Dg Chest Port 1 View  04/26/2016  CLINICAL DATA:  75 year old female with stridor and wheezing. Initial encounter. EXAM: PORTABLE CHEST 1 VIEW COMPARISON:  0828 hours today and earlier. FINDINGS: Portable AP semi upright view at 1243 hours. Stable right IJ central line, tip at the level of the aortic arch, cephalad SVC. Stable cardiac size and mediastinal contours. No pneumothorax or pulmonary edema. No consolidation. Left lung base ventilation is stable. No new pulmonary opacity. IMPRESSION: Stable ventilation since 0828 hours today. No new cardiopulmonary abnormality. Electronically Signed   By: Odessa Fleming M.D.   On: 04/26/2016 13:20   Dg Chest Port 1 View  04/26/2016  CLINICAL DATA:  Acute  respiratory failure with hypoxia EXAM: PORTABLE CHEST - 1 VIEW COMPARISON:  05/01/2016 FINDINGS: Stable right IJ central venous catheter. Lungs are clear. Improved aeration of the left lung base. Heart size and mediastinal contours are within normal limits. No effusion evident. Right shoulder arthroplasty hardware partially visualized. IMPRESSION: Improved aeration at the left lung base. Electronically Signed   By: Corlis Leak M.D.   On: 04/26/2016 08:45   Dg Chest Port 1 View  05/02/2016  CLINICAL DATA:  Respiratory failure. EXAM: PORTABLE CHEST 1 VIEW COMPARISON:  05/06/2016. FINDINGS: Right IJ line in stable position. Mild left base atelectasis and/or infiltrate. Small left pleural effusion. No pneumothorax. Heart size normal. Right shoulder replacement. IMPRESSION: 1.  Right IJ line in stable position. 2. Mild left base atelectasis and/or infiltrate. Small left pleural effusion. No interim change. Electronically Signed   By: Maisie Fus  Register   On: 04/12/2016 07:13   TTE 04/22/2015: Study Conclusions  - Left ventricle: The cavity size was normal. Wall thickness was  increased in a pattern of moderate LVH. Systolic function was  vigorous. The estimated ejection fraction was in the range of 65%  to 70%. Wall motion was normal; there were no regional wall  motion abnormalities. Doppler parameters are consistent with  abnormal left ventricular relaxation (grade 1 diastolic  dysfunction). - Mitral valve: Calcified annulus. There was mild regurgitation. - Left atrium: The atrium was mildly dilated.  Impressions:  - Normal LV systolic function; moderate LVH; grade 1 diastolic  dysfunction; mild LAE.; mild MR.  ASSESSMENT/PLAN:   [name] [name] is a 74 yo F with PMHx of COPD, CKD, diast CHF, angina/ MI x 2 (no hx cath) presented to [facility] on 5/16 with SOB and acute on chronic respiratory failure and found to be in hypertensive crisis. Cardiology was consulted on 5/19 for NSTEMI.    NSTEMI in setting of CAD: On admission, patient had elevated troponin and EKG with lateral ST depressions. Patient was started on IV nitroglycerin and IV heparin. On 5/21 patient developed a large hematoma in her neck and IV heparin and ASA was discontinued. Plans for cath were delayed given worsening CKD, multiple comorbities and clinical status. On 5/23 BB was discontinued given worsened wheezing.  -Atorvastatin 40 mg daily -Imdur 120 mg daily -Off IV heparin and ASA due to recent IJ hematoma- consider restarting ASA when able -Off BB due to worsened respiratory status- consider trial of bisoprolol -Cardiac cath has been delayed due to acute medical problems  Acute on Chronic HFpEF: -525 UOP yesterday. Weight stable at 230.  -Continue spironolactone 25 mg daily -Continue Imdur 120 mg dialy -Continue hydralazine 50 mg Q8H -Increase amlodipine to 10 mg daily -Consider trial of bisoprolol once respiratory status improves and is stable -Holding lasix given worsening CKD -Daily weight, strict I/Os  HTN: Patient originally in hypertensive urgency on admission which resolved. BPs are trending upwards- 180s overnight to 150-170s this morning.  -Continue spironolactone 25 mg daily -Continue  Imdur 120 mg dialy -Continue hydralazine 50 mg Q8H -On hydralazine 10 mg IV Q4H prn  -Increase amlodipine to 10 mg daily -No Lasix or ACEI d/t renal function -Holding BB given worsened respiratory status  Karlene Lineman, DO PGY-2 Internal Medicine Resident Pager # [phone] 04/27/2016 10:05 AM   The patient has been seen in conjunction with Alexa Burns, D.O. All aspects of care have been considered and discussed. The patient has been personally interviewed, examined, and all clinical data has been reviewed.   Still having respiratory issues.  We have increased amlodipine for better blood pressure control.  Stable from cardiac standpoint.  No new recommendations.  Will need cardiac  catheterization. Let us know prior to discharge and we will determine if this should be done before going home or as an outpatient. Please call if further questions.

## 2016-04-27 NOTE — Progress Notes (Signed)
RN called that patient was SOB and needed a breathing treatment. Upon arrival patient was severally SOB. Breathing treatment given. Decreased air movement with expiratory wheezing. Exp. Wheezing in upper airways. Pt continues to be SOB, RR 27 SPO2 95%. Accessory muscles use. Patient placed on BIPAP. Patient is now feeling better, some increased WOB still and exp wheezes. RT will continue to monitor patient.

## 2016-04-27 NOTE — Progress Notes (Signed)
PULMONARY / CRITICAL CARE MEDICINE   Name: [name] [name] MRN: [id number] DOB: 1940/12/14    ADMISSION DATE:  04/30/2016 CONSULTATION DATE:  04/23/2016  REFERRING MD:  Viann Fish, Teodora Medici  CHIEF COMPLAINT:  Can't breath  SUBJECTIVE:  Patient had increased WOB overnight with turning & was briefly on BiPAP for an hour or so. Patient reports increased anxiety. Denies any chest pressure or pain. Minimal cough. Minimal wheezing.  REVIEW OF SYSTEMS:  No fever, chills, or sweats. No nausea or abdominal pain.  VITAL SIGNS: BP 169/76 mmHg  Pulse 89  Temp(Src) 97.3 F (36.3 C) (Axillary)  Resp 20  Ht 5\' 3"  (1.6 m)  Wt 230 lb 2.6 oz (104.4 kg)  BMI 40.78 kg/m2  SpO2 96%  HEMODYNAMICS:    VENTILATOR SETTINGS: Vent Mode:  [-] BIPAP;PCV FiO2 (%):  [40 %] 40 % Set Rate:  [16 bmp] 16 bmp PEEP:  [6 cmH20] 6 cmH20  INTAKE / OUTPUT: I/O last 3 completed shifts: In: -  Out: 725 [Urine:725]  PHYSICAL EXAMINATION: General:  Elderly female. No distress. Awake. Neuro:  CN in tact grossly. A&O x4. Following commands. HEENT:  No stridor. Bruising over neck. MMM. Cardiovascular: Regular rate. Unable to appreciate JVD. Lungs: Mild apical wheezing. Normal work of breathing on [employer] oxygen. Otherwise CTAB.  Abdomen:  Soft. Protuberant. Nontender.  Integument:  Warm & dry. No rash on exposed skin. Multiple bruises on arms  LABS:  BMET  Recent Labs Lab 04/25/16 0359 04/26/16 0440 04/27/16 0526  NA 140 138 141  K 3.9 4.6 5.0  CL 101 101 104  CO2 27 28 29   BUN 58* 53* 53*  CREATININE 1.65* 1.56* 1.65*  GLUCOSE 118* 136* 147*    Electrolytes  Recent Labs Lab 04/25/16 0359 04/26/16 0440 04/27/16 0526  CALCIUM 8.5* 8.7* 8.9  MG  --   --  3.2*  PHOS 4.8* 4.1 5.2*    CBC  Recent Labs Lab 04/25/16 0356 04/26/16 0440 04/27/16 0526  WBC 18.7* 14.0* 15.7*  HGB 11.1* 10.8* 10.9*  HCT 35.0* 33.3* 34.0*  PLT 130* 133* 128*    Coag's  Recent Labs Lab 04/14/2016 1240  04/23/16 1115  INR 1.17 1.13    Sepsis Markers No results for input(s): LATICACIDVEN, PROCALCITON, O2SATVEN in the last 168 hours.  ABG  Recent Labs Lab 04/23/16 1028 04/26/16 1253  PHART 7.424 7.506*  PCO2ART 43.0 36.5  PO2ART 87.6 65.0*    Liver Enzymes  Recent Labs Lab 04/25/16 0359 04/26/16 0440 04/27/16 0526  ALBUMIN 3.5 3.4* 3.5    Cardiac Enzymes  Recent Labs Lab 04/16/2016 0813 04/28/2016 1236 04/03/2016 2046  TROPONINI 7.66* 5.91* 5.39*    Glucose  Recent Labs Lab 04/25/16 2111 04/26/16 0807 04/26/16 1558 04/26/16 2030 04/26/16 2353 04/27/16 0401  GLUCAP 105* 163* 149* 126* 113* 138*    Imaging Dg Neck Soft Tissue  04/26/2016  CLINICAL DATA:  75 year old female with stridor, wheezing. Initial encounter. EXAM: NECK SOFT TISSUES - 1+ VIEW COMPARISON:  04/23/2016 neck x-rays and earlier. FINDINGS: Right IJ approach venous catheter remains in place, tip not included. On the lateral view of the neck there is abnormal submental/ anterior neck subcutaneous stranding (arrow). The leftward tracheal deviation seen previously has regressed. The abnormal prevertebral soft tissue contour demonstrated on 04/23/2016 has regressed but not completely normalized (13 mm at the C2 level versus normal of 5mm or less). Mass effect on the pharynx has regressed but not resolved. No subcutaneous gas identified. No pneumothorax in the  visible lung apices. IMPRESSION: Regressed but not resolved abnormal density and stranding in the neck which probably reflects resolving trans-spatial hematoma in this setting. Neck CT (IV contrast preferred) may confirm as needed. Electronically Signed   By: Odessa Fleming M.D.   On: 04/26/2016 13:18   Ct Soft Tissue Neck Wo Contrast  04/26/2016  CLINICAL DATA:  Hematoma right neck. Right shoulder arthroplasty 04/16/2015 EXAM: CT NECK WITHOUT CONTRAST TECHNIQUE: Multidetector CT imaging of the neck was performed following the standard protocol without  intravenous contrast. COMPARISON:  Neck radiographs 04/26/2016 FINDINGS: Pharynx and larynx: Tongue is normal. Tonsils are normal. Nasopharynx normal. There is tortuosity of the right internal carotid indenting the posterior wall of the oropharynx. Some of this may account for prevertebral soft tissue thickening noted on x-ray. Prevertebral soft tissues measure up 12 mm in thickness on the CT but no mass or hematoma identified. Marked improvement in prevertebral soft tissue swelling since the x-ray of 04/23/2016. This may be due to resolving hematoma. Airway intact. Larynx normal. Salivary glands: Prominent parotid glands bilaterally which are fatty replaced. Submandibular glands normal bilaterally. Thyroid: Negative Lymph nodes: Negative for adenopathy in the neck. Vascular: Atherosclerotic calcification in the carotid bifurcation bilaterally. Right internal carotid artery is tortuous with retropharyngeal course indenting the posterior oropharynx. Right jugular central venous catheter in good position. No hematoma at the catheter site. Limited intracranial: Negative Visualized orbits: Not imaged Mastoids and visualized paranasal sinuses: Small air-fluid level in the sphenoid sinus. Remaining paranasal sinuses clear. Mastoid sinus clear bilaterally. Skeleton: Mild disc degeneration and spurring in the thoracic spine. No fracture or bone lesion. Advanced degenerative change in the left TMJ and moderate degenerative change in the right TMJ. Upper chest: Mild stranding in the right upper chest subcutaneous fat. There is also similar stranding in the subcutaneous fat of the chin bilaterally left greater than right. These areas may be due to bruising or cellulitis. No frank hematoma identified. Lung apices clear. Prominent extrapleural fat in the lung apices posteriorly bilaterally. IMPRESSION: The airway is  intact.  No hematoma is identified. Prevertebral soft tissue swelling due to large patient size and medial  deviation of the right internal carotid artery causing indentation of the posterior oropharynx. Currently no hematoma. Marked prevertebral soft tissue swelling on 04/23/2016 May have been edema or hematoma which has resolved. Mild stranding in the right anterior chest and bilateral chin may be due to cellulitis or bruising. Electronically Signed   By: Marlan Palau M.D.   On: 04/26/2016 16:08   Dg Chest Port 1 View  04/26/2016  CLINICAL DATA:  75 year old female with stridor and wheezing. Initial encounter. EXAM: PORTABLE CHEST 1 VIEW COMPARISON:  0828 hours today and earlier. FINDINGS: Portable AP semi upright view at 1243 hours. Stable right IJ central line, tip at the level of the aortic arch, cephalad SVC. Stable cardiac size and mediastinal contours. No pneumothorax or pulmonary edema. No consolidation. Left lung base ventilation is stable. No new pulmonary opacity. IMPRESSION: Stable ventilation since 0828 hours today. No new cardiopulmonary abnormality. Electronically Signed   By: Odessa Fleming M.D.   On: 04/26/2016 13:20   Dg Chest Port 1 View  04/26/2016  CLINICAL DATA:  Acute respiratory failure with hypoxia EXAM: PORTABLE CHEST - 1 VIEW COMPARISON:  04/17/2016 FINDINGS: Stable right IJ central venous catheter. Lungs are clear. Improved aeration of the left lung base. Heart size and mediastinal contours are within normal limits. No effusion evident. Right shoulder arthroplasty hardware partially visualized. IMPRESSION: Improved  aeration at the left lung base. Electronically Signed   By: Corlis Leak M.D.   On: 04/26/2016 08:45     STUDIES:  5/19 Echo >> mod LVH, EF 65 to 70%, grade 1 diastolic CHF 5/22 Port CXR:  R IJ in good position. No new opacity. 5/24 Port CXR:  No new focal opacity. IJ in good position. 5/24 X-ray Neck:  Regressed density & stranding in neck. 5/24 CT neck w/o: Airway in tact. No hematoma. Indentation of posterior pharynx by anatomy. Mild stranding right anterior chest &  bilateral chin could be due to cellulitis or bruising.  MICROBIOLOGY: MRSA PCR 5/19:  Negative   ANTIBIOTICS: None  SIGNIFICANT EVENTS: 5/19 Admit, cardiology consulted 5/21 Transfer to ICU with respiratory distress from Rt neck hematoma 5/22 Transferred back to SDU, tolerating  5/24- worseing status resp failure, back to ICU  LINES/TUBES: 5/19 Rt IJ CVL >> PIV x2  ASSESSMENT / PLAN:  PULMONARY A: Acute Exacerbation of COPD R/o NEW ASPIRATION EVENT 5/24 Presumed sleep disordered breathing  P:   Titrate FiO2 to keep SpO2 > 92% Breo inh daily Duoneb q6hr D/C Decadron Solu-Medrol  IV q24hr  CARDIOVASCULAR A:  NSTEMI - Cath currently on hold. Hypertensive Urgency - Resolved. Acute on Chronic Diastolic CHF H/O HTN  P:  Cardiology Following Continuing to hold ASA & Heparin gtt w/ hematoma norvasc  QD, aldactone 25 mg QD, hydralazine  q8hr, imdur  QD, lipitor  QHS  RENAL A:   Acute on Chronic Renal Failure Stage IV - Creatinine stable.  P:   Trending UOP Monitoring electrolytes & renal function daily  GASTROINTESTINAL A:   H/O GERD Questionable Aspiration  P:   Ice Chips & Sips Speech Eval Pending Protonix IV q24hr  HEMATOLOGIC A:   Neck Hematoma - Resolving. Anemia - Mild. No obvious ongoing bleeding. Thrombocytopenia -  Improved overall  P:  Trending cell counts daily w/ CBC SCDs  INFECTIOUS A:   No evidence for infection.  P:   Monitor for fever  ENDOCRINE A:   Hyperglycemia - BG controlled.   P:   Accu-Checks q4hr SSI per resistant algorithm  NEUROLOGIC A:   H/O CVA H/O Anxiety H/O Depression  P:   Monitor clinically Ativan IV prn Dilaudid IV prn Tylenol prn Restart Lexapro at  daily (home dose )  TODAY'S SUMMARY: 75 yo female admitted with NSTEMI, HTN urgency, and AECOPD. She has hx of CKD and difficulty IV access. She had Rt IJ CVL placed 5/19 >> developed Rt neck hematoma with airway  compromise and transferred to ICU. Speech evaluation pending. De-escalating steroid medications. Adding back on Lexapro for treatment of anxiety. Suspect most of the patient's symptomatology is secondary to her underlying anxiety. Continue close monitoring in the intensive care unit and BiPAP as needed.  Donna Christen Jamison Neighbor, M.D. [facility] Pulmonary [facility] Critical [facility] Pager:  [phone] After 3pm or if no response, call [phone] 2:07 PM 04/27/2016

## 2016-04-28 LAB — CBC
HCT: 32.2 % — ABNORMAL LOW (ref 36.0–46.0)
HEMOGLOBIN: 10.2 g/dL — AB (ref 12.0–15.0)
MCH: 32.3 pg (ref 26.0–34.0)
MCHC: 31.7 g/dL (ref 30.0–36.0)
MCV: 101.9 fL — ABNORMAL HIGH (ref 78.0–100.0)
PLATELETS: 125 10*3/uL — AB (ref 150–400)
RBC: 3.16 MIL/uL — AB (ref 3.87–5.11)
RDW: 14.4 % (ref 11.5–15.5)
WBC: 15.4 10*3/uL — ABNORMAL HIGH (ref 4.0–10.5)

## 2016-04-28 LAB — RENAL FUNCTION PANEL
ALBUMIN: 3.5 g/dL (ref 3.5–5.0)
ANION GAP: 5 (ref 5–15)
BUN: 50 mg/dL — ABNORMAL HIGH (ref 6–20)
CALCIUM: 8.7 mg/dL — AB (ref 8.9–10.3)
CO2: 32 mmol/L (ref 22–32)
CREATININE: 1.7 mg/dL — AB (ref 0.44–1.00)
Chloride: 103 mmol/L (ref 101–111)
GFR calc non Af Amer: 29 mL/min — ABNORMAL LOW (ref >60)
GFR, EST AFRICAN AMERICAN: 33 mL/min — AB (ref >60)
GLUCOSE: 120 mg/dL — AB (ref 65–99)
PHOSPHORUS: 5 mg/dL — AB (ref 2.5–4.6)
Potassium: 4.9 mmol/L (ref 3.5–5.1)
SODIUM: 140 mmol/L (ref 135–145)

## 2016-04-28 LAB — GLUCOSE, CAPILLARY
GLUCOSE-CAPILLARY: 211 mg/dL — AB (ref 65–99)
Glucose-Capillary: 126 mg/dL — ABNORMAL HIGH (ref 65–99)
Glucose-Capillary: 116 mg/dL — ABNORMAL HIGH (ref 65–99)

## 2016-04-28 MED ORDER — INSULIN ASPART 100 UNIT/ML ~~LOC~~ SOLN
0.0000 [IU] | Freq: Every day | SUBCUTANEOUS | Status: DC
  Administered 2016-04-28: 2 [IU] via SUBCUTANEOUS
  Administered 2016-05-01: 3 [IU] via SUBCUTANEOUS

## 2016-04-28 MED ORDER — INSULIN ASPART 100 UNIT/ML ~~LOC~~ SOLN
0.0000 [IU] | Freq: Three times a day (TID) | SUBCUTANEOUS | Status: DC
  Administered 2016-04-28: 4 [IU] via SUBCUTANEOUS
  Administered 2016-04-28 – 2016-05-01 (×10): 3 [IU] via SUBCUTANEOUS
  Administered 2016-05-02 – 2016-05-04 (×4): 4 [IU] via SUBCUTANEOUS
  Administered 2016-05-04: 3 [IU] via SUBCUTANEOUS
  Administered 2016-05-04: 7 [IU] via SUBCUTANEOUS
  Administered 2016-05-05: 4 [IU] via SUBCUTANEOUS
  Administered 2016-05-05: 3 [IU] via SUBCUTANEOUS
  Administered 2016-05-05: 11 [IU] via SUBCUTANEOUS
  Administered 2016-05-06: 4 [IU] via SUBCUTANEOUS
  Administered 2016-05-06 (×2): 3 [IU] via SUBCUTANEOUS

## 2016-04-28 NOTE — Progress Notes (Signed)
PULMONARY / CRITICAL CARE MEDICINE   Name: [name] [name] MRN: [id number] DOB: Aug 26, 1941    ADMISSION DATE:  Apr 22, 2016 CONSULTATION DATE:  04/23/2016  REFERRING MD:  Viann Fish, Teodora Medici  CHIEF COMPLAINT:  Can't breath  SUBJECTIVE: Patient was on BiPAP from midnight to 7am this morning. She reports she did sleep well with BiPAP last night. Wheezing and dyspnea seem to be improving. Denies any fever or chills.  REVIEW OF SYSTEMS: No nausea or abdominal pain. No chest pain or pressure.  VITAL SIGNS: BP 156/131 mmHg  Pulse 108  Temp(Src) 98.9 F (37.2 C) (Oral)  Resp 17  Ht  (1.6 m)  Wt 231 lb 7.7 oz (105 kg)  BMI 41.02 kg/m2  SpO2 98%  HEMODYNAMICS:    VENTILATOR SETTINGS: Vent Mode:  [-] BIPAP;PCV FiO2 (%):  [40 %] 40 % Set Rate:  [15 bmp] 15 bmp PEEP:  [6 cmH20] 6 cmH20  INTAKE / OUTPUT: I/O last 3 completed shifts: In: 225 [P.O.:225] Out: 1765 [Urine:1765]  PHYSICAL EXAMINATION: General:  Elderly female. Obese. Sleeping until awoken.  Neuro:  CN in tact. A&O x4. Following commands. HEENT:  No stridor. Bruising over neck unchanged. Moist mucus membranes. Cardiovascular: Regular rate. Unable to appreciate JVD. No edema. Lungs: Mild apical wheezing referred upper airway sounds. Normal work of breathing on [place] oxygen. Otherwise CTAB.  Abdomen:  Soft. Protuberant. Nontender.  Integument:  Warm & dry. No rash on exposed skin. Multiple bruises on arms  LABS:  BMET  Recent Labs Lab 04/26/16 0440 04/27/16 0526 04/28/16 0430  NA 138 141 140  K 4.6 5.0 4.9  CL 101 104 103  CO2 28 29 32  BUN 53* 53* 50*  CREATININE 1.56* 1.65* 1.70*  GLUCOSE 136* 147* 120*    Electrolytes  Recent Labs Lab 04/26/16 0440 04/27/16 0526 04/28/16 0430  CALCIUM 8.7* 8.9 8.7*  MG  --  3.2*  --   PHOS 4.1 5.2* 5.0*    CBC  Recent Labs Lab 04/26/16 0440 04/27/16 0526 04/28/16 0430  WBC 14.0* 15.7* 15.4*  HGB 10.8* 10.9* 10.2*  HCT 33.3* 34.0* 32.2*  PLT  133* 128* 125*    Coag's  Recent Labs Lab 04-22-16 1240 04/23/16 1115  INR 1.17 1.13    Sepsis Markers No results for input(s): LATICACIDVEN, PROCALCITON, O2SATVEN in the last 168 hours.  ABG  Recent Labs Lab 04/23/16 1028 04/26/16 1253  PHART 7.424 7.506*  PCO2ART 43.0 36.5  PO2ART 87.6 65.0*    Liver Enzymes  Recent Labs Lab 04/26/16 0440 04/27/16 0526 04/28/16 0430  ALBUMIN 3.4* 3.5 3.5    Cardiac Enzymes  Recent Labs Lab Apr 22, 2016 1236 04-22-16 2046  TROPONINI 5.91* 5.39*    Glucose  Recent Labs Lab 04/27/16 0854 04/27/16 1153 04/27/16 1600 04/27/16 2011 04/27/16 2326 04/28/16 0349  GLUCAP 135* 147* 140* 160* 126* 116*    Imaging No results found.   STUDIES:  5/19 Echo >> mod LVH, EF 65 to 70%, grade 1 diastolic CHF 5/22 Port CXR:  R IJ in good position. No new opacity. 5/24 Port CXR:  No new focal opacity. IJ in good position. 5/24 X-ray Neck:  Regressed density & stranding in neck. 5/24 CT neck w/o: Airway in tact. No hematoma. Indentation of posterior pharynx by anatomy. Mild stranding right anterior chest & bilateral chin could be due to cellulitis or bruising.  MICROBIOLOGY: MRSA PCR 5/19:  Negative   ANTIBIOTICS: None  SIGNIFICANT EVENTS: 5/19 Admit, cardiology consulted 5/21 Transfer to ICU  with respiratory distress from Rt neck hematoma 5/22 Transferred back to SDU, tolerating  5/24- worseing status resp failure, back to ICU  LINES/TUBES: 5/19 Rt IJ CVL >> PIV x2  ASSESSMENT / PLAN:  PULMONARY A: Acute Exacerbation of COPD R/o NEW ASPIRATION EVENT 5/24 Presumed sleep disordered breathing  P:   Titrate FiO2 to keep SpO2 > 92% Breo inh daily Duoneb q6hr Continue Solu-Medrol 40mg  IV q24hr  CARDIOVASCULAR A:  NSTEMI - Cath currently on hold. Hypertensive Urgency - Resolved. Acute on Chronic Diastolic CHF H/O HTN  P:  Cardiology Following Continuing to hold ASA & Heparin gtt w/ hematoma Norvasc 10mg  QD,  aldactone 25 mg QD, hydralazine 50mg  q8hr, imdur 120mg  QD, lipitor 40mg  QHS  RENAL A:   Acute on Chronic Renal Failure Stage IV - Creatinine stable.  P:   Trending UOP Monitoring electrolytes & renal function daily  GASTROINTESTINAL A:   H/O GERD Questionable Aspiration  P:   Ice Chips & Sips Speech Eval Pending Protonix PO q24hr  HEMATOLOGIC A:   Neck Hematoma - Resolving. Anemia - Mild. No obvious ongoing bleeding. Thrombocytopenia -  Improved overall  P:  Trending cell counts daily w/ CBC SCDs  INFECTIOUS A:   No evidence for infection.  P:   Monitor for fever  ENDOCRINE A:   Hyperglycemia - BG controlled.   P:   Accu-Checks q4hr SSI per resistant algorithm  NEUROLOGIC A:   H/O CVA H/O Anxiety H/O Depression  P:   Monitor clinically Ativan IV prn Dilaudid IV prn Tylenol prn Continue Lexapro at 10mg  daily (home dose 20mg ) PT consult  TODAY'S SUMMARY: 75 yo female admitted with NSTEMI, HTN urgency, and AECOPD. She has hx of CKD and difficulty IV access. She had Rt IJ CVL placed 5/19 >> developed Rt neck hematoma with airway compromise and transferred to ICU. Patient continuing to tolerate diet. Anxiety seems to be significant contributor factor to her dyspnea. Continuing noninvasive positive pressure ventilation for increased work of breathing intermittently and also sleeping. Continuing current dose of Lexapro and when necessary IV Ativan for treatment of anxiety. Hypertension with slightly better control on current regimen. Given patient's clinical improvement I am transferring the patient to stepdown unit. We will remain the primary service.  Donna Christen Jamison Neighbor, M.D. [facility] Pulmonary & Critical Care Pager:  [phone] After 3pm or if no response, call [phone] 11:08 AM 04/28/2016

## 2016-04-28 NOTE — Progress Notes (Signed)
Transferred pt to 2c11 at this time.  Pt has no s/s of any acute distress.  No c/o pain.

## 2016-04-28 NOTE — Care Management Important Message (Signed)
Important Message  Patient Details  Name: [name] [name] MRN: [id number] Date of Birth: [birth year]   Medicare Important Message Given:  Yes    [name] [name] [name] 04/28/2016, 1:21 PM

## 2016-04-28 NOTE — Progress Notes (Signed)
Speech Language Pathology Treatment: Dysphagia  Patient Details Name: [name] [name] MRN: [id number] DOB: [birth year] Today's Date: 04/28/2016 Time: 9528-4132 SLP Time Calculation (min) (ACUTE ONLY): 9 min  Assessment / Plan / Recommendation Clinical Impression  Pt observed during breakfast meal with Dys 2 tray and thin liquids via straw. No overt s/s of aspiration observed and pt denies the sensation of POs sticking in her throat. She did need Min cues to take smaller bites. Recommend to continue with current diet for now.   HPI HPI: 75 yo female admitted with NSTEMI, HTN urgency, and AECOPD.She has hx of CKD and difficulty IV access. She had Rt IJ CVL placed 5/19 >> developed Rt neck hematoma with airway compromise. CT 5/24 shows intact airway without hematoma identified. Prevertebral soft tissue swelling due to patient size and medial deviation of the right carotid artery causing indentation of the posterior oropharynx.      SLP Plan  Continue with current plan of care     Recommendations  Diet recommendations: Dysphagia 2 (fine chop);Thin liquid Liquids provided via: Cup;Straw Medication Administration: Whole meds with liquid Supervision: Patient able to self feed;Intermittent supervision to cue for compensatory strategies Compensations: Slow rate;Small sips/bites;Follow solids with liquid;Multiple dry swallows after each bite/sip Postural Changes and/or Swallow Maneuvers: Seated upright 90 degrees;Upright 30-60 min after meal             Oral Care Recommendations: Oral care BID Follow up Recommendations:  (tba) Plan: Continue with current plan of care     GO               Maxcine Ham, M.A. CCC-SLP [phone]  Maxcine Ham 04/28/2016, 9:48 AM

## 2016-04-29 LAB — RENAL FUNCTION PANEL
ANION GAP: 11 (ref 5–15)
Albumin: 3.5 g/dL (ref 3.5–5.0)
BUN: 48 mg/dL — ABNORMAL HIGH (ref 6–20)
CO2: 28 mmol/L (ref 22–32)
Calcium: 9 mg/dL (ref 8.9–10.3)
Chloride: 100 mmol/L — ABNORMAL LOW (ref 101–111)
Creatinine, Ser: 1.69 mg/dL — ABNORMAL HIGH (ref 0.44–1.00)
GFR calc Af Amer: 34 mL/min — ABNORMAL LOW (ref >60)
GFR calc non Af Amer: 29 mL/min — ABNORMAL LOW (ref >60)
GLUCOSE: 116 mg/dL — AB (ref 65–99)
POTASSIUM: 4.6 mmol/L (ref 3.5–5.1)
Phosphorus: 3.9 mg/dL (ref 2.5–4.6)
Sodium: 139 mmol/L (ref 135–145)

## 2016-04-29 LAB — GLUCOSE, CAPILLARY
GLUCOSE-CAPILLARY: 130 mg/dL — AB (ref 65–99)
GLUCOSE-CAPILLARY: 153 mg/dL — AB (ref 65–99)
GLUCOSE-CAPILLARY: 184 mg/dL — AB (ref 65–99)
Glucose-Capillary: 126 mg/dL — ABNORMAL HIGH (ref 65–99)

## 2016-04-29 LAB — CBC
HEMATOCRIT: 30.5 % — AB (ref 36.0–46.0)
HEMOGLOBIN: 10 g/dL — AB (ref 12.0–15.0)
MCH: 33.4 pg (ref 26.0–34.0)
MCHC: 32.8 g/dL (ref 30.0–36.0)
MCV: 102 fL — ABNORMAL HIGH (ref 78.0–100.0)
Platelets: 105 10*3/uL — ABNORMAL LOW (ref 150–400)
RBC: 2.99 MIL/uL — ABNORMAL LOW (ref 3.87–5.11)
RDW: 14.3 % (ref 11.5–15.5)
WBC: 15.7 10*3/uL — ABNORMAL HIGH (ref 4.0–10.5)

## 2016-04-29 LAB — MAGNESIUM: Magnesium: 2.9 mg/dL — ABNORMAL HIGH (ref 1.7–2.4)

## 2016-04-29 MED ORDER — METHYLPREDNISOLONE SODIUM SUCC 125 MG IJ SOLR
80.0000 mg | Freq: Two times a day (BID) | INTRAMUSCULAR | Status: DC
Start: 2016-04-29 — End: 2016-05-03
  Administered 2016-04-29 – 2016-05-02 (×7): 80 mg via INTRAVENOUS
  Filled 2016-04-29 (×7): qty 2

## 2016-04-29 MED ORDER — PANTOPRAZOLE SODIUM 40 MG PO TBEC
40.0000 mg | DELAYED_RELEASE_TABLET | Freq: Two times a day (BID) | ORAL | Status: DC
  Administered 2016-04-29 – 2016-05-02 (×6): 40 mg via ORAL
  Filled 2016-04-29 (×6): qty 1

## 2016-04-29 MED ORDER — CLONIDINE HCL 0.1 MG PO TABS
0.1000 mg | ORAL_TABLET | Freq: Three times a day (TID) | ORAL | Status: DC
  Administered 2016-04-29 – 2016-05-07 (×23): 0.1 mg via ORAL
  Filled 2016-04-29 (×23): qty 1

## 2016-04-29 NOTE — Progress Notes (Signed)
Inpatient Rehabilitation  Patient was screened by Lanise Mergen for appropriateness for an Inpatient Acute Rehab consult.  At this time, we are recommending Inpatient Rehab consult.  Please order consult if you are agreeable.  Kailene Steinhart PT Inpatient Rehab Admissions Coordinator Cell [phone] Office [phone]    

## 2016-04-29 NOTE — Progress Notes (Signed)
PULMONARY / CRITICAL CARE MEDICINE   Name: [name] [name] MRN: [id number] DOB: Feb 14, 1941    ADMISSION DATE:  04/30/2016 CONSULTATION DATE:  04/23/2016  REFERRING MD:  Viann Fish, Teodora Medici  CHIEF COMPLAINT:  Can't breath  Brief Profile   75 yo obese wf quit smoking one year pta  admitted with NSTEMI, HTN urgency, and AECOPD. She has hx of CKD and difficulty IV access. She had Rt IJ CVL placed 5/19 >> developed Rt neck hematoma with airway compromise and transferred to ICU    SUBJECTIVE:  Very slow progress but lots of dry cough/ upper airway wheeze and also chornic ha       VITAL SIGNS: BP 182/92 mmHg  Pulse 110  Temp(Src) 98.5 F (36.9 C) (Oral)  Resp 19  Ht  (1.6 m)  Wt 234 lb 1.6 oz (106.187 kg)  BMI 41.48 kg/m2  SpO2 98%  HEMODYNAMICS:    VENTILATOR SETTINGS:    INTAKE / OUTPUT: I/O last 3 completed shifts: In: 480 [P.O.:420; Other:60] Out: 1975 [Urine:1975]  PHYSICAL EXAMINATION: General:  Elderly female. Obese. Sleeping until awoken.  Neuro:  CN in tact. A&O x4. Following commands. HEENT:  No stridor. Bruising over neck unchanged. Moist mucus membranes. Cardiovascular: Regular rate. Unable to appreciate JVD. No edema. Lungs: Mild apical wheezing referred upper airway sounds. Normal work of breathing on [place] oxygen. Otherwise CTAB.  Abdomen:  Soft. Protuberant. Nontender.  Integument:  Warm & dry. No rash on exposed skin. Multiple bruises on arms  LABS:  BMET  Recent Labs Lab 04/27/16 0526 04/28/16 0430 04/29/16 0525  NA 141 140 139  K 5.0 4.9 4.6  CL 104 103 100*  CO2 29 32 28  BUN 53* 50* 48*  CREATININE 1.65* 1.70* 1.69*  GLUCOSE 147* 120* 116*    Electrolytes  Recent Labs Lab 04/27/16 0526 04/28/16 0430 04/29/16 0525  CALCIUM 8.9 8.7* 9.0  MG 3.2*  --  2.9*  PHOS 5.2* 5.0* 3.9    CBC  Recent Labs Lab 04/27/16 0526 04/28/16 0430 04/29/16 0525  WBC 15.7* 15.4* 15.7*  HGB 10.9* 10.2* 10.0*  HCT 34.0* 32.2* 30.5*   PLT 128* 125* 105*    Coag's  Recent Labs Lab 04/23/16 1115  INR 1.13    Sepsis Markers No results for input(s): LATICACIDVEN, PROCALCITON, O2SATVEN in the last 168 hours.  ABG  Recent Labs Lab 04/23/16 1028 04/26/16 1253  PHART 7.424 7.506*  PCO2ART 43.0 36.5  PO2ART 87.6 65.0*    Liver Enzymes  Recent Labs Lab 04/27/16 0526 04/28/16 0430 04/29/16 0525  ALBUMIN 3.5 3.5 3.5    Cardiac Enzymes No results for input(s): TROPONINI, PROBNP in the last 168 hours.  Glucose  Recent Labs Lab 04/27/16 1600 04/27/16 2011 04/27/16 2326 04/28/16 0349 04/28/16 2101 04/29/16 0829  GLUCAP 140* 160* 126* 116* 211* 126*    Imaging No results found.   STUDIES:  5/19 Echo >> mod LVH, EF 65 to 70%, grade 1 diastolic CHF 5/22 Port CXR:  R IJ in good position. No new opacity. 5/24 Port CXR:  No new focal opacity. IJ in good position. 5/24 X-ray Neck:  Regressed density & stranding in neck. 5/24 CT neck w/o: Airway in tact. No hematoma. Indentation of posterior pharynx by anatomy. Mild stranding right anterior chest & bilateral chin could be due to cellulitis or bruising.  MICROBIOLOGY: MRSA PCR 5/19:  Negative   ANTIBIOTICS: None  SIGNIFICANT EVENTS: 5/19 Admit, cardiology consulted 5/21 Transfer to ICU with respiratory distress  from Rt neck hematoma 5/22 Transferred back to SDU, tolerating  5/24- worseing status resp failure, back to ICU  LINES/TUBES: 5/19 Rt IJ CVL >> PIV x2  ASSESSMENT / PLAN:  PULMONARY A: Acute Exacerbation of COPD R/o NEW ASPIRATION EVENT 5/24 Presumed sleep disordered breathing  P:   Titrate FiO2 to keep SpO2 > 92% Breo d/c'ed 5/27 due to hoarseness/ upper airway cough  Duoneb q6hr Continue Solu-Medrol 40mg  IV q24hr  CARDIOVASCULAR A:  NSTEMI - Cath currently on hold. Hypertensive Urgency - Resolved. Acute on Chronic Diastolic CHF H/O HTN  P:  Cardiology Following Continuing to hold ASA & Heparin gtt w/  hematoma Norvasc 10mg  QD, aldactone 25 mg QD, hydralazine 50mg  q8hr, imdur 120mg  QD, lipitor 40mg  QHS Added clonidine 5/27   RENAL A:   Acute on Chronic Renal Failure Stage IV - Creatinine stable.  P:    Monitoring electrolytes & renal function daily  GASTROINTESTINAL A:   H/O GERD Questionable Aspiration  P:   D2 per ST protonix bid ac    HEMATOLOGIC A:   Neck Hematoma - Resolving. Anemia - Mild. No obvious ongoing bleeding. Thrombocytopenia -  Improved overall  P:  Trending cell counts daily w/ CBC SCDs  INFECTIOUS A:   No evidence for infection.  P:   Monitor for fever  ENDOCRINE A:   Hyperglycemia - BG controlled.   P:   Accu-Checks q4hr SSI per resistant algorithm  NEUROLOGIC A:   H/O CVA H/O Anxiety H/O Depression  P:   Monitor clinically Ativan IV prn Dilaudid IV prn Tylenol prn Continue Lexapro at 10mg  daily (home dose 20mg ) PT consult  TODAY'S SUMMARY:. Patient continuing to tolerate diet. Anxiety seems to be significant contributor factor to her dyspnea. Continuing noninvasive positive pressure ventilation for increased work of breathing intermittently and also sleeping. Continuing current dose of Lexapro and when necessary IV Ativan for treatment of anxiety. Hypertension better addressed by adding clonidine at this point which will help with pain and anxiety as well.   Sandrea Hughs, MD Pulmonary and Critical Care Medicine [facility] Cell [phone] After 5:30 PM or weekends, call [phone]

## 2016-04-29 NOTE — Evaluation (Signed)
Physical Therapy Evaluation Patient Details Name: [name] [name] MRN: [id number] DOB: [birth year] Today's Date: 04/29/2016   History of Present Illness  Pt adm with NSTEMI and COPD exacerbation. Developed neck hematoma after line placement. Pt has been on/off bipap. PMH - HTN, shouler surgery, COPD, gout obesity, DVT  Clinical Impression  Pt admitted with above diagnosis and presents to PT with functional limitations due to deficits listed below (See PT problem list). Pt needs skilled PT to maximize independence and safety to allow discharge to CIR. Feel pt could benefit from intensive rehab and also recommend OT consult. Pt with wheezing with activity with SpO2 >95%. HR to 120's with activity.    Follow Up Recommendations CIR    Equipment Recommendations  Other (comment) (To be assessed)    Recommendations for Other Services OT consult     Precautions / Restrictions Precautions Precautions: Fall Restrictions Weight Bearing Restrictions: No      Mobility  Bed Mobility Overal bed mobility: Needs Assistance Bed Mobility: Supine to Sit     Supine to sit: Mod assist     General bed mobility comments: Assist to bring legs off bed and elevate trunk into sitting  Transfers Overall transfer level: Needs assistance Equipment used: Rolling walker (2 wheeled) Transfers: Sit to/from [employer] Sit to Stand: +2 physical assistance;Mod assist Stand pivot transfers: +2 physical assistance;Mod assist       General transfer comment: Assist to bring hips and trunk up and to support hips while pivoting to chair with walker.  Ambulation/Gait             General Gait Details: Only small pivotal steps with transfer to chair.  Stairs            Wheelchair Mobility    Modified Rankin (Stroke Patients Only)       Balance Overall balance assessment: Needs assistance Sitting-balance support: No upper extremity supported;Feet supported Sitting balance-Leahy  Scale: Good     Standing balance support: Bilateral upper extremity supported Standing balance-Leahy Scale: Poor Standing balance comment: walker and 2 person assist for standing                             Pertinent Vitals/Pain Pain Assessment: No/denies pain    Home Living Family/patient expects to be discharged to:: Private residence Living Arrangements: Alone Available Help at Discharge: Family;Available PRN/intermittently Type of Home: Mobile home Home Access: Stairs to enter Entrance Stairs-Rails: Right;Left;Can reach both Entrance Stairs-Number of Steps: 4 Home Layout: One level Home Equipment: Grab bars - tub/shower;Shower seat      Prior Function Level of Independence: Independent               Hand Dominance   Dominant Hand: Right    Extremity/Trunk Assessment   Upper Extremity Assessment: Defer to OT evaluation           Lower Extremity Assessment: Generalized weakness         Communication   Communication: No difficulties  Cognition Arousal/Alertness: Awake/alert Behavior During Therapy: WFL for tasks assessed/performed Overall Cognitive Status: Within Functional Limits for tasks assessed                      General Comments      Exercises        Assessment/Plan    PT Assessment Patient needs continued PT services  PT Diagnosis Difficulty walking;Generalized weakness   PT Problem List Decreased strength;Decreased  activity tolerance;Decreased balance;Decreased mobility;Cardiopulmonary status limiting activity;Obesity  PT Treatment Interventions DME instruction;Gait training;Functional mobility training;Therapeutic activities;Balance training;Therapeutic exercise;Patient/family education   PT Goals (Current goals can be found in the Care Plan section) Acute Rehab PT Goals Patient Stated Goal: return home PT Goal Formulation: With patient Time For Goal Achievement: June 09, 2016 Potential to Achieve Goals: Good     Frequency Min 3X/week   Barriers to discharge Decreased caregiver support lives alone    Co-evaluation               End of Session Equipment Utilized During Treatment: Gait belt;Oxygen Activity Tolerance: Patient limited by fatigue;Patient tolerated treatment well Patient left: in chair;with call bell/phone within reach;with chair alarm set Nurse Communication: Mobility status (nurse tech)         Time: 0258-5277 PT Time Calculation (min) (ACUTE ONLY): 18 min   Charges:   PT Evaluation $PT Eval Moderate Complexity: 1 Procedure     PT G Codes:        Timmie Dugue 2016/05/26, 12:32 PM [facility] PT [phone]

## 2016-04-30 DIAGNOSIS — S1093XD Contusion of unspecified part of neck, subsequent encounter: Secondary | ICD-10-CM

## 2016-04-30 LAB — CBC
HCT: 29.7 % — ABNORMAL LOW (ref 36.0–46.0)
Hemoglobin: 9.6 g/dL — ABNORMAL LOW (ref 12.0–15.0)
MCH: 32.5 pg (ref 26.0–34.0)
MCHC: 32.3 g/dL (ref 30.0–36.0)
MCV: 100.7 fL — AB (ref 78.0–100.0)
PLATELETS: 95 10*3/uL — AB (ref 150–400)
RBC: 2.95 MIL/uL — ABNORMAL LOW (ref 3.87–5.11)
RDW: 14.1 % (ref 11.5–15.5)
WBC: 13.8 10*3/uL — ABNORMAL HIGH (ref 4.0–10.5)

## 2016-04-30 LAB — RENAL FUNCTION PANEL
ALBUMIN: 3.4 g/dL — AB (ref 3.5–5.0)
ANION GAP: 8 (ref 5–15)
BUN: 42 mg/dL — AB (ref 6–20)
CALCIUM: 9 mg/dL (ref 8.9–10.3)
CO2: 28 mmol/L (ref 22–32)
CREATININE: 1.64 mg/dL — AB (ref 0.44–1.00)
Chloride: 101 mmol/L (ref 101–111)
GFR calc Af Amer: 35 mL/min — ABNORMAL LOW (ref >60)
GFR calc non Af Amer: 30 mL/min — ABNORMAL LOW (ref >60)
GLUCOSE: 164 mg/dL — AB (ref 65–99)
PHOSPHORUS: 3.7 mg/dL (ref 2.5–4.6)
Potassium: 4.8 mmol/L (ref 3.5–5.1)
SODIUM: 137 mmol/L (ref 135–145)

## 2016-04-30 LAB — GLUCOSE, CAPILLARY
GLUCOSE-CAPILLARY: 151 mg/dL — AB (ref 65–99)
Glucose-Capillary: 125 mg/dL — ABNORMAL HIGH (ref 65–99)
Glucose-Capillary: 143 mg/dL — ABNORMAL HIGH (ref 65–99)
Glucose-Capillary: 145 mg/dL — ABNORMAL HIGH (ref 65–99)
Glucose-Capillary: 143 mg/dL — ABNORMAL HIGH (ref 65–99)
Glucose-Capillary: 149 mg/dL — ABNORMAL HIGH (ref 65–99)
Glucose-Capillary: 149 mg/dL — ABNORMAL HIGH (ref 65–99)

## 2016-04-30 LAB — MAGNESIUM: Magnesium: 2.7 mg/dL — ABNORMAL HIGH (ref 1.7–2.4)

## 2016-04-30 NOTE — Progress Notes (Signed)
PULMONARY / CRITICAL CARE MEDICINE   Name: [name] [name] MRN: [id number] DOB: [birth year]    ADMISSION DATE:  04/10/2016 CONSULTATION DATE:  04/23/2016  REFERRING MD:  Viann Fish, Teodora Medici  CHIEF COMPLAINT:  Can't breath  Brief Profile   75 yo obese wf quit smoking one year pta  admitted with NSTEMI, HTN urgency, and AECOPD. She has hx of CKD and difficulty IV access. She had Rt IJ CVL placed 5/19 >> developed Rt neck hematoma with airway compromise and transferred to ICU    SUBJECTIVE:  Very slow progress - still some dry cough/ pseudowheeze better with plm      VITAL SIGNS: BP 137/75 mmHg  Pulse 96  Temp(Src) 98.3 F (36.8 C) (Oral)  Resp 17  Ht 5\' 3"  (1.6 m)  Wt 231 lb 12.8 oz (105.144 kg)  BMI 41.07 kg/m2  SpO2 99%  2lpm NP  HEMODYNAMICS:    VENTILATOR SETTINGS:    INTAKE / OUTPUT: I/O last 3 completed shifts: In: 680 [P.O.:680] Out: 1850 [Urine:1850]  PHYSICAL EXAMINATION: General:  Elderly female. Obese.  Neuro:  CN in tact. A&O x4. Following commands. HEENT:  No stridor. Bruising over neck unchanged. Moist mucus membranes. Cardiovascular: Regular rate. Unable to appreciate JVD. No edema. Lungs: Mild apical wheezing referred upper airway sounds. Normal work of breathing on [place] oxygen. Otherwise CTAB.  Abdomen:  Soft. Protuberant. Nontender.  Integument:  Warm & dry. No rash on exposed skin. Multiple bruises on arms  LABS:  BMET  Recent Labs Lab 04/28/16 0430 04/29/16 0525 04/30/16 0542  NA 140 139 137  K 4.9 4.6 4.8  CL 103 100* 101  CO2 32 28 28  BUN 50* 48* 42*  CREATININE 1.70* 1.69* 1.64*  GLUCOSE 120* 116* 164*    Electrolytes  Recent Labs Lab 04/27/16 0526 04/28/16 0430 04/29/16 0525 04/30/16 0542  CALCIUM 8.9 8.7* 9.0 9.0  MG 3.2*  --  2.9* 2.7*  PHOS 5.2* 5.0* 3.9 3.7    CBC  Recent Labs Lab 04/28/16 0430 04/29/16 0525 04/30/16 0542  WBC 15.4* 15.7* 13.8*  HGB 10.2* 10.0* 9.6*  HCT 32.2* 30.5* 29.7*  PLT  125* 105* 95*    Coag's No results for input(s): APTT, INR in the last 168 hours.  Sepsis Markers No results for input(s): LATICACIDVEN, PROCALCITON, O2SATVEN in the last 168 hours.  ABG  Recent Labs Lab 04/26/16 1253  PHART 7.506*  PCO2ART 36.5  PO2ART 65.0*    Liver Enzymes  Recent Labs Lab 04/28/16 0430 04/29/16 0525 04/30/16 0542  ALBUMIN 3.5 3.5 3.4*    Cardiac Enzymes No results for input(s): TROPONINI, PROBNP in the last 168 hours.  Glucose  Recent Labs Lab 04/29/16 0829 04/29/16 1317 04/29/16 1610 04/29/16 2101 04/30/16 0736 04/30/16 1245  GLUCAP 126* 130* 153* 184* 145* 143*    Imaging No results found.   STUDIES:  5/19 Echo >> mod LVH, EF 65 to 70%, grade 1 diastolic CHF 5/22 Port CXR:  R IJ in good position. No new opacity. 5/24 Port CXR:  No new focal opacity. IJ in good position. 5/24 X-ray Neck:  Regressed density & stranding in neck. 5/24 CT neck w/o: Airway in tact. No hematoma. Indentation of posterior pharynx by anatomy. Mild stranding right anterior chest & bilateral chin could be due to cellulitis or bruising.  MICROBIOLOGY: MRSA PCR 5/19:  Negative   ANTIBIOTICS: None  SIGNIFICANT EVENTS: 5/19 Admit, cardiology consulted 5/21 Transfer to ICU with respiratory distress from Rt neck hematoma 5/22  Transferred back to SDU, tolerating  5/24- worseing status resp failure, back to ICU  LINES/TUBES: 5/19 Rt IJ CVL >> PIV x2  ASSESSMENT / PLAN:  PULMONARY A: Acute Exacerbation of COPD R/o NEW ASPIRATION EVENT 5/24 Presumed sleep disordered breathing  P:   Titrate FiO2 to keep SpO2 > 92% Breo d/c'ed 5/27 due to hoarseness/ upper airway cough  Duoneb q6hr Continue Solu-Medrol  IV q24hr  CARDIOVASCULAR A:  NSTEMI - Cath currently on hold. Hypertensive Urgency - Resolved. Acute on Chronic Diastolic CHF H/O HTN  P:  Cardiology Following Continuing to hold ASA & Heparin gtt w/ hematoma Norvasc  QD, aldactone  25 mg QD, hydralazine  q8hr, imdur  QD, lipitor  QHS Added clonidine 5/27 > improved 5/28  RENAL A:   Acute on Chronic Renal Failure Stage IV - Creatinine stable.  P:    Monitoring electrolytes & renal function daily  GASTROINTESTINAL A:   H/O GERD Questionable Aspiration  P:   D2 per ST protonix bid ac    HEMATOLOGIC A:   Neck Hematoma - Resolving. Anemia - Mild. No obvious ongoing bleeding. Thrombocytopenia -  Improved overall  P:  Trending cell counts daily w/ CBC SCDs  INFECTIOUS A:   No evidence for infection.  P:   Monitor for fever  ENDOCRINE A:   Hyperglycemia - BG controlled.   P:   Accu-Checks q4hr SSI per resistant algorithm  NEUROLOGIC A:   H/O CVA H/O Anxiety H/O Depression  P:   Monitor clinically Ativan IV prn Dilaudid IV prn Tylenol prn Continue Lexapro at  daily (home dose ) PT consult  TODAY'S SUMMARY:. Patient continuing to tolerate diet. Anxiety seems to be significant contributor factor to her dyspnea. Continuing noninvasive positive pressure ventilation for increased work of breathing intermittently and also sleeping. Continuing current dose of Lexapro and when necessary IV Ativan for treatment of anxiety. Hypertension better addressed by adding clonidine at this point which will help with pain and anxiety as well.  Still with prominent pseudowheeze on exam so avoid DPI's in this setting   Sandrea Hughs, MD Pulmonary and Critical Care Medicine [facility] Cell [phone] After 5:30 PM or weekends, call [phone]

## 2016-05-01 DIAGNOSIS — R1310 Dysphagia, unspecified: Secondary | ICD-10-CM | POA: Insufficient documentation

## 2016-05-01 DIAGNOSIS — D62 Acute posthemorrhagic anemia: Secondary | ICD-10-CM

## 2016-05-01 DIAGNOSIS — D696 Thrombocytopenia, unspecified: Secondary | ICD-10-CM | POA: Insufficient documentation

## 2016-05-01 DIAGNOSIS — R0689 Other abnormalities of breathing: Secondary | ICD-10-CM

## 2016-05-01 DIAGNOSIS — D72829 Elevated white blood cell count, unspecified: Secondary | ICD-10-CM

## 2016-05-01 DIAGNOSIS — I10 Essential (primary) hypertension: Secondary | ICD-10-CM

## 2016-05-01 DIAGNOSIS — R221 Localized swelling, mass and lump, neck: Secondary | ICD-10-CM

## 2016-05-01 DIAGNOSIS — R0602 Shortness of breath: Secondary | ICD-10-CM

## 2016-05-01 DIAGNOSIS — R0600 Dyspnea, unspecified: Secondary | ICD-10-CM | POA: Insufficient documentation

## 2016-05-01 LAB — RENAL FUNCTION PANEL
Albumin: 3.3 g/dL — ABNORMAL LOW (ref 3.5–5.0)
Anion gap: 7 (ref 5–15)
BUN: 44 mg/dL — AB (ref 6–20)
CHLORIDE: 104 mmol/L (ref 101–111)
CO2: 28 mmol/L (ref 22–32)
Calcium: 8.6 mg/dL — ABNORMAL LOW (ref 8.9–10.3)
Creatinine, Ser: 1.71 mg/dL — ABNORMAL HIGH (ref 0.44–1.00)
GFR calc Af Amer: 33 mL/min — ABNORMAL LOW (ref >60)
GFR calc non Af Amer: 28 mL/min — ABNORMAL LOW (ref >60)
GLUCOSE: 136 mg/dL — AB (ref 65–99)
POTASSIUM: 5.1 mmol/L (ref 3.5–5.1)
Phosphorus: 4.3 mg/dL (ref 2.5–4.6)
Sodium: 139 mmol/L (ref 135–145)

## 2016-05-01 LAB — CBC
HEMATOCRIT: 28.1 % — AB (ref 36.0–46.0)
Hemoglobin: 9.1 g/dL — ABNORMAL LOW (ref 12.0–15.0)
MCH: 32.6 pg (ref 26.0–34.0)
MCHC: 32.4 g/dL (ref 30.0–36.0)
MCV: 100.7 fL — AB (ref 78.0–100.0)
Platelets: 85 10*3/uL — ABNORMAL LOW (ref 150–400)
RBC: 2.79 MIL/uL — ABNORMAL LOW (ref 3.87–5.11)
RDW: 13.9 % (ref 11.5–15.5)
WBC: 14.9 10*3/uL — ABNORMAL HIGH (ref 4.0–10.5)

## 2016-05-01 LAB — GLUCOSE, CAPILLARY
GLUCOSE-CAPILLARY: 135 mg/dL — AB (ref 65–99)
GLUCOSE-CAPILLARY: 143 mg/dL — AB (ref 65–99)
GLUCOSE-CAPILLARY: 288 mg/dL — AB (ref 65–99)
Glucose-Capillary: 140 mg/dL — ABNORMAL HIGH (ref 65–99)

## 2016-05-01 LAB — MAGNESIUM: Magnesium: 2.9 mg/dL — ABNORMAL HIGH (ref 1.7–2.4)

## 2016-05-01 LAB — TROPONIN I: Troponin I: 0.23 ng/mL — ABNORMAL HIGH (ref <0.031)

## 2016-05-01 NOTE — Progress Notes (Signed)
PULMONARY / CRITICAL CARE MEDICINE   Name: [name] [name] MRN: [id number] DOB: 1941-01-24    ADMISSION DATE:  04/11/2016 CONSULTATION DATE:  04/23/2016  REFERRING MD:  Viann Fish, Teodora Medici  CHIEF COMPLAINT:  Can't breath  Brief Profile   75 yo obese wf quit smoking one year pta  admitted with NSTEMI, HTN urgency, and AECOPD. She has hx of CKD and difficulty IV access. She had Rt IJ CVL placed 5/19 >> developed Rt neck hematoma with airway compromise and transferred to ICU    SUBJECTIVE:  Admits she gets tense and anxious with tight soreness bilateral mandible up to forehead, unless she gets anxiolytic or pain pill before meals. Knows to ask for this.     VITAL SIGNS: BP 144/109 mmHg  Pulse 89  Temp(Src) 98.1 F (36.7 C) (Oral)  Resp 15  Ht 5\' 3"  (1.6 m)  Wt 106.232 kg (234 lb 3.2 oz)  BMI 41.50 kg/m2  SpO2 97%  2lpm NP  HEMODYNAMICS:    VENTILATOR SETTINGS:    INTAKE / OUTPUT: I/O last 3 completed shifts: In: 1400 [P.O.:1400] Out: 975 [Urine:975]  PHYSICAL EXAMINATION: General:  Elderly female. Obese.  Neuro:  CN in tact. A&O x4. Following commands. HEENT:  No stridor. Bruising over neck unchanged. Moist mucus membranes. Cardiovascular: Regular rate. Unable to appreciate JVD. No edema. Lungs: Effort related expiratory wheeze, sounds like pseudo-wheeze.   Abdomen:  Soft. Protuberant. Nontender.  Integument:  Warm & dry. No rash on exposed skin. Multiple bruises on arms  LABS:  BMET  Recent Labs Lab 04/29/16 0525 04/30/16 0542 05/01/16 0358  NA 139 137 139  K 4.6 4.8 5.1  CL 100* 101 104  CO2 28 28 28   BUN 48* 42* 44*  CREATININE 1.69* 1.64* 1.71*  GLUCOSE 116* 164* 136*    Electrolytes  Recent Labs Lab 04/29/16 0525 04/30/16 0542 05/01/16 0353 05/01/16 0358  CALCIUM 9.0 9.0  --  8.6*  MG 2.9* 2.7* 2.9*  --   PHOS 3.9 3.7  --  4.3    CBC  Recent Labs Lab 04/29/16 0525 04/30/16 0542 05/01/16 0353  WBC 15.7* 13.8* 14.9*  HGB  10.0* 9.6* 9.1*  HCT 30.5* 29.7* 28.1*  PLT 105* 95* 85*    Coag's No results for input(s): APTT, INR in the last 168 hours.  Sepsis Markers No results for input(s): LATICACIDVEN, PROCALCITON, O2SATVEN in the last 168 hours.  ABG  Recent Labs Lab 04/26/16 1253  PHART 7.506*  PCO2ART 36.5  PO2ART 65.0*    Liver Enzymes  Recent Labs Lab 04/29/16 0525 04/30/16 0542 05/01/16 0358  ALBUMIN 3.5 3.4* 3.3*    Cardiac Enzymes No results for input(s): TROPONINI, PROBNP in the last 168 hours.  Glucose  Recent Labs Lab 04/29/16 2101 04/30/16 0736 04/30/16 1245 04/30/16 1658 04/30/16 2136 05/01/16 0758  GLUCAP 184* 145* 143* 149* 149* 140*    Imaging No results found.   STUDIES:  5/19 Echo >> mod LVH, EF 65 to 70%, grade 1 diastolic CHF 5/22 Port CXR:  R IJ in good position. No new opacity. 5/24 Port CXR:  No new focal opacity. IJ in good position. 5/24 X-ray Neck:  Regressed density & stranding in neck. 5/24 CT neck w/o: Airway in tact. No hematoma. Indentation of posterior pharynx by anatomy. Mild stranding right anterior chest & bilateral chin could be due to cellulitis or bruising.  MICROBIOLOGY: MRSA PCR 5/19:  Negative   ANTIBIOTICS: None  SIGNIFICANT EVENTS: 5/19 Admit, cardiology consulted 5/21  Transfer to ICU with respiratory distress from Rt neck hematoma 5/22 Transferred back to SDU, tolerating  5/24- worseing status resp failure, back to ICU  LINES/TUBES: 5/19 Rt IJ CVL >> PIV x2  ASSESSMENT / PLAN:  PULMONARY A: Acute Exacerbation of COPD R/o NEW ASPIRATION EVENT 5/24 Presumed sleep disordered breathing  P:   Titrate FiO2 to keep SpO2 > 92% Breo d/c'ed 5/27 due to hoarseness/ upper airway cough  Duoneb q6hr Continue Solu-Medrol  IV q24hr  CARDIOVASCULAR A:  NSTEMI - Cath currently on hold. Hypertensive Urgency - Resolved. Acute on Chronic Diastolic CHF H/O HTN  P:  Cardiology Following Continuing to hold ASA &  Heparin gtt w/ hematoma Norvasc  QD, aldactone 25 mg QD, hydralazine  q8hr, imdur  QD, lipitor  QHS Added clonidine 5/27 > improved 5/28  RENAL A:   Acute on Chronic Renal Failure Stage IV - Creatinine stable.  P:    Monitoring electrolytes & renal function daily  GASTROINTESTINAL A:   H/O GERD Questionable Aspiration  P:   D2 per ST protonix bid ac    HEMATOLOGIC A:   Neck Hematoma - Resolving. Anemia - Mild. No obvious ongoing bleeding, but trending down Thrombocytopenia -  Improved overall, still low  P:  Trending cell counts daily w/ CBC SCDs  INFECTIOUS A:   No evidence for infection.  P:   Monitor for fever  ENDOCRINE A:   Hyperglycemia - BG controlled.   P:   Accu-Checks q4hr SSI per resistant algorithm  NEUROLOGIC A:   H/O CVA H/O Anxiety H/O Depression  P:   Monitor clinically Ativan IV prn Dilaudid IV prn Tylenol prn Continue Lexapro at  daily (home dose ) PT consult  TODAY'S SUMMARY:. Patient continuing to tolerate diet. Anxiety seems to be significant contributor factor to her dyspnea. Continuing noninvasive positive pressure ventilation for increased work of breathing intermittently and also sleeping. Continuing current dose of Lexapro and when necessary IV Ativan for treatment of anxiety. Hypertension better addressed by adding clonidine at this point which will help with pain and anxiety as well. Anemia and thrombocytopenia are not yet resolving.  Still with prominent pseudowheeze on exam so avoid DPI's in this setting   CD Mammie Lorenzo, MD Pulmonary and Critical Care Medicine [facility] [place] [phone] After 3:00 PM or weekends, call [phone]

## 2016-05-01 NOTE — Progress Notes (Signed)
Speech Language Pathology Treatment: Dysphagia  Patient Details Name: [name] [name] MRN: [id number] DOB: 02-25-1941 Today's Date: 05/01/2016 Time: 6756-1254 SLP Time Calculation (min) (ACUTE ONLY): 9 min  Assessment / Plan / Recommendation Clinical Impression  Pt with resolved dysphagia - appetite remains limited per her report, however, mastication is WNL, swallow response is timely, and there are no overt s/s of aspiration associated with consumption of solids and liquids.  Lungs are clear - still with anxiety, wheezing.  Recommend advancing diet to regular consistencies - perhaps diet change will improve appetite.  No further SLP f/u is warranted.    HPI HPI: 75 yo female admitted with NSTEMI, HTN urgency, and AECOPD.She has hx of CKD and difficulty IV access. She had Rt IJ CVL placed 5/19 >> developed Rt neck hematoma with airway compromise. CT 5/24 shows intact airway without hematoma identified. Prevertebral soft tissue swelling due to patient size and medial deviation of the right carotid artery causing indentation of the posterior oropharynx.      SLP Plan  All goals met     Recommendations  Diet recommendations: Regular;Thin liquid Liquids provided via: Cup;Straw Medication Administration: Whole meds with liquid Supervision: Patient able to self feed Compensations: Small sips/bites Postural Changes and/or Swallow Maneuvers: Seated upright 90 degrees             Oral Care Recommendations: Oral care BID Plan: All goals met     GO              [name] [name], [place] CCC/SLP Pager [phone]   [name] 05/01/2016, 9:53 AM

## 2016-05-01 NOTE — Consult Note (Signed)
Physical Medicine and Rehabilitation Consult Reason for Consult: Debility Referring Physician: Dr. Sherene Sires.    HPI: [name] [name] is a 75 y.o. female with history of HTN, COPD, obesity, SOB, dysphagia who was admitted via RH on 04/16/2016 with due to AECOPD and developed CP with EKG changes and elevated troponin on 05/19, acute on chronic CHF as wellas AKI.  She was transferred to [facility] for management of NSTEMI. She  place on NTG and heparin drip with plans for work up as medical issues stabilized.  She developed hematoma at right IJ site extending to right neck with airway compromise and lethargy. Heparin and ASA held and monitored for stability. She continued to report SOB and CT chest on 05/24 was negative for hematoma--soft tissue swelling of 5/21 resolved and prevertebral soft tissue swelling due to large patient size and medial deviation of right ICA causing indentation on posterior oropharynx.  Swallow evaluation done and patient placed on dysphagia 2, thin liquids, and advanced to regular diet per patient.  SOB treated with BIPAP and respiratory status improving. PT evaluation done this weekend and patient noted to be debilitated. CIR recommended for follow up therapy.   Patient lives alone and was independent PTA.    Review of Systems  Constitutional: Positive for malaise/fatigue. Negative for fever and chills.  Respiratory:       SOB at rest and severe with minimal exertion  Cardiovascular: Negative for palpitations.  Musculoskeletal: Positive for back pain.  Neurological: Negative for dizziness, sensory change and focal weakness.  All other systems reviewed and are negative.     Past Medical History  Diagnosis Date  . Hypertension   . History of blood clots     Right leg & lung  . High cholesterol   . COPD (chronic obstructive pulmonary disease) (HCC)   . Asthma   . GERD (gastroesophageal reflux disease)   . PONV (postoperative nausea and vomiting)   . MI (myocardial  infarction) (HCC)     X 2  . Heart murmur   . Shortness of breath   . Stroke (HCC)   . Anxiety   . Depression   . Difficulty swallowing solids   . Stomach ulcer   . Gout   . History of shingles   . Rash     MD aware and pt was given Monistat to use; on stomach, neck (hairline)  . Bruises easily   . Renal disorder     Past Surgical History  Procedure Laterality Date  . Abdominal hysterectomy    . Breast surgery Left   . Appendectomy    . Colonoscopy w/ polypectomy    . Upper gi endoscopy    . Esophagus surgery      Esophagus stretched during endoscopy  . Ivc filter    . Cataract extraction, bilateral Bilateral   . Shoulder hemi-arthroplasty Right 04/16/2015    Procedure: RIGHT SHOULDER HEMI-ARTHROPLASTY WITH BIOPSY;  Surgeon: Beverely Low, MD;  Location: [facility] OR;  Service: Orthopedics;  Laterality: Right;    Family History  Problem Relation Age of Onset  . Heart attack Father     in his 7s  . Congestive Heart Failure Mother     Social History:  reports that she quit smoking about 3 years ago. Her smoking use included Cigarettes. She has a 32.5 pack-year smoking history. She does not have any smokeless tobacco history on file. She reports that she does not drink alcohol or use illicit drugs.  Allergies  Allergen Reactions  . Ciprofloxacin     rash    Medications Prior to Admission  Medication Sig Dispense Refill  . aspirin EC 81 MG EC tablet Take 1 tablet (81 mg total) by mouth daily.    Marland Kitchen atorvastatin (LIPITOR) 40 MG tablet Take 40 mg by mouth daily at 6 PM.     . bisoprolol (ZEBETA) 10 MG tablet Take one tablet daily  2  . BREO ELLIPTA 200-25 MCG/INH AEPB Inhale 1 puff into the lungs daily.     . calcitRIOL (ROCALTROL) 0.25 MCG capsule Take 2 capsules by mouth daily.     Marland Kitchen doxazosin (CARDURA) 4 MG tablet Take 1 tablet by mouth daily.    . ergocalciferol (VITAMIN D2) 50000 units capsule Take 50,000 Units by mouth once a week.    . escitalopram (LEXAPRO) 20 MG  tablet TAKE 1 TABLET(20 MG) BY MOUTH DAILY 90 tablet 0  . fluticasone (CUTIVATE) 0.05 % cream Apply 1 application topically 2 (two) times daily.    . furosemide (LASIX) 80 MG tablet Take 80 mg by mouth daily.     . isosorbide mononitrate (IMDUR) 30 MG 24 hr tablet Take 3 tablets (90 mg total) by mouth daily. 90 tablet 3  . montelukast (SINGULAIR) 10 MG tablet Take 10 mg by mouth at bedtime.    . nitroGLYCERIN (NITROSTAT) 0.4 MG SL tablet Place 0.4 mg under the tongue every 5 (five) minutes as needed. Chest pain  0  . omeprazole (PRILOSEC) 20 MG capsule Take 20 mg by mouth daily.    Marland Kitchen spironolactone (ALDACTONE) 25 MG tablet Take 25 mg by mouth daily.    . traZODone (DESYREL) 50 MG tablet Take 50 mg by mouth at bedtime.      Home: Home Living Family/patient expects to be discharged to:: Private residence Living Arrangements: Alone Available Help at Discharge: Family, Available PRN/intermittently Type of Home: Mobile home Home Access: Stairs to enter Entrance Stairs-Number of Steps: 4 Entrance Stairs-Rails: Right, Left, Can reach both Home Layout: One level Bathroom Shower/Tub: Engineer, manufacturing systems: Standard Home Equipment: Grab bars - tub/shower, Banker History: Prior Function Level of Independence: Independent Functional Status:  Mobility: Bed Mobility Overal bed mobility: Needs Assistance Bed Mobility: Supine to Sit Supine to sit: Mod assist General bed mobility comments: Assist to bring legs off bed and elevate trunk into sitting Transfers Overall transfer level: Needs assistance Equipment used: Rolling walker (2 wheeled) Transfers: Sit to/from Stand, Stand Pivot Transfers Sit to Stand: +2 physical assistance, Mod assist Stand pivot transfers: +2 physical assistance, Mod assist General transfer comment: Assist to bring hips and trunk up and to support hips while pivoting to chair with walker. Ambulation/Gait General Gait Details: Only small  pivotal steps with transfer to chair.    ADL:    Cognition: Cognition Overall Cognitive Status: Within Functional Limits for tasks assessed Orientation Level: Oriented X4 Cognition Arousal/Alertness: Awake/alert Behavior During Therapy: WFL for tasks assessed/performed Overall Cognitive Status: Within Functional Limits for tasks assessed  Blood pressure 154/71, pulse 89, temperature 98.1 F (36.7 C), temperature source Oral, resp. rate 15, height 5\' 3"  (1.6 m), weight 106.232 kg (234 lb 3.2 oz), SpO2 97 %. Physical Exam  Vitals reviewed. Constitutional: She is oriented to person, place, and time. She appears well-developed.  Obese  HENT:  Head: Normocephalic.  Large hematoma on neck  Eyes: Conjunctivae and EOM are normal.  Neck: Normal range of motion. No tracheal deviation present.  Cardiovascular: Normal  rate and regular rhythm.   Murmur heard. Respiratory: She has wheezes.  Labored breathing + [place]  GI: Soft. Bowel sounds are normal.  Musculoskeletal: Normal range of motion.  Edema in neck and bilateral upper extremities No tenderness  Neurological: She is alert and oriented to person, place, and time.  Motor: 4+/5 grossly throughout  Skin: Skin is warm and dry.  Scattered hematomas  Psychiatric: Her behavior is normal.  Flat affect    Results for orders placed or performed during the hospital encounter of 04/12/2016 (from the past 24 hour(s))  Glucose, capillary     Status: Abnormal   Collection Time: 04/30/16 12:45 PM  Result Value Ref Range   Glucose-Capillary 143 (H) 65 - 99 mg/dL  Glucose, capillary     Status: Abnormal   Collection Time: 04/30/16  4:58 PM  Result Value Ref Range   Glucose-Capillary 149 (H) 65 - 99 mg/dL  Glucose, capillary     Status: Abnormal   Collection Time: 04/30/16  9:36 PM  Result Value Ref Range   Glucose-Capillary 149 (H) 65 - 99 mg/dL  CBC     Status: Abnormal   Collection Time: 05/01/16  3:53 AM  Result Value Ref Range   WBC  14.9 (H) 4.0 - 10.5 K/uL   RBC 2.79 (L) 3.87 - 5.11 MIL/uL   Hemoglobin 9.1 (L) 12.0 - 15.0 g/dL   HCT 11.9 (L) 14.7 - 82.9 %   MCV 100.7 (H) 78.0 - 100.0 fL   MCH 32.6 26.0 - 34.0 pg   MCHC 32.4 30.0 - 36.0 g/dL   RDW 56.2 13.0 - 86.5 %   Platelets 85 (L) 150 - 400 K/uL  Magnesium     Status: Abnormal   Collection Time: 05/01/16  3:53 AM  Result Value Ref Range   Magnesium 2.9 (H) 1.7 - 2.4 mg/dL  Renal function panel     Status: Abnormal   Collection Time: 05/01/16  3:58 AM  Result Value Ref Range   Sodium 139 135 - 145 mmol/L   Potassium 5.1 3.5 - 5.1 mmol/L   Chloride 104 101 - 111 mmol/L   CO2 28 22 - 32 mmol/L   Glucose, Bld 136 (H) 65 - 99 mg/dL   BUN 44 (H) 6 - 20 mg/dL   Creatinine, Ser 7.84 (H) 0.44 - 1.00 mg/dL   Calcium 8.6 (L) 8.9 - 10.3 mg/dL   Phosphorus 4.3 2.5 - 4.6 mg/dL   Albumin 3.3 (L) 3.5 - 5.0 g/dL   GFR calc non Af Amer 28 (L) >60 mL/min   GFR calc Af Amer 33 (L) >60 mL/min   Anion gap 7 5 - 15   No results found.  Assessment/Plan: Diagnosis: Debility Labs and images independently reviewed.  Records reviewed and summated above.  1. Does the need for close, 24 hr/day medical supervision in concert with the patient's rehab needs make it unreasonable for this patient to be served in a less intensive setting? Yes 2. Co-Morbidities requiring supervision/potential complications:  COPD (cont to monitor RR and O2 sats with increased physical activity), morbid obesity (Body mass index is 41.5 kg/(m^2)., diet and exercise education, encourage weight loss to increase endurance and promote overall health), SOB (cont O2 as necessary), dysphagia (continue SLP, advance diet as tolerated), acute on chronic CHF (Monitor in accordance with increased physical activity and avoid UE resistance excercises), AKI (avoid nephrotoxic meds), HTN (monitor and provide prns in accordance with increased physical exertion and pain), leukocytosis (cont to monitor for  signs and symptoms of  infection, further workup if indicated), Thrombocytopenia (trending down, < 60,000/mm3 no resistive exercise), ABLA (transfuse if necessary to ensure appropriate perfusion for increased activity tolerance) 3. Due to safety, skin/wound care, disease management and patient education, does the patient require 24 hr/day rehab nursing? Yes 4. Does the patient require coordinated care of a physician, rehab nurse, PT (1-2 hrs/day, 5 days/week), OT (1-2 hrs/day, 5 days/week) and SLP (1-2 hrs/day, 5 days/week) to address physical and functional deficits in the context of the above medical diagnosis(es)? Yes Addressing deficits in the following areas: balance, endurance, locomotion, strength, transferring, bathing, dressing, toileting, swallowing and psychosocial support 5. Can the patient actively participate in an intensive therapy program of at least 3 hrs of therapy per day at least 5 days per week? No 6. The potential for patient to make measurable gains while on inpatient rehab is good 7. Anticipated functional outcomes upon discharge from inpatient rehab are supervision and min assist  with PT, supervision and min assist with OT, independent and modified independent with SLP. 8. Estimated rehab length of stay to reach the above functional goals is: 16-19 days. 9. Does the patient have adequate social supports and living environment to accommodate these discharge functional goals? No 10. Anticipated D/C setting: Other 11. Anticipated post D/C treatments: HH therapy and Home excercise program 12. Overall Rehab/Functional Prognosis: good and fair  RECOMMENDATIONS: This patient's condition is appropriate for continued rehabilitative care in the following setting: Will await completion of medical workup and cardiac plan. Patient will also need to demonstrate the ability to tolerate 3 hours therapy/day as she is short of breath at rest and severely dyspneic with movements. Likely SNF.  Patient has agreed to  participate in recommended program. Patient is not really interested in rehabilitation at present. She states that she will reevaluate once her cardiac workup has been completed. Note that insurance prior authorization may be required for reimbursement for recommended care.  Comment: Rehab Admissions Coordinator to follow up.  Maryla Morrow, MD 05/01/2016

## 2016-05-01 NOTE — Progress Notes (Signed)
Inpatient Rehabilitation   Admissions coordinator will follow up with pt. , however per consult, pt. not interested in CIR at present time.    Weldon Picking PT Inpatient Rehab Admissions Coordinator Cell [phone] Office [phone]

## 2016-05-01 NOTE — [place] FL2 (Signed)
[place] MEDICAID FL2 LEVEL OF CARE SCREENING TOOL     IDENTIFICATION  Patient Name: [name] [name] Birthdate: 1941/06/20 Sex: female Admission Date (Current Location): 13-May-2016  [facility] and [place] Number:  [employer] and Address:  [facility], [address] [place], [place] [address]      Provider Number: [id number]  Attending Physician Name and Address:  Nelda Bucks, MD  Relative Name and Phone Number:       Current Level of Care: Hospital Recommended Level of Care: Skilled Nursing Facility Prior Approval Number:    Date Approved/Denied:   PASRR Number:    Discharge Plan: SNF    Current Diagnoses: Patient Active Problem List   Diagnosis Date Noted  . Neck swelling   . Morbid obesity due to excess calories (HCC)   . Shortness of breath   . Dysphagia   . Dyspnea and respiratory abnormality   . Essential hypertension   . Leukocytosis   . Thrombocytopenia (HCC)   . Acute blood loss anemia   . Anxiety disorder   . Acute renal failure superimposed on stage 4 chronic kidney disease (HCC) 04/26/2016  . Acute respiratory failure with hypoxia (HCC)   . Wheezing   . Hematoma of neck 04/13/2016  . Chest pain 05/21/2016  . Chronic kidney disease (CKD), stage IV (severe) (HCC) 05/26/2016  . Depression 05/16/2016  . Chronic diastolic HF (heart failure), NYHA class 2 (HCC) 06/02/2016  . S/P shoulder replacement 04/16/2015  . Fatigue 09/09/2014  . NSTEMI (non-ST elevated myocardial infarction) (HCC) 07/06/2014  . Acute on chronic diastolic CHF (congestive heart failure), NYHA class 3 (HCC) 07/06/2014  . STEMI (ST elevation myocardial infarction) (HCC) 07/05/2014  . Acute renal failure (HCC) 07/05/2014  . Tobacco abuse 07/05/2014  . Hypertensive crisis 07/05/2014  . CHF (congestive heart failure) (HCC) 07/05/2014  . GERD (gastroesophageal reflux disease) 07/05/2014  . Gout 07/05/2014  . S/P IVC filter 07/05/2014  . CVA  (cerebral vascular accident) (HCC) 07/05/2014  . COPD with acute exacerbation (HCC) 03/22/2013  . PE (pulmonary embolism) 03/22/2013  . Renal failure 03/22/2013  . History of DVT (deep vein thrombosis) 03/22/2013    Orientation RESPIRATION BLADDER Height & Weight     Self, Situation, Time, Place  O2 (1L) Continent Weight: 234 lb 3.2 oz (106.232 kg) Height:   (160 cm)  BEHAVIORAL SYMPTOMS/MOOD NEUROLOGICAL BOWEL NUTRITION STATUS      Continent Diet (see DC summary)  AMBULATORY STATUS COMMUNICATION OF NEEDS Skin   Extensive Assist Verbally Normal                       Personal Care Assistance Level of Assistance  Bathing, Dressing Bathing Assistance: Maximum assistance   Dressing Assistance: Maximum assistance     Functional Limitations Info             SPECIAL CARE FACTORS FREQUENCY  PT (By licensed PT), OT (By licensed OT)     PT Frequency: 5/wk OT Frequency: 5/wk            Contractures      Additional Factors Info  Code Status, Allergies, Insulin Sliding Scale Code Status Info: FULL Allergies Info: Ciprofloxacin   Insulin Sliding Scale Info: 4/day       Current Medications (05/01/2016):  This is the current hospital active medication list Current Facility-Administered Medications  Medication Dose Route Frequency Provider Last Rate Last Dose  . acetaminophen (TYLENOL) suppository 650 mg  650 mg Rectal Q6H PRN Delano Metz, MD   650 mg at 04/23/16 2223  . albuterol (PROVENTIL) (2.5 MG/3ML) 0.083% nebulizer solution 2.5 mg  2.5 mg Nebulization Q4H PRN Delano Metz, MD   2.5 mg at 04/27/16 0005  . amLODipine (NORVASC) tablet 10 mg  10 mg Oral Daily Alexa Lucrezia Starch, MD   10 mg at 05/01/16 0914  . atorvastatin (LIPITOR) tablet 40 mg  40 mg Oral q1800 Jeanella Craze, NP   40 mg at 04/30/16 1900  . bisacodyl (DULCOLAX) suppository 10 mg  10 mg Rectal Daily PRN Delano Metz, MD      . cloNIDine (CATAPRES) tablet 0.1 mg  0.1 mg Oral TID Nyoka Cowden, MD   0.1 mg at 05/01/16 0914  . escitalopram (LEXAPRO) tablet 10 mg  10 mg Oral Daily Roslynn Amble, MD   10 mg at 05/01/16 0914  . hydrALAZINE (APRESOLINE) injection 10 mg  10 mg Intravenous Q4H PRN Coralyn Helling, MD   10 mg at 04/29/16 1629  . hydrALAZINE (APRESOLINE) tablet 50 mg  50 mg Oral Q8H Catarina Hartshorn, MD   50 mg at 05/01/16 1318  . HYDROmorphone (DILAUDID) injection 1 mg  1 mg Intravenous Q2H PRN Karl Ito, MD   1 mg at 05/01/16 5520  . insulin aspart (novoLOG) injection 0-20 Units  0-20 Units Subcutaneous TID WC Roslynn Amble, MD   3 Units at 05/01/16 1317  . insulin aspart (novoLOG) injection 0-5 Units  0-5 Units Subcutaneous QHS Roslynn Amble, MD   2 Units at 04/28/16 2151  . ipratropium-albuterol (DUONEB) 0.5-2.5 (3) MG/3ML nebulizer solution 3 mL  3 mL Nebulization Q6H Catarina Hartshorn, MD   3 mL at 05/01/16 1350  . isosorbide mononitrate (IMDUR) 24 hr tablet 120 mg  120 mg Oral Daily Lyn Records, MD   120 mg at 05/01/16 0914  . LORazepam (ATIVAN) injection 0.5 mg  0.5 mg Intravenous Q4H PRN Coralyn Helling, MD   0.5 mg at 04/29/16 0032  . methylPREDNISolone sodium succinate (SOLU-MEDROL) 125 mg/2 mL injection 80 mg  80 mg Intravenous Q12H Nyoka Cowden, MD   80 mg at 05/01/16 0914  . ondansetron (ZOFRAN) injection 4 mg  4 mg Intravenous Q6H PRN Delano Metz, MD   4 mg at 04/30/16 0845  . pantoprazole (PROTONIX) EC tablet 40 mg  40 mg Oral BID AC Nyoka Cowden, MD   40 mg at 05/01/16 0600  . polyethylene glycol (MIRALAX / GLYCOLAX) packet 17 g  17 g Oral Daily PRN Delano Metz, MD      . spironolactone (ALDACTONE) tablet 25 mg  25 mg Oral Daily Jeanella Craze, NP   25 mg at 05/01/16 8022     Discharge Medications: Please see discharge summary for a list of discharge medications.  Relevant Imaging Results:  Relevant Lab Results:   Additional Information SS# [id number]  Izora Ribas, [place]

## 2016-05-01 NOTE — Clinical Social Work Note (Signed)
Clinical Social Work Assessment  Patient Details  Name: [name] [name] MRN: [id number] Date of Birth: 1941-04-21  Date of referral:  05/01/16               Reason for consult:  Facility Placement                Permission sought to share information with:  Oceanographer granted to share information::  Yes, Verbal Permission Granted  Name::     Sport and exercise psychologist::  SNFs  Relationship::  dtr  Contact Information:     Housing/Transportation Living arrangements for the past 2 months:  Single Family Home Source of Information:  Patient Patient Interpreter Needed:  None Criminal Activity/Legal Involvement Pertinent to Current Situation/Hospitalization:  No - Comment as needed Significant Relationships:  Adult Children Lives with:  Self Do you feel safe going back to the place where you live?  No (not at current level of impairment) Need for family participation in patient care:  No (Coment)  Care giving concerns:  Pt lives at home alone- reports that she has some family able to assist at home if needed.   Social Worker assessment / plan:  CSW spoke with pt in regards to PT recommendation for rehab placement.  Explained SNF and SNF referral process- pt reports never having been to SNF but states she did participate in outpatient program in the past.  Employment status:  Retired Community education officer information:  Medicare PT Recommendations:  Skilled Nursing Facility Information / Referral to community resources:  Skilled Nursing Facility  Patient/Family's Response to care:  Pt hesitant at first when discussing SNF.  Pt agreeable to CSW looking into possibilities but patient not sure she would go- thinks she would have enough family support if she doesn't decline further during hospital stay.  Patient/Family's Understanding of and Emotional Response to Diagnosis, Current Treatment, and Prognosis:  Pt does not have clear understanding of her diagnosis/prognosis at this time.   Very anxious about her heart and what is wrong with it- believes that this will influence her disposition  Emotional Assessment Appearance:  Appears stated age Attitude/Demeanor/Rapport:  Sedated Affect (typically observed):  Appropriate, Pleasant Orientation:  Oriented to Situation, Oriented to  Time, Oriented to Place, Oriented to Self Alcohol / Substance use:  Not Applicable Psych involvement (Current and /or in the community):  No (Comment)  Discharge Needs  Concerns to be addressed:  Care Coordination, Home Safety Concerns Readmission within the last 30 days:  No Current discharge risk:  Physical Impairment Barriers to Discharge:  Continued Medical Work up   [employer], LCSW 05/01/2016, 2:13 PM

## 2016-05-01 NOTE — Progress Notes (Signed)
eLink Physician-Brief Progress Note Patient Name: [name] [name] DOB: [birth year] MRN: [id number]   Date of Service  05/01/2016  HPI/Events of Note  Asked to speak with the daughter who had questions about her mother's care. 1) Will she get a cardiac cath before discharge? 2) Will we remove the CVC in IJ?  eICU Interventions  1) Told her that we will call cardiology in AM to determine the timing of cath 2) Asked nurse to ensure good peripheral access before removal of CVC.     Intervention Category Evaluation Type: Other  Rector Devonshire 05/01/2016, 6:46 PM

## 2016-05-01 NOTE — Progress Notes (Signed)
Patient is resting comfortably on 1L [place] with O2 Sat 95%. BIPAP is not needed at this time.

## 2016-05-01 NOTE — Progress Notes (Signed)
Patient's daughter Nettie Elm is in visiting and has several questions. PCCM oncall MD spoke to [place] and answered her questions.

## 2016-05-01 NOTE — Progress Notes (Signed)
eLink Physician-Brief Progress Note Patient Name: Chenea Kipper DOB: 1941-01-08 MRN: [id number]   Date of Service  05/01/2016  HPI/Events of Note  C/O chest pain and jaw pain. EKG with ST depressions.   eICU Interventions  Check Troponins.      Intervention Category Intermediate Interventions: Other:  Jeren Dufrane 05/01/2016, 10:20 PM

## 2016-05-02 ENCOUNTER — Inpatient Hospital Stay (HOSPITAL_COMMUNITY): Payer: Medicare Other

## 2016-05-02 DIAGNOSIS — I2699 Other pulmonary embolism without acute cor pulmonale: Secondary | ICD-10-CM

## 2016-05-02 DIAGNOSIS — I209 Angina pectoris, unspecified: Secondary | ICD-10-CM

## 2016-05-02 LAB — BLOOD GAS, ARTERIAL
ACID-BASE DEFICIT: 0 mmol/L (ref 0.0–2.0)
Bicarbonate: 25.8 mEq/L — ABNORMAL HIGH (ref 20.0–24.0)
DRAWN BY: 244861
O2 CONTENT: 5 L/min
O2 SAT: 94.9 %
PATIENT TEMPERATURE: 98.6
TCO2: 27.6 mmol/L (ref 0–100)
pCO2 arterial: 56.1 mmHg — ABNORMAL HIGH (ref 35.0–45.0)
pH, Arterial: 7.285 — ABNORMAL LOW (ref 7.350–7.450)
pO2, Arterial: 83.3 mmHg (ref 80.0–100.0)

## 2016-05-02 LAB — TROPONIN I
TROPONIN I: 0.26 ng/mL — AB (ref <0.031)
TROPONIN I: 0.36 ng/mL — AB (ref <0.031)

## 2016-05-02 LAB — GLUCOSE, CAPILLARY
GLUCOSE-CAPILLARY: 187 mg/dL — AB (ref 65–99)
GLUCOSE-CAPILLARY: 94 mg/dL (ref 65–99)
Glucose-Capillary: 168 mg/dL — ABNORMAL HIGH (ref 65–99)
Glucose-Capillary: 144 mg/dL — ABNORMAL HIGH (ref 65–99)

## 2016-05-02 LAB — CBC
HEMATOCRIT: 31.5 % — AB (ref 36.0–46.0)
HEMOGLOBIN: 10.3 g/dL — AB (ref 12.0–15.0)
MCH: 33.1 pg (ref 26.0–34.0)
MCHC: 32.7 g/dL (ref 30.0–36.0)
MCV: 101.3 fL — AB (ref 78.0–100.0)
Platelets: 107 10*3/uL — ABNORMAL LOW (ref 150–400)
RBC: 3.11 MIL/uL — AB (ref 3.87–5.11)
RDW: 14.2 % (ref 11.5–15.5)
WBC: 23.5 10*3/uL — AB (ref 4.0–10.5)

## 2016-05-02 LAB — POCT I-STAT 3, ART BLOOD GAS (G3+)
ACID-BASE EXCESS: 3 mmol/L — AB (ref 0.0–2.0)
Bicarbonate: 28.2 mEq/L — ABNORMAL HIGH (ref 20.0–24.0)
O2 SAT: 94 %
Patient temperature: 98.6
TCO2: 30 mmol/L (ref 0–100)
pCO2 arterial: 46 mmHg — ABNORMAL HIGH (ref 35.0–45.0)
pH, Arterial: 7.395 (ref 7.350–7.450)
pO2, Arterial: 74 mmHg — ABNORMAL LOW (ref 80.0–100.0)

## 2016-05-02 LAB — RENAL FUNCTION PANEL
ALBUMIN: 3.6 g/dL (ref 3.5–5.0)
ANION GAP: 10 (ref 5–15)
BUN: 52 mg/dL — AB (ref 6–20)
CHLORIDE: 100 mmol/L — AB (ref 101–111)
CO2: 26 mmol/L (ref 22–32)
Calcium: 8.7 mg/dL — ABNORMAL LOW (ref 8.9–10.3)
Creatinine, Ser: 1.97 mg/dL — ABNORMAL HIGH (ref 0.44–1.00)
GFR, EST AFRICAN AMERICAN: 28 mL/min — AB (ref >60)
GFR, EST NON AFRICAN AMERICAN: 24 mL/min — AB (ref >60)
Glucose, Bld: 177 mg/dL — ABNORMAL HIGH (ref 65–99)
PHOSPHORUS: 4.7 mg/dL — AB (ref 2.5–4.6)
POTASSIUM: 5.4 mmol/L — AB (ref 3.5–5.1)
Sodium: 136 mmol/L (ref 135–145)

## 2016-05-02 LAB — MAGNESIUM: Magnesium: 2.8 mg/dL — ABNORMAL HIGH (ref 1.7–2.4)

## 2016-05-02 MED ORDER — FUROSEMIDE 10 MG/ML IJ SOLN
60.0000 mg | Freq: Once | INTRAMUSCULAR | Status: AC
Start: 2016-05-02 — End: 2016-05-02
  Administered 2016-05-02: 60 mg via INTRAVENOUS
  Filled 2016-05-02: qty 6

## 2016-05-02 MED ORDER — NALOXONE HCL 0.4 MG/ML IJ SOLN: 0.4000 mg | INTRAMUSCULAR | Status: DC | PRN

## 2016-05-02 MED ORDER — DEXTROSE 5 % IV SOLN
2.0000 g | INTRAVENOUS | Status: DC
  Administered 2016-05-02 – 2016-05-06 (×5): 2 g via INTRAVENOUS
  Filled 2016-05-02 (×7): qty 2

## 2016-05-02 MED ORDER — PANTOPRAZOLE SODIUM 40 MG IV SOLR
40.0000 mg | Freq: Two times a day (BID) | INTRAVENOUS | Status: DC
  Administered 2016-05-02 – 2016-05-03 (×2): 40 mg via INTRAVENOUS
  Filled 2016-05-02 (×2): qty 40

## 2016-05-02 NOTE — Progress Notes (Signed)
Patient Name: [name] [name] Date of Encounter: 05/02/2016  Primary Cardiologist: Dr. Delton See   Patient profile:  75 yo female with PMH of COPD, CKD, DVT/PE with previous IVC filter, CVA, diastolic HF, h/o angina but no cath presented to [facility] on 5/16 with acute on chronic SOB and hypertensive urgency with SBP 240. Upon arrival, she was placed on nitro gtt. Trop > 7. Cr 2. Hospital course complicated by expanding hematoma of her neck at R IJ on 5/21.  Principal Problem:   Chest pain Active Problems:   COPD with acute exacerbation (HCC)   Hypertensive crisis   S/P IVC filter   NSTEMI (non-ST elevated myocardial infarction) (HCC)   Chronic kidney disease (CKD), stage IV (severe) (HCC)   Depression   Chronic diastolic HF (heart failure), NYHA class 2 (HCC)   Hematoma of neck   Acute renal failure superimposed on stage 4 chronic kidney disease (HCC)   Acute respiratory failure with hypoxia (HCC)   Wheezing   Anxiety disorder   Neck swelling   Morbid obesity due to excess calories (HCC)   Shortness of breath   Dysphagia   Dyspnea and respiratory abnormality   Essential hypertension   Leukocytosis   Thrombocytopenia (HCC)   Acute blood loss anemia    SUBJECTIVE  Went into the room, she is taking quick deep breath with prolonged expiratory phase. Screaming that she can't breathe and she says she is going to die. Nurse just gave morphine, gave another dilaudid and Xanax before calming down.   CURRENT MEDS . amLODipine  10 mg Oral Daily  . atorvastatin  40 mg Oral q1800  . cloNIDine  0.1 mg Oral TID  . escitalopram  10 mg Oral Daily  . hydrALAZINE  50 mg Oral Q8H  . insulin aspart  0-20 Units Subcutaneous TID WC  . insulin aspart  0-5 Units Subcutaneous QHS  . ipratropium-albuterol  3 mL Nebulization Q6H  . isosorbide mononitrate  120 mg Oral Daily  . methylPREDNISolone (SOLU-MEDROL) injection  80 mg Intravenous Q12H  . pantoprazole  40 mg Oral BID AC  .  spironolactone  25 mg Oral Daily    OBJECTIVE  Filed Vitals:   05/02/16 0600 05/02/16 0751 05/02/16 0754 05/02/16 0802  BP: 139/64   128/76  Pulse:    108  Temp:    97.9 F (36.6 C)  TempSrc:    Oral  Resp:    13  Height:      Weight:      SpO2:  100% 97% 95%    Intake/Output Summary (Last 24 hours) at 05/02/16 1246 Last data filed at 05/02/16 1036  Gross per 24 hour  Intake    720 ml  Output    300 ml  Net    420 ml   Filed Weights   04/30/16 0500 05/01/16 0351 05/02/16 0447  Weight: 231 lb 12.8 oz (105.144 kg) 234 lb 3.2 oz (106.232 kg) 232 lb 11.2 oz (105.552 kg)    PHYSICAL EXAM  General: Pt awake  Answers some questions   Neuro: Alert and oriented X 3. Moves all extremities spontaneously. Psych: Normal affect. HEENT:  Normal  Neck: Neck full   +significant bruising under the chin. Lungs:  Pseudowheezing . Bilateral prolonged expiratory phase, upper airway wheezing.  Heart: RRR no s3, s4, or murmurs. Abdomen: Soft, non-tender, non-distended, BS + x 4.  Extremities: No clubbing, cyanosis   Tr edema. DP/PT/Radials 2+ and equal bilaterally.  Accessory Clinical Findings  CBC  Recent Labs  05/01/16 0353 05/02/16 0435  WBC 14.9* 23.5*  HGB 9.1* 10.3*  HCT 28.1* 31.5*  MCV 100.7* 101.3*  PLT 85* 107*   Basic Metabolic Panel  Recent Labs  05/01/16 0353 05/01/16 0358 05/02/16 0435  NA  --  139 136  K  --  5.1 5.4*  CL  --  104 100*  CO2  --  28 26  GLUCOSE  --  136* 177*  BUN  --  44* 52*  CREATININE  --  1.71* 1.97*  CALCIUM  --  8.6* 8.7*  MG 2.9*  --  2.8*  PHOS  --  4.3 4.7*   Liver Function Tests  Recent Labs  05/01/16 0358 05/02/16 0435  ALBUMIN 3.3* 3.6   Cardiac Enzymes  Recent Labs  05/01/16 2245 05/02/16 0435 05/02/16 1000  TROPONINI 0.23* 0.26* 0.36*    TELE Sinus tach with ST depression     ECG  Sinus tach with ST depression in inferolateral leads  Echocardiogram 04/09/2016  LV EF: 65% -  70%  ------------------------------------------------------------------- Indications: Chest pain 786.51.  ------------------------------------------------------------------- History: PMH: Chronic obstructive pulmonary disease. Risk factors: Chronic kidney disease. NSTEMI. Status post IVC filter. Hypertension.  ------------------------------------------------------------------- Study Conclusions  - Left ventricle: The cavity size was normal. Wall thickness was  increased in a pattern of moderate LVH. Systolic function was  vigorous. The estimated ejection fraction was in the range of 65%  to 70%. Wall motion was normal; there were no regional wall  motion abnormalities. Doppler parameters are consistent with  abnormal left ventricular relaxation (grade 1 diastolic  dysfunction). - Mitral valve: Calcified annulus. There was mild regurgitation. - Left atrium: The atrium was mildly dilated.  Impressions:  - Normal LV systolic function; moderate LVH; grade 1 diastolic  dysfunction; mild LAE.; mild MR.     Radiology/Studies  Dg Neck Soft Tissue  04/26/2016  CLINICAL DATA:  75 year old female with stridor, wheezing. Initial encounter. EXAM: NECK SOFT TISSUES - 1+ VIEW COMPARISON:  04/23/2016 neck x-rays and earlier. FINDINGS: Right IJ approach venous catheter remains in place, tip not included. On the lateral view of the neck there is abnormal submental/ anterior neck subcutaneous stranding (arrow). The leftward tracheal deviation seen previously has regressed. The abnormal prevertebral soft tissue contour demonstrated on 04/23/2016 has regressed but not completely normalized (13 mm at the C2 level versus normal of 25mm or less). Mass effect on the pharynx has regressed but not resolved. No subcutaneous gas identified. No pneumothorax in the visible lung apices. IMPRESSION: Regressed but not resolved abnormal density and stranding in the neck which probably reflects  resolving trans-spatial hematoma in this setting. Neck CT (IV contrast preferred) may confirm as needed. Electronically Signed   By: Odessa Fleming M.D.   On: 04/26/2016 13:18   Dg Neck Soft Tissue  04/23/2016  CLINICAL DATA:  Right neck pain/ swelling EXAM: NECK SOFT TISSUES - 1+ VIEW COMPARISON:  None. FINDINGS: Prevertebral soft tissues anterior to the upper cervical spine are markedly enlarged on the lateral view, indicating prevertebral edema. On the frontal radiograph, the trachea is deviated to the left. IMPRESSION: Suspected prevertebral edema with leftward tracheal deviation. CT neck with contrast is suggested for further characterization. Electronically Signed   By: Charline Bills M.D.   On: 04/23/2016 09:25   Dg Chest 1 View  04/29/2016  CLINICAL DATA:  Central line placement EXAM: CHEST 1 VIEW COMPARISON:  04/18/2016 chest radiograph. FINDINGS: Right internal jugular central venous catheter terminates in  the middle third of the superior vena cava. Stable cardiomediastinal silhouette with top-normal heart size. No pneumothorax. No right pleural effusion. No overt pulmonary edema. Small left pleural effusion, probably stable. Mild left basilar atelectasis. IMPRESSION: 1. Right internal jugular central venous catheter terminates in the middle third of the superior vena cava. No pneumothorax. 2. Stable small left pleural effusion with mild left basilar atelectasis. Electronically Signed   By: Delbert Phenix M.D.   On: 05-08-2016 15:48   Ct Soft Tissue Neck Wo Contrast  04/26/2016  CLINICAL DATA:  Hematoma right neck. 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 COMPARISON:  Neck radiographs 04/26/2016 FINDINGS: Pharynx and larynx: Tongue is normal. Tonsils are normal. Nasopharynx normal. There is tortuosity of the right internal carotid indenting the  posterior wall of the oropharynx. Some of this may account for prevertebral soft tissue thickening noted on x-ray. Prevertebral soft tissues measure up 12 mm in thickness on the CT but no mass or hematoma identified. Marked improvement in prevertebral soft tissue swelling since the x-ray of 04/23/2016. This may be due to resolving hematoma. Airway intact. Larynx normal. Salivary glands: Prominent parotid glands bilaterally which are fatty replaced. Submandibular glands normal bilaterally. Thyroid: Negative Lymph nodes: Negative for adenopathy in the neck. Vascular: Atherosclerotic calcification in the carotid bifurcation bilaterally. Right internal carotid artery is tortuous with retropharyngeal course indenting the posterior oropharynx. Right jugular central venous catheter in good position. No hematoma at the catheter site. Limited intracranial: Negative Visualized orbits: Not imaged Mastoids and visualized paranasal sinuses: Small air-fluid level in the sphenoid sinus. Remaining paranasal sinuses clear. Mastoid sinus clear bilaterally. Skeleton: Mild disc degeneration and spurring in the thoracic spine. No fracture or bone lesion. Advanced degenerative change in the left TMJ and moderate degenerative change in the right TMJ. Upper chest: Mild stranding in the right upper chest subcutaneous fat. There is also similar stranding in the subcutaneous fat of the chin bilaterally left greater than right. These areas may be due to bruising or cellulitis. No frank hematoma identified. Lung apices clear. Prominent extrapleural fat in the lung apices posteriorly bilaterally. IMPRESSION: The airway is  intact.  No hematoma is identified. Prevertebral soft tissue swelling due to large patient size and medial deviation of the right internal carotid artery causing indentation of the posterior oropharynx. Currently no hematoma. Marked prevertebral soft tissue swelling on 04/23/2016 May have been edema or hematoma which has  resolved. Mild stranding in the right anterior chest and bilateral chin may be due to cellulitis or bruising. Electronically Signed   By: Marlan Palau M.D.   On: 04/26/2016 16:08   Dg Chest Port 1 View  04/26/2016  CLINICAL DATA:  75 year old female with stridor and wheezing. Initial encounter. EXAM: PORTABLE CHEST 1 VIEW COMPARISON:  0828 hours today and earlier. FINDINGS: Portable AP semi upright view at 1243 hours. Stable right IJ central line, tip at the level of the aortic arch, cephalad SVC. Stable cardiac size and mediastinal contours. No pneumothorax or pulmonary edema. No consolidation. Left lung base ventilation is stable. No new pulmonary opacity. IMPRESSION: Stable ventilation since 0828 hours today. No new cardiopulmonary abnormality. Electronically Signed   By: Odessa Fleming M.D.   On: 04/26/2016 13:20   Dg Chest Port 1 View  04/26/2016  CLINICAL DATA:  Acute respiratory failure with hypoxia EXAM: PORTABLE CHEST - 1 VIEW COMPARISON:  04/08/2016 FINDINGS: Stable right IJ  central venous catheter. Lungs are clear. Improved aeration of the left lung base. Heart size and mediastinal contours are within normal limits. No effusion evident. Right shoulder arthroplasty hardware partially visualized. IMPRESSION: Improved aeration at the left lung base. Electronically Signed   By: Corlis Leak M.D.   On: 04/26/2016 08:45   Dg Chest Port 1 View  04/19/2016  CLINICAL DATA:  Respiratory failure. EXAM: PORTABLE CHEST 1 VIEW COMPARISON:  04/15/2016. FINDINGS: Right IJ line in stable position. Mild left base atelectasis and/or infiltrate. Small left pleural effusion. No pneumothorax. Heart size normal. Right shoulder replacement. IMPRESSION: 1.  Right IJ line in stable position. 2. Mild left base atelectasis and/or infiltrate. Small left pleural effusion. No interim change. Electronically Signed   By: Maisie Fus  Register   On: 04/07/2016 07:13    ASSESSMENT AND PLAN  1. NSTEMI  - Patient was started on IV  nitroglycerin and IV heparin. On 5/21 patient developed a large hematoma in her neck and IV heparin and ASA was discontinued.  Earlier this admission- + trop > 7 and lateral ST depression with ongoing angina, cardiology consulted.  REcomm L heart cath when medically stabiliaed   - Echo 04/14/2016 EF 65-70%, no RWMA, mild MR.   2. Acute on chronic diastolic HF  - acute resp distress in the room, ABG showed pH 7.285, CXR shows LLL opacity. Discussed with Dr. Tenny Craw, will give 1 dose IV lasix . Dr. Tyson Alias notified, currently seeing the patient.  3. Recent acute IJ bleed with neck hematoma  4. COPD exacerbation  CCM seeing pt.    5. Acute on CKD stage IV  Cr has improved from earlier this admission    7. HTN  Signed, Azalee Course PA-C Pager: [phone]  Pt seen and examined  I have amended note above by Harrell Lark to reflect my findings. Pt woke up with signif chest tightness, jaw discomfort  Rx MSO4 and then ativan and dilaudid.  May be breathing more comfortably . On exam BP and heart rate elevated  Sat 95%  Morbidly obese 75 yo  Neck with hematoma  Lung exam:  pseudowheezing  Cardiac exam:  RRR  NO S3  Ext with trace edema EKG with inferolateral ST depression   THis ws present in the past   CXR with ? LLL infiltrate  ABG with pH 7.28  I would recomm lasix x1  Follow HR/BP  If remains agitated a little MS04 may help.   Pt will need L heart cath when medically stabilized.    Will continue to follow.

## 2016-05-02 NOTE — Progress Notes (Signed)
sw had spoken w pt and da sylvia [phone]. They do not want snf and want to go back to [place] [facility]. Gave pt and da list of hhc agencies in [place]. They are going to talk about them and let us know which agency they want. Will cont to follow. Has rw and bsc per da.

## 2016-05-02 NOTE — Progress Notes (Signed)
Received patient from [facility], reprt given by Kirt Boys.  BP 147/75 mmHg  Pulse 106  Temp(Src) 97.1 F (36.2 C) (Axillary)  Resp 12  Ht 5\' 3"  (1.6 m)  Wt 105.552 kg (232 lb 11.2 oz)  BMI 41.23 kg/m2  SpO2 95% Noted large hematoma to neck, upper chest, breasts and arms.  Patient able to respond appropriately and follow all commands.  Sats currently on Bipap 95-97%.  Please see computer for full assessment.  Glasses transferred with patient and left at bedside.  [place] to notify family of patient transfer. Will continue to monitor closely.

## 2016-05-02 NOTE — Progress Notes (Signed)
Rt placed pt on  BIPAP/V60 due to  ABG.

## 2016-05-02 NOTE — Progress Notes (Signed)
RT NOTE:  RN called and stated the patient was wanting to go back on BIPAP. Upon arrival pt was SOB, WOB increased. Pt stated she was having a hard time. BIPAP started back. RT will monitor.

## 2016-05-02 NOTE — Progress Notes (Signed)
Patient screaming help help.[name] [name] in the room and noticed o2 saturation of 86% on 1L. Pt verbalized she cannot breath. Oxygen increased from 1L to 5L. O2 saturations now 5L. Primary RN notified.

## 2016-05-02 NOTE — Progress Notes (Signed)
CSW received call from pt dtr- they would like to take patient home and get home health services  CSW informed RNCM- CSW signing off  Merlyn Lot, [facility] Clinical Social Worker [phone]

## 2016-05-02 NOTE — Progress Notes (Signed)
RT NOTE: Pt has been on BIPAP since 1630, Dr. Tyson Alias spoke with the patients RN and stated the patient needs to be on 4 hour and off 4 hours. RT will remove. Pt is aware to call out if she feels her breathing is getting worse. RT will monitor.

## 2016-05-02 NOTE — Progress Notes (Signed)
   05/02/16 1300  Clinical Encounter Type  Visited With Patient;Health care provider  Visit Type Initial  Referral From Nurse  Stress Factors  Patient Stress Factors Health changes  Pt was calling for help and staff reported to her room.  Chaplain asked pt if she needed some support but pt was unresponsive to question.  Informed nurse that chaplain would return later in the afternoon to check on pt.

## 2016-05-02 NOTE — Progress Notes (Signed)
Inpatient Rehabilitation  Pt. With difficulty breathing this afternoon.  I elected to delay my visit with pt. to address IP Rehab in the future.  Pt. For heart cath when medically stable.  Will follow along for progress and will speak with pt. When appropriate.    Weldon Picking PT Inpatient Rehab Admissions Coordinator Cell [phone] Office [phone]

## 2016-05-02 NOTE — Progress Notes (Signed)
Offered Pt a bath, Pt refused stated she was in pain. Tech Occupational hygienist.

## 2016-05-02 NOTE — Progress Notes (Signed)
Pharmacy Antibiotic Note  [name] [name] is a 75 y.o. female admitted on 04/10/2016 with chest pain. CCM now worried about HCAP. Pharmacy has been consulted for ceftazidime dosing.  Day #1 of abx for HCAP. Afebrile, WBC elevated at 23.5. SCr 1.97, CrCl ~73ml/min.   Plan: Start ceftaizidime 2g IV Q24 Monitor clinical picture, renal function F/U C&S, abx deescalation / LOT   Height: 5\' 3"  (160 cm) Weight: 232 lb 11.2 oz (105.552 kg) IBW/kg (Calculated) : 52.4  Temp (24hrs), Avg:98 F (36.7 C), Min:97.7 F (36.5 C), Max:98.6 F (37 C)   Recent Labs Lab 04/28/16 0430 04/29/16 0525 04/30/16 0542 05/01/16 0353 05/01/16 0358 05/02/16 0435  WBC 15.4* 15.7* 13.8* 14.9*  --  23.5*  CREATININE 1.70* 1.69* 1.64*  --  1.71* 1.97*    Estimated Creatinine Clearance: 29.6 mL/min (by C-G formula based on Cr of 1.97).    Allergies  Allergen Reactions  . Ciprofloxacin     rash    Antimicrobials this admission: Ceftazidime 5/30 >>   Dose adjustments this admission: n/a  Microbiology results: MRSA PCR: negative  Thank you for allowing pharmacy to be a part of this patient's care.  Enzo Bi, PharmD, BCPS Clinical Pharmacist Pager [phone] 05/02/2016 2:06 PM

## 2016-05-02 NOTE — Care Management Important Message (Signed)
Important Message  Patient Details  Name: [name] [name] MRN: [id number] Date of Birth: Apr 16, 1941   Medicare Important Message Given:  Yes    Bernadette Hoit 05/02/2016, 10:37 AM

## 2016-05-02 NOTE — Progress Notes (Signed)
PT Cancellation Note  Patient Details Name: [name] [name] MRN: [id number] DOB: 1941/07/03   Cancelled Treatment:    Reason Eval/Treat Not Completed: Medical issues which prohibited therapy.  Troponin trending up this am and pt c/o chest pain.  PT will continue to follow.  Encarnacion Chu PT, DPT  Pager: [phone] Phone: [phone] 05/02/2016, 12:53 PM

## 2016-05-02 NOTE — Progress Notes (Addendum)
PULMONARY / CRITICAL CARE MEDICINE   Name: [name] [name] MRN: [id number] DOB: 10-Mar-1941    ADMISSION DATE:  04/13/2016 CONSULTATION DATE:  04/23/2016  REFERRING MD:  Viann Fish, Teodora Medici  CHIEF COMPLAINT:  Can't breath  Brief Profile   75 yo obese wf quit smoking one year pta  admitted with NSTEMI, HTN urgency, and AECOPD. She has hx of CKD and difficulty IV access. She had Rt IJ CVL placed 5/19 >> developed Rt neck hematoma with airway compromise and transferred to ICU. She has been transferred to SDU as her respiratory status has improved.   SUBJECTIVE:  Admits she gets tense and anxious with tight soreness bilateral mandible up to forehead, unless she gets anxiolytic or pain pill before meals. Knows to ask for this.She wants to get OOB today.   VITAL SIGNS: BP 128/76 mmHg  Pulse 108  Temp(Src) 97.9 F (36.6 C) (Oral)  Resp 13  Ht 5\' 3"  (1.6 m)  Wt 232 lb 11.2 oz (105.552 kg)  BMI 41.23 kg/m2  SpO2 95%  2lpm NP  HEMODYNAMICS:    VENTILATOR SETTINGS:    INTAKE / OUTPUT: I/O last 3 completed shifts: In: 480 [P.O.:480] Out: 700 [Urine:700]  PHYSICAL EXAMINATION: General:  Elderly female. Obese.  Neuro:  CN in tact. A&O x4. Following commands. HEENT:  No stridor. Bruising over neck unchanged. Moist mucus membranes. Cardiovascular: Regular rate. Unable to appreciate JVD. No edema. Lungs: Effort related expiratory wheeze noted.   Abdomen:  Soft. Protuberant. Nontender.  Integument:  Warm & dry. No rash on exposed skin. Multiple bruises on arms  LABS:  BMET  Recent Labs Lab 04/30/16 0542 05/01/16 0358 05/02/16 0435  NA 137 139 136  K 4.8 5.1 5.4*  CL 101 104 100*  CO2 28 28 26   BUN 42* 44* 52*  CREATININE 1.64* 1.71* 1.97*  GLUCOSE 164* 136* 177*    Electrolytes  Recent Labs Lab 04/30/16 0542 05/01/16 0353 05/01/16 0358 05/02/16 0435  CALCIUM 9.0  --  8.6* 8.7*  MG 2.7* 2.9*  --  2.8*  PHOS 3.7  --  4.3 4.7*    CBC  Recent Labs Lab  04/30/16 0542 05/01/16 0353 05/02/16 0435  WBC 13.8* 14.9* 23.5*  HGB 9.6* 9.1* 10.3*  HCT 29.7* 28.1* 31.5*  PLT 95* 85* 107*    Coag's No results for input(s): APTT, INR in the last 168 hours.  Sepsis Markers No results for input(s): LATICACIDVEN, PROCALCITON, O2SATVEN in the last 168 hours.  ABG  Recent Labs Lab 04/26/16 1253  PHART 7.506*  PCO2ART 36.5  PO2ART 65.0*    Liver Enzymes  Recent Labs Lab 04/30/16 0542 05/01/16 0358 05/02/16 0435  ALBUMIN 3.4* 3.3* 3.6    Cardiac Enzymes  Recent Labs Lab 05/01/16 2245 05/02/16 0435  TROPONINI 0.23* 0.26*    Glucose  Recent Labs Lab 04/30/16 2136 05/01/16 0758 05/01/16 1201 05/01/16 1633 05/01/16 2101 05/02/16 0737  GLUCAP 149* 140* 135* 143* 288* 168*    Imaging No results found.   STUDIES:  5/19 Echo >> mod LVH, EF 65 to 70%, grade 1 diastolic CHF 5/22 Port CXR:  R IJ in good position. No new opacity. 5/24 Port CXR:  No new focal opacity. IJ in good position. 5/24 X-ray Neck:  Regressed density & stranding in neck. 5/24 CT neck w/o: Airway in tact. No hematoma. Indentation of posterior pharynx by anatomy. Mild stranding right anterior chest & bilateral chin could be due to cellulitis or bruising.  MICROBIOLOGY: MRSA PCR  5/19:  Negative   ANTIBIOTICS: None  SIGNIFICANT EVENTS: 5/19 Admit, cardiology consulted 5/21 Transfer to ICU with respiratory distress from Rt neck hematoma 5/22 Transferred back to SDU, tolerating  5/24- worseing status resp failure, back to ICU 5/26- Improving status transferred to SDU  LINES/TUBES: 5/19 Rt IJ CVL >> PIV x2  ASSESSMENT / PLAN:  PULMONARY A: Acute Exacerbation of COPD R/o NEW ASPIRATION EVENT 5/24 Presumed sleep disordered breathing Saturations 95%, RR =15  5/30 VCD? P:   Titrate FiO2 to keep SpO2 > 92% Breo d/c'ed 5/27 due to hoarseness/ upper airway cough  Duoneb q6hr Continue Solu-Medrol 40mg  IV q24hr CXR 5/31  am  CARDIOVASCULAR A:  NSTEMI - Cath currently on hold. Hypertensive Urgency - Resolved. Acute on Chronic Diastolic CHF H/O HTN  P:  Cardiology Following Continuing to hold ASA & Heparin gtt w/ hematoma Norvasc 10mg  QD, aldactone 25 mg QD, hydralazine 50mg  q8hr, imdur 120mg  QD, lipitor 40mg  QHS Added clonidine 5/27 > improved 5/28 Continued jaw pain Rechecking Troponins: 0.23  5/29:  0.26 5/30 Check with Cards re: cath prior to DC   RENAL A:   Acute on Chronic Renal Failure Stage IV - Creatinine stable. Potassium 5.4  ( 5/30)  P:    Monitoring electrolytes & renal function daily Replete as needed Ensure no potassium   GASTROINTESTINAL A:   H/O GERD Questionable Aspiration  P:   D2 per ST protonix bid ac     HEMATOLOGIC A:   Neck Hematoma - Resolving. Anemia - Mild. No obvious ongoing bleeding, HGB 10.3 ( 5/30) Thrombocytopenia -  Continues to improve, but remains low ( 107)  P:  Trending cell counts daily w/ CBC SCDs  INFECTIOUS A:   WBC up trending  Afebrile  P:   Monitor for fever/ WBC Consider re-culturing with fever of 101.5 Urine culture today D/c foley CXR 5/30 am  ENDOCRINE A:   Hyperglycemia - BG controlled.   P:   Accu-Checks q4hr SSI per resistant algorithm  NEUROLOGIC A:   H/O CVA H/O Anxiety H/O Depression  P:   Monitor clinically Ativan IV prn Dilaudid IV prn Tylenol prn Continue Lexapro at 10mg  daily (home dose 20mg ) PT consult  TODAY'S SUMMARY:. Patient continuing to tolerate diet. Anxiety seems to be significant contributor factor to her dyspnea. Continuing noninvasive positive pressure ventilation for increased work of breathing intermittently and also sleeping. Continuing current dose of Lexapro and when necessary IV Ativan for treatment of anxiety. Hypertension better addressed by adding clonidine at this point which will help with pain and anxiety as well. Anemia and thrombocytopenia are not yet resolving. WBC up  to 23 today but afebrile, will culture urine and d/c foley.Will order BC x 2 for temp of 101.5. Check CXR in am.Troponin's at recheck this am are 0.23, 0.26. Need to check with cards regarding plans for cath prior to D/C.  Still with prominent pseudowheeze on exam so avoid DPI's in this setting   Bevelyn Ngo, AGACNP-BC [facility] Pulmonary/Critical Care Medicine Pager # [phone]  STAFF NOTE: I, Rory Percy, MD FACP have personally reviewed patient's available data, including medical history, events of note, physical examination and test results as part of my evaluation. I have discussed with resident/NP and other care providers such as pharmacist, RN and RRT. In addition, I personally evaluated patient and elicited key findings of: distress mild, less responsive, ronchi, no overt wheezing, lethargic after sedation given, ABG stat noted, PCXR reviewed, concern some LLL haziness, hcap?, cards at  bedside, lasix provided, move to ICU , consider NIMV, WBC noted some hemococnetration last 24 hrs for sure, all cell lineages increased, would cover HCAP and follow PCXR, also needs follow up abg, overall neck anatomy, prior bleed MUCH improved , no upper airway obstruction, narcan  Ccm time 30 min    Mcarthur Rossetti. Tyson Alias, MD, FACP Pgr: [phone] [place] [facility] 05/02/2016 1:50 PM

## 2016-05-03 ENCOUNTER — Inpatient Hospital Stay (HOSPITAL_COMMUNITY): Payer: Medicare Other

## 2016-05-03 DIAGNOSIS — J189 Pneumonia, unspecified organism: Secondary | ICD-10-CM | POA: Insufficient documentation

## 2016-05-03 LAB — GLUCOSE, CAPILLARY
GLUCOSE-CAPILLARY: 116 mg/dL — AB (ref 65–99)
Glucose-Capillary: 125 mg/dL — ABNORMAL HIGH (ref 65–99)
Glucose-Capillary: 127 mg/dL — ABNORMAL HIGH (ref 65–99)
Glucose-Capillary: 144 mg/dL — ABNORMAL HIGH (ref 65–99)
Glucose-Capillary: 159 mg/dL — ABNORMAL HIGH (ref 65–99)

## 2016-05-03 LAB — RENAL FUNCTION PANEL
ANION GAP: 10 (ref 5–15)
Albumin: 3.5 g/dL (ref 3.5–5.0)
BUN: 53 mg/dL — ABNORMAL HIGH (ref 6–20)
CHLORIDE: 100 mmol/L — AB (ref 101–111)
CO2: 29 mmol/L (ref 22–32)
Calcium: 8.7 mg/dL — ABNORMAL LOW (ref 8.9–10.3)
Creatinine, Ser: 2.01 mg/dL — ABNORMAL HIGH (ref 0.44–1.00)
GFR calc non Af Amer: 23 mL/min — ABNORMAL LOW (ref >60)
GFR, EST AFRICAN AMERICAN: 27 mL/min — AB (ref >60)
GLUCOSE: 113 mg/dL — AB (ref 65–99)
POTASSIUM: 4.7 mmol/L (ref 3.5–5.1)
Phosphorus: 4.2 mg/dL (ref 2.5–4.6)
Sodium: 139 mmol/L (ref 135–145)

## 2016-05-03 LAB — CBC WITH DIFFERENTIAL/PLATELET
Basophils Absolute: 0 10*3/uL (ref 0.0–0.1)
Basophils Relative: 0 %
Eosinophils Absolute: 0 10*3/uL (ref 0.0–0.7)
Eosinophils Relative: 0 %
HEMATOCRIT: 30.3 % — AB (ref 36.0–46.0)
HEMOGLOBIN: 9.9 g/dL — AB (ref 12.0–15.0)
LYMPHS ABS: 1.1 10*3/uL (ref 0.7–4.0)
Lymphocytes Relative: 5 %
MCH: 33 pg (ref 26.0–34.0)
MCHC: 32.7 g/dL (ref 30.0–36.0)
MCV: 101 fL — ABNORMAL HIGH (ref 78.0–100.0)
MONOS PCT: 4 %
Monocytes Absolute: 0.8 10*3/uL (ref 0.1–1.0)
NEUTROS ABS: 18.1 10*3/uL — AB (ref 1.7–7.7)
NEUTROS PCT: 91 %
Platelets: 83 10*3/uL — ABNORMAL LOW (ref 150–400)
RBC: 3 MIL/uL — AB (ref 3.87–5.11)
RDW: 14.5 % (ref 11.5–15.5)
WBC: 20 10*3/uL — AB (ref 4.0–10.5)

## 2016-05-03 LAB — URINE CULTURE: Special Requests: NORMAL

## 2016-05-03 LAB — MAGNESIUM: Magnesium: 2.6 mg/dL — ABNORMAL HIGH (ref 1.7–2.4)

## 2016-05-03 LAB — PROCALCITONIN: PROCALCITONIN: 0.3 ng/mL

## 2016-05-03 MED ORDER — SODIUM CHLORIDE 0.9% FLUSH
10.0000 mL | Freq: Two times a day (BID) | INTRAVENOUS | Status: DC
  Administered 2016-05-03 (×3): 30 mL
  Administered 2016-05-04: 10 mL
  Administered 2016-05-04: 20 mL
  Administered 2016-05-05 – 2016-05-06 (×2): 10 mL
  Administered 2016-05-06: 20 mL
  Administered 2016-05-07 – 2016-05-13 (×10): 10 mL

## 2016-05-03 MED ORDER — DEXMEDETOMIDINE HCL IN NACL 200 MCG/50ML IV SOLN
0.4000 ug/kg/h | INTRAVENOUS | Status: DC
  Administered 2016-05-03 – 2016-05-05 (×8): 0.4 ug/kg/h via INTRAVENOUS
  Administered 2016-05-05: 1 ug/kg/h via INTRAVENOUS
  Filled 2016-05-03 (×9): qty 50

## 2016-05-03 MED ORDER — METHYLPREDNISOLONE SODIUM SUCC 40 MG IJ SOLR
40.0000 mg | Freq: Four times a day (QID) | INTRAMUSCULAR | Status: DC
  Administered 2016-05-03 – 2016-05-05 (×9): 40 mg via INTRAVENOUS
  Filled 2016-05-03 (×9): qty 1

## 2016-05-03 MED ORDER — BUDESONIDE 0.5 MG/2ML IN SUSP
0.5000 mg | Freq: Two times a day (BID) | RESPIRATORY_TRACT | Status: DC
  Administered 2016-05-03 – 2016-05-07 (×9): 0.5 mg via RESPIRATORY_TRACT
  Filled 2016-05-03 (×9): qty 2

## 2016-05-03 MED ORDER — PANTOPRAZOLE SODIUM 40 MG PO TBEC
40.0000 mg | DELAYED_RELEASE_TABLET | Freq: Two times a day (BID) | ORAL | Status: DC
  Administered 2016-05-03 – 2016-05-07 (×8): 40 mg via ORAL
  Filled 2016-05-03 (×8): qty 1

## 2016-05-03 MED ORDER — IPRATROPIUM-ALBUTEROL 0.5-2.5 (3) MG/3ML IN SOLN
3.0000 mL | RESPIRATORY_TRACT | Status: DC
  Administered 2016-05-03 – 2016-05-06 (×16): 3 mL via RESPIRATORY_TRACT
  Filled 2016-05-03 (×18): qty 3

## 2016-05-03 MED ORDER — SODIUM CHLORIDE 0.9% FLUSH
10.0000 mL | INTRAVENOUS | Status: DC | PRN
  Administered 2016-05-05: 20 mL
  Filled 2016-05-03: qty 40

## 2016-05-03 MED ORDER — SODIUM CHLORIDE 0.9 % IV SOLN
INTRAVENOUS | Status: DC
Start: 2016-05-03 — End: 2016-05-06
  Administered 2016-05-03: 10:00:00 via INTRAVENOUS

## 2016-05-03 NOTE — Progress Notes (Signed)
PULMONARY / CRITICAL CARE MEDICINE   Name: [name] [name] MRN: [id number] DOB: 1941-06-15    ADMISSION DATE:  05/17/16 CONSULTATION DATE:  04/23/2016  REFERRING MD:  Viann Fish, Teodora Medici  CHIEF COMPLAINT:  Can't breath  Brief Profile   75 yo obese wf quit smoking one year pta  admitted with NSTEMI, HTN urgency, and AECOPD. She has hx of CKD and difficulty IV access. She had Rt IJ CVL placed 5/19 >> developed Rt neck hematoma with airway compromise and transferred to ICU. She has been transferred to SDU as her respiratory status has improved.   SUBJECTIVE:  Pt became SOB at [facility] and she was screaming and stating she couldn't breathe. Transferred to 2H. Placed on Bipap and has felt better. Still with anxiety issues.  Bipap off this am > accessory muscle use with B wheeing   VITAL SIGNS: BP 174/70 mmHg  Pulse 107  Temp(Src) 97.6 F (36.4 C) (Oral)  Resp 16  Ht 5\' 3"  (1.6 m)  Wt 101.1 kg (222 lb 14.2 oz)  BMI 39.49 kg/m2  SpO2 97%  2lpm NP  HEMODYNAMICS:    VENTILATOR SETTINGS: Vent Mode:  [-]  FiO2 (%):  [30 %-40 %] 40 %  INTAKE / OUTPUT: I/O last 3 completed shifts: In: 438 [P.O.:358; I.V.:30; IV Piggyback:50] Out: 1475 [Urine:1475]  PHYSICAL EXAMINATION: General:  Elderly female. Obese.  Neuro:  CN in tact. A&O x4. Following commands. HEENT:  No stridor. Bruising over neck unchanged. Moist mucus membranes. Cardiovascular: Regular rate. Unable to appreciate JVD. No edema. Lungs: Bipap off > accessory muscle use with B wheezing Abdomen:  Soft. Protuberant. Nontender.  Integument:  Warm & dry. No rash on exposed skin. Multiple bruises on arms  LABS:  BMET  Recent Labs Lab 05/01/16 0358 05/02/16 0435 05/03/16 0335  NA 139 136 139  K 5.1 5.4* 4.7  CL 104 100* 100*  CO2 28 26 29   BUN 44* 52* 53*  CREATININE 1.71* 1.97* 2.01*  GLUCOSE 136* 177* 113*    Electrolytes  Recent Labs Lab 05/01/16 0353 05/01/16 0358 05/02/16 0435 05/03/16 0335   CALCIUM  --  8.6* 8.7* 8.7*  MG 2.9*  --  2.8* 2.6*  PHOS  --  4.3 4.7* 4.2    CBC  Recent Labs Lab 05/01/16 0353 05/02/16 0435 05/03/16 0335  WBC 14.9* 23.5* 20.0*  HGB 9.1* 10.3* 9.9*  HCT 28.1* 31.5* 30.3*  PLT 85* 107* 83*    Coag's No results for input(s): APTT, INR in the last 168 hours.  Sepsis Markers No results for input(s): LATICACIDVEN, PROCALCITON, O2SATVEN in the last 168 hours.  ABG  Recent Labs Lab 04/26/16 1253 05/02/16 1335 05/02/16 1645  PHART 7.506* 7.285* 7.395  PCO2ART 36.5 56.1* 46.0*  PO2ART 65.0* 83.3 74.0*    Liver Enzymes  Recent Labs Lab 05/01/16 0358 05/02/16 0435 05/03/16 0335  ALBUMIN 3.3* 3.6 3.5    Cardiac Enzymes  Recent Labs Lab 05/01/16 2245 05/02/16 0435 05/02/16 1000  TROPONINI 0.23* 0.26* 0.36*    Glucose  Recent Labs Lab 05/01/16 2101 05/02/16 0737 05/02/16 1301 05/02/16 1621 05/02/16 2153 05/03/16 0035  GLUCAP 288* 168* 144* 187* 94 125*    Imaging Dg Chest Port 1 View  05/02/2016  CLINICAL DATA:  Acute onset shortness of breath today. COPD with acute exacerbation. Myocardial infarct. Stage 4 chronic kidney disease. EXAM: PORTABLE CHEST 1 VIEW COMPARISON:  04/26/2016 FINDINGS: New asymmetric airspace disease is seen involving the left mid and lower lung, and the  right lung base to lesser extent. This may be due to asymmetric edema or infection. No definite evidence pleural effusion. Heart size remains normal. Right jugular central venous catheter remains in appropriate position. No pneumothorax visualized. IMPRESSION: New asymmetric airspace disease involving left lung greater than right. Differential diagnosis includes asymmetric edema and infection. Electronically Signed   By: Myles Rosenthal M.D.   On: 05/02/2016 14:00     STUDIES:  5/19 Echo >> mod LVH, EF 65 to 70%, grade 1 diastolic CHF 5/22 Port CXR:  R IJ in good position. No new opacity. 5/24 Port CXR:  No new focal opacity. IJ in good  position. 5/24 X-ray Neck:  Regressed density & stranding in neck. 5/24 CT neck w/o: Airway in tact. No hematoma. Indentation of posterior pharynx by anatomy. Mild stranding right anterior chest & bilateral chin could be due to cellulitis or bruising.   MICROBIOLOGY: MRSA PCR 5/19:  Negative   ANTIBIOTICS: None  SIGNIFICANT EVENTS: 5/19 Admit, cardiology consulted 5/21 Transfer to ICU with respiratory distress from Rt neck hematoma 5/22 Transferred back to SDU, tolerating  5/24- worseing status resp failure, back to ICU 5/26- Improving status transferred to SDU 5/30 transferred back to ICU 2/2 "resp distress/anxiety". Better on Bipap.   LINES/TUBES: 5/19 Rt IJ CVL >> PIV x2  ASSESSMENT / PLAN:  PULMONARY A: Acute Hypercapenic resp Fx 2/2 Exacerbation of COPD, CXR on 5/30 possible LLL infiltrate vs atx Likely with OSA Possible VCD P:   Cont Bipap > pt feels better with Bipap. May have breaks off bipap Titrate FiO2 to keep SpO2 > 92% Breo d/c'ed 5/27 due to hoarseness/ upper airway cough  Duoneb q4, pulmicort BID Continue Solu-Medrol  IV q6hr CXR 5/31 am  CARDIOVASCULAR A:  NSTEMI - Cath currently on hold. Hypertensive Urgency - BP better Acute on Chronic Diastolic CHF H/O HTN  P:  Cardiology Following Continuing to hold ASA & Heparin gtt w/ hematoma Norvasc  QD, aldactone 25 mg QD, hydralazine  q8hr, imdur  QD, lipitor  QHS Added clonidine 5/27 > improved 5/28 Continued jaw pain Rechecking Troponins: 0.23  5/29:  0.26 5/30 Check with Cards re: cath prior to DC   RENAL A:   Acute on Chronic Renal Failure Stage IV - Creatinine stable. Potassium 5.4  ( 5/30)  P:   Monitoring electrolytes & renal function daily Replete as needed Holding off on diuresis as creatinine slowly climbing Will hold off on aldactone 2/2 climbing creat  GASTROINTESTINAL A:   H/O GERD Questionable Aspiration  P:   Diet as tolerated Asp  precaution protonix bid ac     HEMATOLOGIC A:   Neck Hematoma - Resolving. Anemia - Mild. No obvious ongoing bleeding, HGB 10.3 ( 5/30) Thrombocytopenia -  Continues to improve, but remains low ( 107)  P:  Trending cell counts daily w/ CBC SCDs  INFECTIOUS A:   CXR on 5/30 possible LLL infiltrate vs atx WBC elevated, (-) fever  P:   Monitor for fever/ WBC On Ceftazidime Check CXR today, PCT > deescalate accordingly    ENDOCRINE A:   Hyperglycemia - BG controlled.   P:   Accu-Checks q4hr SSI per resistant algorithm  NEUROLOGIC A:   H/O CVA H/O Anxiety H/O Depression  P:   Monitor clinically Ativan IV prn Dilaudid IV prn Tylenol prn Continue Lexapro at  daily (home dose ) PT consult Try precedex today and see if it helps with anxiety.    Critical care time with this pt  today :  J. Alexis Frock, MD 05/03/2016, 8:09 AM [place] [facility] Pager [phone] After 3 pm or if no answer, call [phone]

## 2016-05-03 NOTE — Progress Notes (Signed)
Subjective: Pt with increased SOB this AM   No CP    Objective: Filed Vitals:   05/03/16 0600 05/03/16 0700 05/03/16 0745 05/03/16 0757  BP: 158/75 174/70    Pulse: 103 107    Temp:    97.2 F (36.2 C)  TempSrc:    Oral  Resp: 12 16    Height:      Weight:      SpO2: 97% 99% 97%    Weight change: -2 lb 14.6 oz (-1.322 kg)  Intake/Output Summary (Last 24 hours) at 05/03/16 0759 Last data filed at 05/03/16 0400  Gross per 24 hour  Intake    438 ml  Output   1175 ml  Net   -737 ml   Net neg 3.8 L   General: Alert, awake, oriented x3, in no acute distress Neck:  Neck full   Heart: Regular rate and rhythm, without murmurs, rubs, gallops.  Lungs: Rel clear     No rales or wheezes. Exemities:  No edema.   Neuro: Grossly intact, nonfocal.   Lab Results: Results for orders placed or performed during the hospital encounter of 04/04/2016 (from the past 24 hour(s))  Troponin I (q 6hr x 3)     Status: Abnormal   Collection Time: 05/02/16 10:00 AM  Result Value Ref Range   Troponin I 0.36 (H) <0.031 ng/mL  Glucose, capillary     Status: Abnormal   Collection Time: 05/02/16  1:01 PM  Result Value Ref Range   Glucose-Capillary 144 (H) 65 - 99 mg/dL  Blood gas, arterial     Status: Abnormal   Collection Time: 05/02/16  1:35 PM  Result Value Ref Range   O2 Content 5.0 L/min   Delivery systems NASAL CANNULA    pH, Arterial 7.285 (L) 7.350 - 7.450   pCO2 arterial 56.1 (H) 35.0 - 45.0 mmHg   pO2, Arterial 83.3 80.0 - 100.0 mmHg   Bicarbonate 25.8 (H) 20.0 - 24.0 mEq/L   TCO2 27.6 0 - 100 mmol/L   Acid-base deficit 0.0 0.0 - 2.0 mmol/L   O2 Saturation 94.9 %   Patient temperature 98.6    Collection site RIGHT RADIAL    Drawn by [phone]    Sample type ARTERIAL    Allens test (pass/fail) PASS PASS  Glucose, capillary     Status: Abnormal   Collection Time: 05/02/16  4:21 PM  Result Value Ref Range   Glucose-Capillary 187 (H) 65 - 99 mg/dL   Comment 1 Capillary Specimen     I-STAT 3, arterial blood gas (G3+)     Status: Abnormal   Collection Time: 05/02/16  4:45 PM  Result Value Ref Range   pH, Arterial 7.395 7.350 - 7.450   pCO2 arterial 46.0 (H) 35.0 - 45.0 mmHg   pO2, Arterial 74.0 (L) 80.0 - 100.0 mmHg   Bicarbonate 28.2 (H) 20.0 - 24.0 mEq/L   TCO2 30 0 - 100 mmol/L   O2 Saturation 94.0 %   Acid-Base Excess 3.0 (H) 0.0 - 2.0 mmol/L   Patient temperature 98.6 F    Collection site RADIAL, ALLEN'S TEST ACCEPTABLE    Sample type ARTERIAL   Glucose, capillary     Status: None   Collection Time: 05/02/16  9:53 PM  Result Value Ref Range   Glucose-Capillary 94 65 - 99 mg/dL   Comment 1 Capillary Specimen    Comment 2 Notify RN   Glucose, capillary     Status: Abnormal   Collection  Time: 05/03/16 12:35 AM  Result Value Ref Range   Glucose-Capillary 125 (H) 65 - 99 mg/dL   Comment 1 Capillary Specimen    Comment 2 Notify RN   Renal function panel     Status: Abnormal   Collection Time: 05/03/16  3:35 AM  Result Value Ref Range   Sodium 139 135 - 145 mmol/L   Potassium 4.7 3.5 - 5.1 mmol/L   Chloride 100 (L) 101 - 111 mmol/L   CO2 29 22 - 32 mmol/L   Glucose, Bld 113 (H) 65 - 99 mg/dL   BUN 53 (H) 6 - 20 mg/dL   Creatinine, Ser 9.15 (H) 0.44 - 1.00 mg/dL   Calcium 8.7 (L) 8.9 - 10.3 mg/dL   Phosphorus 4.2 2.5 - 4.6 mg/dL   Albumin 3.5 3.5 - 5.0 g/dL   GFR calc non Af Amer 23 (L) >60 mL/min   GFR calc Af Amer 27 (L) >60 mL/min   Anion gap 10 5 - 15  Magnesium     Status: Abnormal   Collection Time: 05/03/16  3:35 AM  Result Value Ref Range   Magnesium 2.6 (H) 1.7 - 2.4 mg/dL  CBC with Differential/Platelet     Status: Abnormal   Collection Time: 05/03/16  3:35 AM  Result Value Ref Range   WBC 20.0 (H) 4.0 - 10.5 K/uL   RBC 3.00 (L) 3.87 - 5.11 MIL/uL   Hemoglobin 9.9 (L) 12.0 - 15.0 g/dL   HCT 05.6 (L) 97.9 - 48.0 %   MCV 101.0 (H) 78.0 - 100.0 fL   MCH 33.0 26.0 - 34.0 pg   MCHC 32.7 30.0 - 36.0 g/dL   RDW 16.5 53.7 - 48.2 %    Platelets 83 (L) 150 - 400 K/uL   Neutrophils Relative % 91 %   Neutro Abs 18.1 (H) 1.7 - 7.7 K/uL   Lymphocytes Relative 5 %   Lymphs Abs 1.1 0.7 - 4.0 K/uL   Monocytes Relative 4 %   Monocytes Absolute 0.8 0.1 - 1.0 K/uL   Eosinophils Relative 0 %   Eosinophils Absolute 0.0 0.0 - 0.7 K/uL   Basophils Relative 0 %   Basophils Absolute 0.0 0.0 - 0.1 K/uL    Studies/Results: Dg Chest Port 1 View  05/02/2016  CLINICAL DATA:  Acute onset shortness of breath today. COPD with acute exacerbation. Myocardial infarct. Stage 4 chronic kidney disease. EXAM: PORTABLE CHEST 1 VIEW COMPARISON:  04/26/2016 FINDINGS: New asymmetric airspace disease is seen involving the left mid and lower lung, and the right lung base to lesser extent. This may be due to asymmetric edema or infection. No definite evidence pleural effusion. Heart size remains normal. Right jugular central venous catheter remains in appropriate position. No pneumothorax visualized. IMPRESSION: New asymmetric airspace disease involving left lung greater than right. Differential diagnosis includes asymmetric edema and infection. Electronically Signed   By: Myles Rosenthal M.D.   On: 05/02/2016 14:00    Medications: REviewed  @PROBHOSP @  1  NSTEMI  Yesterday had trivial bump in troponin  I do not think there is active ischemia    2  Acute on chronic diastolic CHF    Hold lasix with slight bump in creatinine  FOllow I/O    3.  PE  4  COPD  CXR just done  ? Pneumonia  CCM following     5  Renal    LOS: 12 days   Dietrich Pates 05/03/2016, 7:59 AM

## 2016-05-03 NOTE — Progress Notes (Signed)
RT NOTE:  Pt removed from BIPAP and transitioned to 55% Venti mask. Pt tolerating well at this time. RN will administer meds and mouth care and RT/RN will monitor patient. BIPAP on standby

## 2016-05-03 NOTE — Progress Notes (Signed)
PT Cancellation Note  Patient Details Name: [name] [name] MRN: [id number] DOB: June 21, 1941   Cancelled Treatment:    Reason Eval/Treat Not Completed: Medical issues which prohibited therapy. Noted recent respiratory distress. Spoke with [employer], Charity fundraiser. Patient recently started on precedex and just taken off BiPap.    Hezzie Karim 05/03/2016, 10:52 AM  Pager [phone]

## 2016-05-03 NOTE — Progress Notes (Signed)
Inpatient Rehabilitation  Following patient at a distance for timing to discuss team's recommendation for IP Rehab.  Noted on going difficulty breathing.  Plan to continue to follow for completion of medical work-up, heart cath when medically stable. Please call with questions.  Charlane Ferretti., CCC/SLP Admission Coordinator  [facility] Inpatient Rehabilitation  Cell [phone]

## 2016-05-04 LAB — GLUCOSE, CAPILLARY
GLUCOSE-CAPILLARY: 157 mg/dL — AB (ref 65–99)
GLUCOSE-CAPILLARY: 170 mg/dL — AB (ref 65–99)
GLUCOSE-CAPILLARY: 159 mg/dL — AB (ref 65–99)
Glucose-Capillary: 175 mg/dL — ABNORMAL HIGH (ref 65–99)
Glucose-Capillary: 205 mg/dL — ABNORMAL HIGH (ref 65–99)
Glucose-Capillary: 144 mg/dL — ABNORMAL HIGH (ref 65–99)

## 2016-05-04 LAB — CBC
HCT: 26.8 % — ABNORMAL LOW (ref 36.0–46.0)
HEMOGLOBIN: 8.6 g/dL — AB (ref 12.0–15.0)
MCH: 33.2 pg (ref 26.0–34.0)
MCHC: 32.1 g/dL (ref 30.0–36.0)
MCV: 103.5 fL — ABNORMAL HIGH (ref 78.0–100.0)
Platelets: 64 10*3/uL — ABNORMAL LOW (ref 150–400)
RBC: 2.59 MIL/uL — AB (ref 3.87–5.11)
RDW: 14.4 % (ref 11.5–15.5)
WBC: 9.7 10*3/uL (ref 4.0–10.5)

## 2016-05-04 LAB — URINALYSIS, ROUTINE W REFLEX MICROSCOPIC
Bilirubin Urine: NEGATIVE
Glucose, UA: NEGATIVE mg/dL
KETONES UR: NEGATIVE mg/dL
NITRITE: NEGATIVE
PH: 5 (ref 5.0–8.0)
PROTEIN: NEGATIVE mg/dL
Specific Gravity, Urine: 1.021 (ref 1.005–1.030)

## 2016-05-04 LAB — RENAL FUNCTION PANEL
ALBUMIN: 3 g/dL — AB (ref 3.5–5.0)
Anion gap: 7 (ref 5–15)
BUN: 58 mg/dL — AB (ref 6–20)
CO2: 30 mmol/L (ref 22–32)
CREATININE: 1.94 mg/dL — AB (ref 0.44–1.00)
Calcium: 8.6 mg/dL — ABNORMAL LOW (ref 8.9–10.3)
Chloride: 103 mmol/L (ref 101–111)
GFR calc Af Amer: 28 mL/min — ABNORMAL LOW (ref >60)
GFR, EST NON AFRICAN AMERICAN: 24 mL/min — AB (ref >60)
Glucose, Bld: 170 mg/dL — ABNORMAL HIGH (ref 65–99)
PHOSPHORUS: 5 mg/dL — AB (ref 2.5–4.6)
Potassium: 4.6 mmol/L (ref 3.5–5.1)
Sodium: 140 mmol/L (ref 135–145)

## 2016-05-04 LAB — URINE MICROSCOPIC-ADD ON

## 2016-05-04 LAB — MAGNESIUM: MAGNESIUM: 2.8 mg/dL — AB (ref 1.7–2.4)

## 2016-05-04 LAB — PROCALCITONIN: PROCALCITONIN: 0.26 ng/mL

## 2016-05-04 MED ORDER — CLONAZEPAM 0.5 MG PO TABS
0.2500 mg | ORAL_TABLET | Freq: Two times a day (BID) | ORAL | Status: DC
  Administered 2016-05-04 – 2016-05-07 (×7): 0.25 mg via ORAL
  Filled 2016-05-04 (×7): qty 1

## 2016-05-04 MED ORDER — ESCITALOPRAM OXALATE 10 MG PO TABS
20.0000 mg | ORAL_TABLET | Freq: Every day | ORAL | Status: DC
  Administered 2016-05-05 – 2016-05-07 (×3): 20 mg via ORAL
  Filled 2016-05-04 (×3): qty 2

## 2016-05-04 NOTE — Care Management Important Message (Signed)
Important Message  Patient Details  Name: [name] [name] MRN: [id number] Date of Birth: 1941-07-08   Medicare Important Message Given:  Yes    Bernadette Hoit 05/04/2016, 10:16 AM

## 2016-05-04 NOTE — Progress Notes (Signed)
PT Cancellation Note  Patient Details Name: [name] [name] MRN: [id number] DOB: [birth year]   Cancelled Treatment:    Reason Eval/Treat Not Completed: Medical issues which prohibited therapy   Spoke with RN. Pt with recent "panic attack" and feeling she can't breathe. Required incr precedex and other anxiety meds that now have pt sleeping. Will continue to attempt to see.   Firas Guardado 05/04/2016, 1:45 PM  Pager [phone]

## 2016-05-04 NOTE — Progress Notes (Signed)
Subjective: No CP  She is SOB   Objective: Filed Vitals:   05/04/16 0600 05/04/16 0735 05/04/16 0753 05/04/16 0829  BP: 143/63 148/72    Pulse: 80 84  81  Temp:   98 F (36.7 C)   TempSrc:   Axillary   Resp: Height:      Weight:      SpO2: 97% 97%  97%   Weight change: -7 lb 1.9 oz (-3.23 kg)  Intake/Output Summary (Last 24 hours) at 05/04/16 0904 Last data filed at 05/04/16 0600  Gross per 24 hour  Intake  852.1 ml  Output   1130 ml  Net -277.9 ml    General: Alert, awake  Appears mildly agitated due to SOB   Neck:  JVP is normal Heart: Regular rate and rhythm, without murmurs, rubs, gallops.  Lungs: Clear to auscultation.  No rales or wheezes. Exemities:  No edema.   Neuro: Grossly intact, nonfocal.   Lab Results: Results for orders placed or performed during the hospital encounter of 04/08/2016 (from the past 24 hour(s))  Procalcitonin - Baseline     Status: None   Collection Time: 05/03/16 10:06 AM  Result Value Ref Range   Procalcitonin 0.30 ng/mL  Glucose, capillary     Status: Abnormal   Collection Time: 05/03/16 11:35 AM  Result Value Ref Range   Glucose-Capillary 127 (H) 65 - 99 mg/dL   Comment 1 Capillary Specimen   Glucose, capillary     Status: Abnormal   Collection Time: 05/03/16  4:14 PM  Result Value Ref Range   Glucose-Capillary 159 (H) 65 - 99 mg/dL   Comment 1 Capillary Specimen   Glucose, capillary     Status: Abnormal   Collection Time: 05/03/16 10:15 PM  Result Value Ref Range   Glucose-Capillary 144 (H) 65 - 99 mg/dL   Comment 1 Capillary Specimen   CBC     Status: Abnormal   Collection Time: 05/04/16  3:05 AM  Result Value Ref Range   WBC 9.7 4.0 - 10.5 K/uL   RBC 2.59 (L) 3.87 - 5.11 MIL/uL   Hemoglobin 8.6 (L) 12.0 - 15.0 g/dL   HCT 16.1 (L) 09.6 - 04.5 %   MCV 103.5 (H) 78.0 - 100.0 fL   MCH 33.2 26.0 - 34.0 pg   MCHC 32.1 30.0 - 36.0 g/dL   RDW 40.9 81.1 - 91.4 %   Platelets 64 (L) 150 - 400 K/uL  Magnesium      Status: Abnormal   Collection Time: 05/04/16  3:05 AM  Result Value Ref Range   Magnesium 2.8 (H) 1.7 - 2.4 mg/dL  Procalcitonin     Status: None   Collection Time: 05/04/16  3:05 AM  Result Value Ref Range   Procalcitonin 0.26 ng/mL  Renal function panel     Status: Abnormal   Collection Time: 05/04/16  3:05 AM  Result Value Ref Range   Sodium 140 135 - 145 mmol/L   Potassium 4.6 3.5 - 5.1 mmol/L   Chloride 103 101 - 111 mmol/L   CO2 30 22 - 32 mmol/L   Glucose, Bld 170 (H) 65 - 99 mg/dL   BUN 58 (H) 6 - 20 mg/dL   Creatinine, Ser 7.82 (H) 0.44 - 1.00 mg/dL   Calcium 8.6 (L) 8.9 - 10.3 mg/dL   Phosphorus 5.0 (H) 2.5 - 4.6 mg/dL   Albumin 3.0 (L) 3.5 - 5.0 g/dL   GFR calc non Af  Amer 24 (L) >60 mL/min   GFR calc Af Amer 28 (L) >60 mL/min   Anion gap 7 5 - 15  Glucose, capillary     Status: Abnormal   Collection Time: 05/04/16  3:12 AM  Result Value Ref Range   Glucose-Capillary 157 (H) 65 - 99 mg/dL   Comment 1 Venous Specimen    Comment 2 Notify RN   Glucose, capillary     Status: Abnormal   Collection Time: 05/04/16  4:21 AM  Result Value Ref Range   Glucose-Capillary 175 (H) 65 - 99 mg/dL   Comment 1 Capillary Specimen   Glucose, capillary     Status: Abnormal   Collection Time: 05/04/16  7:51 AM  Result Value Ref Range   Glucose-Capillary 205 (H) 65 - 99 mg/dL   Comment 1 Capillary Specimen     Studies/Results: No results found.  Medications:Reviewed    @PROBHOSP @   1  NSTEMI  No signs of active ischemia  Pt;s resp need to improve signif before considering procedure   2  Acute on chronic diastolic CHF  Volume status is not bad  Follow I/O  3.  Pulmonary  Signif dyspnea this AM  CCM directing care    LOS: 13 days   Dietrich Pates 05/04/2016, 9:04 AM

## 2016-05-04 NOTE — Progress Notes (Signed)
RT NOTE:  Pt remains off BIPAP. Pt is tolerating 30% V/M. Will switch to [place] at later time. RT will monitor. BIPAP on standby.

## 2016-05-04 NOTE — Progress Notes (Addendum)
Pharmacy Antibiotic Note  [name] [name] is a 75 y.o. female admitted on 05/02/2016 with chest pain. CCM now worried about HCAP. Pharmacy has been consulted for ceftazidime dosing.  Day #3 of abx for HCAP.  Scr stable, cultures negative   For her anxiety, recommend increasing Lexapro back to home dose of 20 mg daily (as mentioned in note) and restart her trazodone at bedtime?  Could add Remeron at small dose for augmentation (7.5 mg po HS) or Buspar at a small dose 2.5 mg  BID?  It will take a 4 to 5 weeks to see benefit from these drugs though (short term benzo use meanwhile).  Plan: Continue ceftazidime 2g IV Q24 Monitor clinical picture, renal function F/U C&S, abx deescalation / LOT   Height: 5\' 3"  (160 cm) Weight: 220 lb 14.4 oz (100.2 kg) IBW/kg (Calculated) : 52.4  Temp (24hrs), Avg:97.5 F (36.4 C), Min:97 F (36.1 C), Max:98.2 F (36.8 C)   Recent Labs Lab 04/30/16 0542 05/01/16 0353 05/01/16 0358 05/02/16 0435 05/03/16 0335 05/04/16 0305  WBC 13.8* 14.9*  --  23.5* 20.0* 9.7  CREATININE 1.64*  --  1.71* 1.97* 2.01* 1.94*    Estimated Creatinine Clearance: 29.2 mL/min (by C-G formula based on Cr of 1.94).    Allergies  Allergen Reactions  . Ciprofloxacin     rash    Antimicrobials this admission: Ceftazidime 5/30 >>   Dose adjustments this admission: n/a  Microbiology results: MRSA PCR: negative  Thank you  Okey Regal, PharmD [phone]  05/04/2016 8:51 AM

## 2016-05-04 NOTE — Progress Notes (Signed)
PULMONARY / CRITICAL CARE MEDICINE   Name: [name] [name] MRN: [id number] DOB: 18-Dec-1940    ADMISSION DATE:  04/03/2016 CONSULTATION DATE:  04/23/2016  REFERRING MD:  Viann Fish, Teodora Medici  CHIEF COMPLAINT:  Can't breath  Brief Profile   75 yo obese wf quit smoking one year pta  admitted with NSTEMI, HTN urgency, and AECOPD. She has hx of CKD and difficulty IV access. She had Rt IJ CVL placed 5/19 >> developed Rt neck hematoma with airway compromise and transferred to ICU. She has been transferred to SDU as her respiratory status has improved.   SUBJECTIVE:  Was on the bipap for the most aprt > was tachypniec off bipap (anxiety playing a role as well) Ended up getting ativan 0.5 mg twice in the last 24 hrs.  Less SOB today.   VITAL SIGNS: BP 143/63 mmHg  Pulse 80  Temp(Src) 98.2 F (36.8 C) (Oral)  Resp 9  Ht 5\' 3"  (1.6 m)  Wt 100.2 kg (220 lb 14.4 oz)  BMI 39.14 kg/m2  SpO2 97%  2lpm NP  HEMODYNAMICS:    VENTILATOR SETTINGS: Vent Mode:  [-]  FiO2 (%):  [30 %-55 %] 30 %  INTAKE / OUTPUT: I/O last 3 completed shifts: In: 942.1 [P.O.:360; I.V.:482.1; IV Piggyback:100] Out: 2380 [Urine:2380]  PHYSICAL EXAMINATION: General:  Elderly female. Obese.  Neuro:  CN in tact. A&O x4. Following commands. HEENT:  No stridor. Bruising over neck unchanged. Moist mucus membranes. Cardiovascular: Regular rate. Unable to appreciate JVD. No edema. Lungs: Bipap on. (+) wheeze but less. Less resp effort seen while on bipap.  Abdomen:  Soft. Protuberant. Nontender.  Integument:  Warm & dry. No rash on exposed skin. Multiple bruises on arms  LABS:  BMET  Recent Labs Lab 05/02/16 0435 05/03/16 0335 05/04/16 0305  NA 136 139 140  K 5.4* 4.7 4.6  CL 100* 100* 103  CO2 26 29 30   BUN 52* 53* 58*  CREATININE 1.97* 2.01* 1.94*  GLUCOSE 177* 113* 170*    Electrolytes  Recent Labs Lab 05/02/16 0435 05/03/16 0335 05/04/16 0305  CALCIUM 8.7* 8.7* 8.6*  MG 2.8* 2.6*  2.8*  PHOS 4.7* 4.2 5.0*    CBC  Recent Labs Lab 05/02/16 0435 05/03/16 0335 05/04/16 0305  WBC 23.5* 20.0* 9.7  HGB 10.3* 9.9* 8.6*  HCT 31.5* 30.3* 26.8*  PLT 107* 83* 64*    Coag's No results for input(s): APTT, INR in the last 168 hours.  Sepsis Markers  Recent Labs Lab 05/03/16 1006 05/04/16 0305  PROCALCITON 0.30 0.26    ABG  Recent Labs Lab 05/02/16 1335 05/02/16 1645  PHART 7.285* 7.395  PCO2ART 56.1* 46.0*  PO2ART 83.3 74.0*    Liver Enzymes  Recent Labs Lab 05/02/16 0435 05/03/16 0335 05/04/16 0305  ALBUMIN 3.6 3.5 3.0*    Cardiac Enzymes  Recent Labs Lab 05/01/16 2245 05/02/16 0435 05/02/16 1000  TROPONINI 0.23* 0.26* 0.36*    Glucose  Recent Labs Lab 05/03/16 0755 05/03/16 1135 05/03/16 1614 05/03/16 2215 05/04/16 0312 05/04/16 0421  GLUCAP 116* 127* 159* 144* 157* 175*    Imaging Dg Chest Port 1 View  05/03/2016  CLINICAL DATA:  Followup exam for respiratory failure air. Increased wheezing this morning. History of COPD. EXAM: PORTABLE CHEST 1 VIEW COMPARISON:  05/02/2016 FINDINGS: Air space consolidation, most evident in the left mid lung, is similar to the previous day's study allowing for differences patient positioning and technique. No new lung abnormalities. No convincing pleural effusion. No  pneumothorax. Right internal jugular central venous line is stable with its tip in the mid superior vena cava. IMPRESSION: 1. No significant change from the previous day's study. 2. Persistent airspace lung opacity, greatest on the left, consistent with pneumonia. Electronically Signed   By: Amie Portland M.D.   On: 05/03/2016 09:09     STUDIES:  5/19 Echo >> mod LVH, EF 65 to 70%, grade 1 diastolic CHF 5/22 Port CXR:  R IJ in good position. No new opacity. 5/24 Port CXR:  No new focal opacity. IJ in good position. 5/24 X-ray Neck:  Regressed density & stranding in neck. 5/24 CT neck w/o: Airway in tact. No hematoma.  Indentation of posterior pharynx by anatomy. Mild stranding right anterior chest & bilateral chin could be due to cellulitis or bruising.   MICROBIOLOGY: MRSA PCR 5/19:  Negative   ANTIBIOTICS: None  SIGNIFICANT EVENTS: 5/19 Admit, cardiology consulted 5/21 Transfer to ICU with respiratory distress from Rt neck hematoma 5/22 Transferred back to SDU, tolerating  5/24- worseing status resp failure, back to ICU 5/26- Improving status transferred to SDU 5/30 transferred back to ICU 2/2 "resp distress/anxiety". Better on Bipap.   LINES/TUBES: 5/19 Rt IJ CVL >> PIV x2  ASSESSMENT / PLAN:  PULMONARY A: Acute Hypercapenic Resp Fx 2/2 Exacerbation of COPD, HCAP bibasilar (L > R), VCD, likely with OSA P:   Cont Bipap > pt feels better with Bipap. May have breaks off bipap Titrate FiO2 to keep SpO2 > 92% Breo d/c'ed 5/27 due to hoarseness/ upper airway cough  Duoneb q4, pulmicort BID Continue Solu-Medrol  IV q6hr Cont abx. Plan for 7d unless she worsens.   CARDIOVASCULAR A:  NSTEMI - Cath currently on hold. Hypertensive Urgency - BP better Acute on Chronic Diastolic CHF H/O HTN  P:  Cardiology Following Continuing to hold ASA & Heparin gtt w/ hematoma Norvasc  QD,clonidine,  aldactone 25 mg QD (on hold), hydralazine  q8hr, imdur  QD, lipitor  QHS Check with Cards re: cath prior to DC   RENAL A:   Acute on Chronic Renal Failure Stage IV - Creatinine stable.  P:   Monitoring electrolytes & renal function daily Replete as needed Holding off on diuresis as creatinine slowly climbing  GASTROINTESTINAL A:   H/O GERD Questionable Aspiration  P:   Diet as tolerated Asp precaution protonix bid ac     HEMATOLOGIC A:   Neck Hematoma - Resolving. Anemia - Mild. No obvious ongoing bleeding, HGB 10.3 ( 5/30) Thrombocytopenia -  Continues to improve, but remains low ( 107)  P:  Trending cell counts daily w/ CBC SCDs  INFECTIOUS A:   CXR with  Bibasilar infiltrates, L > R. Likely HCAP   P:   Monitor for fever/ WBC On Ceftazidime - plan for 7d unless she worsens   ENDOCRINE A:   Hyperglycemia - BG controlled.   P:   Accu-Checks q4hr SSI per resistant algorithm  NEUROLOGIC A:   Anxiety D/O H/O CVA H/O Depression  P:   Monitor clinically Ativan IV prn. May need longer acting benzos but at small dose 2/2 hypercapnea.  Dilaudid IV prn Tylenol prn Will increase dose of Lexapro to 20 mg Will cont precedex for now > helping with anxiety per RN.   Critical care time with this pt today :  J. Alexis Frock, MD 05/04/2016, 7:30 AM [facility] Pager [phone] After 3 pm or if no answer, call [phone]

## 2016-05-04 DEATH — deceased

## 2016-05-05 ENCOUNTER — Inpatient Hospital Stay (HOSPITAL_COMMUNITY): Payer: Medicare Other

## 2016-05-05 DIAGNOSIS — J9602 Acute respiratory failure with hypercapnia: Secondary | ICD-10-CM

## 2016-05-05 DIAGNOSIS — J9600 Acute respiratory failure, unspecified whether with hypoxia or hypercapnia: Secondary | ICD-10-CM | POA: Insufficient documentation

## 2016-05-05 LAB — RENAL FUNCTION PANEL
ALBUMIN: 2.9 g/dL — AB (ref 3.5–5.0)
Anion gap: 10 (ref 5–15)
BUN: 56 mg/dL — AB (ref 6–20)
CALCIUM: 8.8 mg/dL — AB (ref 8.9–10.3)
CO2: 27 mmol/L (ref 22–32)
Chloride: 104 mmol/L (ref 101–111)
Creatinine, Ser: 1.74 mg/dL — ABNORMAL HIGH (ref 0.44–1.00)
GFR calc Af Amer: 32 mL/min — ABNORMAL LOW (ref >60)
GFR calc non Af Amer: 28 mL/min — ABNORMAL LOW (ref >60)
GLUCOSE: 133 mg/dL — AB (ref 65–99)
PHOSPHORUS: 4.2 mg/dL (ref 2.5–4.6)
POTASSIUM: 4.4 mmol/L (ref 3.5–5.1)
SODIUM: 141 mmol/L (ref 135–145)

## 2016-05-05 LAB — GLUCOSE, CAPILLARY
GLUCOSE-CAPILLARY: 132 mg/dL — AB (ref 65–99)
Glucose-Capillary: 138 mg/dL — ABNORMAL HIGH (ref 65–99)
Glucose-Capillary: 156 mg/dL — ABNORMAL HIGH (ref 65–99)
Glucose-Capillary: 195 mg/dL — ABNORMAL HIGH (ref 65–99)
Glucose-Capillary: 251 mg/dL — ABNORMAL HIGH (ref 65–99)

## 2016-05-05 LAB — MAGNESIUM: Magnesium: 2.9 mg/dL — ABNORMAL HIGH (ref 1.7–2.4)

## 2016-05-05 LAB — PROCALCITONIN: Procalcitonin: 0.16 ng/mL

## 2016-05-05 LAB — CBC
HCT: 25.9 % — ABNORMAL LOW (ref 36.0–46.0)
Hemoglobin: 8.6 g/dL — ABNORMAL LOW (ref 12.0–15.0)
MCH: 33.1 pg (ref 26.0–34.0)
MCHC: 33.2 g/dL (ref 30.0–36.0)
MCV: 99.6 fL (ref 78.0–100.0)
Platelets: 68 10*3/uL — ABNORMAL LOW (ref 150–400)
RBC: 2.6 MIL/uL — ABNORMAL LOW (ref 3.87–5.11)
RDW: 14.2 % (ref 11.5–15.5)
WBC: 9.4 10*3/uL (ref 4.0–10.5)

## 2016-05-05 MED ORDER — DEXMEDETOMIDINE HCL IN NACL 400 MCG/100ML IV SOLN
0.4000 ug/kg/h | INTRAVENOUS | Status: DC
  Administered 2016-05-05: 0.5 ug/kg/h via INTRAVENOUS
  Administered 2016-05-05 – 2016-05-06 (×2): 0.4 ug/kg/h via INTRAVENOUS
  Filled 2016-05-05 (×4): qty 100

## 2016-05-05 MED ORDER — METHYLPREDNISOLONE SODIUM SUCC 40 MG IJ SOLR
20.0000 mg | Freq: Four times a day (QID) | INTRAMUSCULAR | Status: DC
  Administered 2016-05-05 – 2016-05-06 (×3): 20 mg via INTRAVENOUS
  Filled 2016-05-05 (×3): qty 1

## 2016-05-05 NOTE — Progress Notes (Addendum)
PULMONARY / CRITICAL CARE MEDICINE   Name: [name] [name] MRN: [id number] DOB: [birth year]    ADMISSION DATE:  04/18/2016 CONSULTATION DATE:  04/23/2016  REFERRING MD:  Viann Fish, Teodora Medici  CHIEF COMPLAINT:  Can't breath  Brief Profile  75 yo obese wf quit smoking one year pta  admitted with NSTEMI, HTN urgency, and AECOPD. She has hx of CKD and difficulty IV access. She had Rt IJ CVL placed 5/19 >> developed Rt neck hematoma with airway compromise and transferred to ICU. She has been transferred to SDU as her respiratory status has improved.   SUBJECTIVE:  Over all SOB and wheezing better last 48hrs.  Still on bipap for the most part but weaning down. Uses bipap at HS Tolerating clonazepam started 6/1.  Had panic attack this am > better now.    VITAL SIGNS: BP 144/61 mmHg  Pulse 95  Temp(Src) 97.8 F (36.6 C) (Oral)  Resp 15  Ht  (1.6 m)  Wt 101.8 kg (224 lb 6.9 oz)  BMI 39.77 kg/m2  SpO2 99%  2lpm NP  HEMODYNAMICS:    VENTILATOR SETTINGS: Vent Mode:  [-]  FiO2 (%):  [30 %-40 %] 40 %  INTAKE / OUTPUT: I/O last 3 completed shifts: In: 728.7 [P.O.:120; I.V.:558.7; IV Piggyback:50] Out: 1955 [Urine:1955]  PHYSICAL EXAMINATION: General:  Elderly female. Obese.  Neuro:  CN in tact. A&O x4. Following commands. HEENT:  No stridor. Bruising over neck unchanged. Moist mucus membranes. Cardiovascular: Regular rate. Unable to appreciate JVD. No edema. Lungs: Off bipap. Significantly less wheeze last 48 hrs Abdomen:  Soft. Protuberant. Nontender.  Integument:  Warm & dry. No rash on exposed skin. Multiple bruises on arms  LABS:  BMET  Recent Labs Lab 05/03/16 0335 05/04/16 0305 05/05/16 0305  NA 139 140 141  K 4.7 4.6 4.4  CL 100* 103 104  CO2 BUN 53* 58* 56*  CREATININE 2.01* 1.94* 1.74*  GLUCOSE 113* 170* 133*    Electrolytes  Recent Labs Lab 05/03/16 0335 05/04/16 0305 05/05/16 0305 05/05/16 0332  CALCIUM 8.7* 8.6* 8.8*  --    MG 2.6* 2.8*  --  2.9*  PHOS 4.2 5.0* 4.2  --     CBC  Recent Labs Lab 05/03/16 0335 05/04/16 0305 05/05/16 0332  WBC 20.0* 9.7 9.4  HGB 9.9* 8.6* 8.6*  HCT 30.3* 26.8* 25.9*  PLT 83* 64* 68*    Coag's No results for input(s): APTT, INR in the last 168 hours.  Sepsis Markers  Recent Labs Lab 05/03/16 1006 05/04/16 0305 05/05/16 0332  PROCALCITON 0.30 0.26 0.16    ABG  Recent Labs Lab 05/02/16 1335 05/02/16 1645  PHART 7.285* 7.395  PCO2ART 56.1* 46.0*  PO2ART 83.3 74.0*    Liver Enzymes  Recent Labs Lab 05/03/16 0335 05/04/16 0305 05/05/16 0305  ALBUMIN 3.5 3.0* 2.9*    Cardiac Enzymes  Recent Labs Lab 05/01/16 2245 05/02/16 0435 05/02/16 1000  TROPONINI 0.23* 0.26* 0.36*    Glucose  Recent Labs Lab 05/04/16 1118 05/04/16 1601 05/04/16 2122 05/05/16 0053 05/05/16 0306 05/05/16 0806  GLUCAP 144* 170* 159* 156* 132* 138*    Imaging Dg Chest Port 1 View  05/05/2016  CLINICAL DATA:  Acute respiratory failure and pneumonia, history of COPD, CHF and MI, acute renal failure. EXAM: PORTABLE CHEST 1 VIEW COMPARISON:  Portable chest x-ray of May 03, 2016. FINDINGS: The lungs are well-expanded. Confluent alveolar opacity in the left mid lung is slightly less conspicuous today.  There is a trace of pleural fluid blunting the left lateral costophrenic angle. The cardiac silhouette is mildly enlarged. The central pulmonary vascularity is prominent. The right internal jugular venous catheter tip projects over the midportion of the SVC. IMPRESSION: Slight interval improvement in the appearance of the airspace opacities in the left mid lung but significant abnormality remains. Stable mild interstitial prominence diffusely. Stable cardiomegaly. Electronically Signed   By: David  [place] M.D.   On: 05/05/2016 07:33     STUDIES:  5/19 Echo >> mod LVH, EF 65 to 70%, grade 1 diastolic CHF 5/22 Port CXR:  R IJ in good position. No new opacity. 5/24 Port CXR:   No new focal opacity. IJ in good position. 5/24 X-ray Neck:  Regressed density & stranding in neck. 5/24 CT neck w/o: Airway in tact. No hematoma. Indentation of posterior pharynx by anatomy. Mild stranding right anterior chest & bilateral chin could be due to cellulitis or bruising.   MICROBIOLOGY: MRSA PCR 5/19:  Negative   ANTIBIOTICS: Ceftaz 5/30  SIGNIFICANT EVENTS: 5/19 Admit, cardiology consulted 5/21 Transfer to ICU with respiratory distress from Rt neck hematoma 5/22 Transferred back to SDU, tolerating  5/24- worseing status resp failure, back to ICU 5/26- Improving status transferred to SDU 5/30 transferred back to ICU 2/2 "resp distress/anxiety". Better on Bipap.   LINES/TUBES: 5/19 Rt IJ CVL >> PIV x2  ASSESSMENT / PLAN:  PULMONARY A: Acute Hypoxemic Hypercapenic Resp Fx 2/2 Exacerbation of COPD, HCAP bibasilar (L > R), VCD, likely with OSA (undiagnosed) P:   Cont Bipap > pt feels better with Bipap. May have breaks off bipap. Cont to wean off bipap during daytime. Titrate FiO2 to keep SpO2 > 92% Breo d/c'ed 5/27 due to hoarseness/ upper airway cough  Duoneb q4, pulmicort BID Dec Solu-Medrol 40mg  IV q 6 to 20 mg IV q6.  Plan to cut down steroids over the weekend. Likely will need long pred taper.  Cont abx. Plan for 7d unless she worsens.   CARDIOVASCULAR A:  NSTEMI - Cath currently on hold. Hypertensive Urgency - BP better Acute on Chronic Diastolic CHF H/O HTN  P:  Cardiology Following Continuing to hold ASA & Heparin gtt w/ hematoma Norvasc 10mg  QD,clonidine,  aldactone 25 mg QD (on hold), hydralazine 50mg  q8hr, imdur 120mg  QD, lipitor 40mg  QHS Check with Cards re: cath prior to DC   RENAL A:   Acute on Chronic Renal Failure Stage IV - Creatinine stable.  P:   Monitoring electrolytes & renal function daily Replete as needed Holding off on diuresis as creatinine slowly increased. Creat in downward trend now. May resume aldactone/lasix over the  weekend if volume is an issues. She is hardly taking anything PO 2/2 being on bipap.   GASTROINTESTINAL A:   H/O GERD Questionable Aspiration  P:   Diet as tolerated Asp precaution protonix bid ac     HEMATOLOGIC A:   Neck Hematoma - Resolving. Anemia - Mild. No obvious ongoing bleeding, HGB 10.3 ( 5/30) Thrombocytopenia -  Continues to improve, but remains low ( 107)  P:  Trending cell counts daily w/ CBC SCDs  INFECTIOUS A:   CXR with Bibasilar infiltrates, L > R. Likely HCAP   P:   Clinically improved and WBC improved with abx.  Monitor for fever/ WBC On Ceftazidime - plan for 7d unless she worsens. Will probably complete 7d of ceftaz as pt at inc risk for G(-) PNA given prolonged hosp stay.    ENDOCRINE A:  Hyperglycemia - BG controlled.   P:   Accu-Checks q4hr SSI per resistant algorithm  NEUROLOGIC A:   Anxiety D/O H/O CVA H/O Depression  P:   Monitor clinically Clonazepam 0.25 mg BID started 6/1 > tolerating OK.  Consider increasing dose over weekend if with more anxiety and if she cant be off precedex.  Ativan IV prn. Requiring less last 24 hrs  Dilaudid IV prn Tylenol prn Cont Lexapro to 20 mg Cut down precedex drip.    Consider SDU if off precedex. Pt has been in and out of ICU.    Pollie Meyer, MD 05/05/2016, 9:19 AM [facility] Pager [phone] After 3 pm or if no answer, call [phone]

## 2016-05-05 NOTE — Progress Notes (Signed)
Call from bedside RN  - woken upto do blood draw and patient got anxiuopus with HR 130 sinus and some tachypnea. RN says similar earlier in AM and spotneanously settle. We will monitor situation but for now get cxr  Dr. Kalman Shan, M.D., [facility].C.P Pulmonary and Critical Care Medicine Staff Physician [facility] Pager: [phone], If no answer or between  15:00h - 7:00h: call [phone]  05/05/2016 3:52 AM

## 2016-05-05 NOTE — Progress Notes (Addendum)
Inpatient Rehabilitation  Continuing to follow patient from a distance.  Note that she has not been stable/participatory enough to work with PT since eval on 5/27.  Plan to continue to follow from a distance for timing of medical readiness, tolerance, and bed availability.  My co-worker Weldon Picking will follow up Monday 05/08/16.    Charlane Ferretti., CCC/SLP Admission Coordinator  [facility]  Cell [phone]

## 2016-05-05 NOTE — Progress Notes (Signed)
PT Cancellation Note  Patient Details Name: [name] [name] MRN: [id number] DOB: [birth year]   Cancelled Treatment:    Reason Eval/Treat Not Completed: Fatigue/lethargy limiting ability to participate (remains on low dose precedex); Medical issues which prohibited therapy  Spoke with Crystal, RN (who is very familiar with patient from this week) and patient has had her best day today. She has been able to get patient to use her arms to self-feed, pt tolerated her bath, and patient has even asked about when she can go home! Crystal has prepared pt mentally for getting OOB tomorrow (Sat 6/3) and feels it would be counter-productive to awaken her at this time. Will attempt to have patient seen by PT over weekend, however RN aware that nursing should continue to proceed with excellent progress they've made so far!   [name] [name] 05/05/2016, 5:20 PM  Pager [phone]

## 2016-05-05 NOTE — Progress Notes (Signed)
Subjective: Pt sedated  Breathing comfortably   Objective: Filed Vitals:   05/05/16 1000 05/05/16 1132 05/05/16 1158 05/05/16 1200  BP: 160/67  160/67 160/67  Pulse: 88  102 99  Temp:   97.4 F (36.3 C)   TempSrc:   Oral   Resp: 12  14 14   Height:      Weight:      SpO2: 99% 98% 98% 98%   Weight change: 1 lb 12.2 oz (0.8 kg)  Intake/Output Summary (Last 24 hours) at 05/05/16 1336 Last data filed at 05/05/16 1200  Gross per 24 hour  Intake 843.13 ml  Output   1350 ml  Net -506.87 ml    General: Alert, awake, oriented x3, in no acute distress Neck:  Neck is full, bruised Heart: Regular rate and rhythm, without murmurs, rubs, gallops.  Lungs: Rel clear anteriorly   Exemities:  Tr t 1+UE edema.  No LE edema   Neuro: Grossly intact, nonfocal.  Tele  SR    Lab Results: Results for orders placed or performed during the hospital encounter of 04/08/2016 (from the past 24 hour(s))  Glucose, capillary     Status: Abnormal   Collection Time: 05/04/16  4:01 PM  Result Value Ref Range   Glucose-Capillary 170 (H) 65 - 99 mg/dL   Comment 1 Capillary Specimen   Glucose, capillary     Status: Abnormal   Collection Time: 05/04/16  9:22 PM  Result Value Ref Range   Glucose-Capillary 159 (H) 65 - 99 mg/dL   Comment 1 Capillary Specimen    Comment 2 Notify RN   Glucose, capillary     Status: Abnormal   Collection Time: 05/05/16 12:53 AM  Result Value Ref Range   Glucose-Capillary 156 (H) 65 - 99 mg/dL   Comment 1 Capillary Specimen    Comment 2 Notify RN   Renal function panel     Status: Abnormal   Collection Time: 05/05/16  3:05 AM  Result Value Ref Range   Sodium 141 135 - 145 mmol/L   Potassium 4.4 3.5 - 5.1 mmol/L   Chloride 104 101 - 111 mmol/L   CO2 27 22 - 32 mmol/L   Glucose, Bld 133 (H) 65 - 99 mg/dL   BUN 56 (H) 6 - 20 mg/dL   Creatinine, Ser 8.45 (H) 0.44 - 1.00 mg/dL   Calcium 8.8 (L) 8.9 - 10.3 mg/dL   Phosphorus 4.2 2.5 - 4.6 mg/dL   Albumin 2.9 (L) 3.5 -  5.0 g/dL   GFR calc non Af Amer 28 (L) >60 mL/min   GFR calc Af Amer 32 (L) >60 mL/min   Anion gap 10 5 - 15  Glucose, capillary     Status: Abnormal   Collection Time: 05/05/16  3:06 AM  Result Value Ref Range   Glucose-Capillary 132 (H) 65 - 99 mg/dL   Comment 1 Capillary Specimen    Comment 2 Notify RN   CBC     Status: Abnormal   Collection Time: 05/05/16  3:32 AM  Result Value Ref Range   WBC 9.4 4.0 - 10.5 K/uL   RBC 2.60 (L) 3.87 - 5.11 MIL/uL   Hemoglobin 8.6 (L) 12.0 - 15.0 g/dL   HCT 36.4 (L) 68.0 - 32.1 %   MCV 99.6 78.0 - 100.0 fL   MCH 33.1 26.0 - 34.0 pg   MCHC 33.2 30.0 - 36.0 g/dL   RDW 22.4 82.5 - 00.3 %   Platelets 68 (L) 150 -  400 K/uL  Magnesium     Status: Abnormal   Collection Time: 05/05/16  3:32 AM  Result Value Ref Range   Magnesium 2.9 (H) 1.7 - 2.4 mg/dL  Procalcitonin     Status: None   Collection Time: 05/05/16  3:32 AM  Result Value Ref Range   Procalcitonin 0.16 ng/mL  Glucose, capillary     Status: Abnormal   Collection Time: 05/05/16  8:06 AM  Result Value Ref Range   Glucose-Capillary 138 (H) 65 - 99 mg/dL  Glucose, capillary     Status: Abnormal   Collection Time: 05/05/16 12:01 PM  Result Value Ref Range   Glucose-Capillary 195 (H) 65 - 99 mg/dL   Comment 1 Notify RN     Studies/Results: Dg Chest Port 1 View  05/05/2016  CLINICAL DATA:  Acute respiratory failure and pneumonia, history of COPD, CHF and MI, acute renal failure. EXAM: PORTABLE CHEST 1 VIEW COMPARISON:  Portable chest x-ray of May 03, 2016. FINDINGS: The lungs are well-expanded. Confluent alveolar opacity in the left mid lung is slightly less conspicuous today. There is a trace of pleural fluid blunting the left lateral costophrenic angle. The cardiac silhouette is mildly enlarged. The central pulmonary vascularity is prominent. The right internal jugular venous catheter tip projects over the midportion of the SVC. IMPRESSION: Slight interval improvement in the appearance of  the airspace opacities in the left mid lung but significant abnormality remains. Stable mild interstitial prominence diffusely. Stable cardiomegaly. Electronically Signed   By: David  [place] M.D.   On: 05/05/2016 07:33    Medications:Reviewed     @  1  NSTEMI  Pt without evid of active ischemia WIll need eval when close to d/c (initial thought of cath)  2  Acute on chronic diastolic CHF Agree with plans to hold diruetic while follow renal function  Watch I/O   3  Pulmonary  Pt breathing comfortably with [place] (99% sat) while on Precedex.    [name] [name]      LOS: 14 days   [name] [name] 05/05/2016, 1:36 PM

## 2016-05-06 DIAGNOSIS — R072 Precordial pain: Secondary | ICD-10-CM

## 2016-05-06 DIAGNOSIS — J9600 Acute respiratory failure, unspecified whether with hypoxia or hypercapnia: Secondary | ICD-10-CM

## 2016-05-06 LAB — MAGNESIUM: Magnesium: 3 mg/dL — ABNORMAL HIGH (ref 1.7–2.4)

## 2016-05-06 LAB — GLUCOSE, CAPILLARY
GLUCOSE-CAPILLARY: 159 mg/dL — AB (ref 65–99)
GLUCOSE-CAPILLARY: 116 mg/dL — AB (ref 65–99)
Glucose-Capillary: 147 mg/dL — ABNORMAL HIGH (ref 65–99)
Glucose-Capillary: 140 mg/dL — ABNORMAL HIGH (ref 65–99)
Glucose-Capillary: 129 mg/dL — ABNORMAL HIGH (ref 65–99)

## 2016-05-06 LAB — RENAL FUNCTION PANEL
Albumin: 2.7 g/dL — ABNORMAL LOW (ref 3.5–5.0)
Anion gap: 10 (ref 5–15)
BUN: 67 mg/dL — AB (ref 6–20)
CALCIUM: 8.7 mg/dL — AB (ref 8.9–10.3)
CO2: 27 mmol/L (ref 22–32)
Chloride: 100 mmol/L — ABNORMAL LOW (ref 101–111)
Creatinine, Ser: 1.78 mg/dL — ABNORMAL HIGH (ref 0.44–1.00)
GFR calc Af Amer: 31 mL/min — ABNORMAL LOW (ref >60)
GFR calc non Af Amer: 27 mL/min — ABNORMAL LOW (ref >60)
GLUCOSE: 167 mg/dL — AB (ref 65–99)
PHOSPHORUS: 3.9 mg/dL (ref 2.5–4.6)
POTASSIUM: 4.4 mmol/L (ref 3.5–5.1)
SODIUM: 137 mmol/L (ref 135–145)

## 2016-05-06 LAB — CBC
HCT: 25.6 % — ABNORMAL LOW (ref 36.0–46.0)
Hemoglobin: 8.4 g/dL — ABNORMAL LOW (ref 12.0–15.0)
MCH: 33.2 pg (ref 26.0–34.0)
MCHC: 32.8 g/dL (ref 30.0–36.0)
MCV: 101.2 fL — ABNORMAL HIGH (ref 78.0–100.0)
Platelets: 67 10*3/uL — ABNORMAL LOW (ref 150–400)
RBC: 2.53 MIL/uL — ABNORMAL LOW (ref 3.87–5.11)
RDW: 14.2 % (ref 11.5–15.5)
WBC: 9.3 10*3/uL (ref 4.0–10.5)

## 2016-05-06 MED ORDER — PREDNISONE 20 MG PO TABS
20.0000 mg | ORAL_TABLET | Freq: Every day | ORAL | Status: DC
  Administered 2016-05-06 – 2016-05-07 (×2): 20 mg via ORAL
  Filled 2016-05-06 (×2): qty 1

## 2016-05-06 MED ORDER — SODIUM CHLORIDE 0.9 % IV SOLN: INTRAVENOUS | Status: DC | PRN

## 2016-05-06 MED ORDER — IPRATROPIUM-ALBUTEROL 0.5-2.5 (3) MG/3ML IN SOLN
3.0000 mL | RESPIRATORY_TRACT | Status: DC | PRN
  Administered 2016-05-07: 3 mL via RESPIRATORY_TRACT
  Filled 2016-05-06: qty 3

## 2016-05-06 MED ORDER — ACETAMINOPHEN 325 MG PO TABS: 650.0000 mg | ORAL_TABLET | Freq: Four times a day (QID) | ORAL | Status: DC | PRN

## 2016-05-06 MED ORDER — ARFORMOTEROL TARTRATE 15 MCG/2ML IN NEBU
15.0000 ug | INHALATION_SOLUTION | Freq: Two times a day (BID) | RESPIRATORY_TRACT | Status: DC
  Administered 2016-05-06 – 2016-05-07 (×3): 15 ug via RESPIRATORY_TRACT
  Filled 2016-05-06 (×2): qty 2

## 2016-05-06 NOTE — Progress Notes (Signed)
PULMONARY / CRITICAL CARE MEDICINE   Name: Taneshia Lorence MRN: [id number] DOB: [birth year]    ADMISSION DATE:  04/14/2016 CONSULTATION DATE:  04/23/2016  REFERRING MD:  Viann Fish, Teodora Medici  CHIEF COMPLAINT:  Can't breath  Brief Profile  75 yo obese wf quit smoking one year pta  admitted with NSTEMI, HTN urgency, and AECOPD. She has hx of CKD and difficulty IV access. She had Rt IJ CVL placed 5/19 >> developed Rt neck hematoma with airway compromise and transferred to ICU. She has been transferred to SDU as her respiratory status has improved.   SUBJECTIVE:  Feels less anxious.  Has not needed BiPAP.  VITAL SIGNS: BP 172/73 mmHg  Pulse 84  Temp(Src) 99.2 F (37.3 C) (Oral)  Resp 9  Ht  (1.6 m)  Wt 225 lb 12 oz (102.4 kg)  BMI 40.00 kg/m2  SpO2 98%   INTAKE / OUTPUT: I/O last 3 completed shifts: In: 1800.5 [P.O.:1080; I.V.:670.5; IV Piggyback:50] Out: 2245 [Urine:2245]  PHYSICAL EXAMINATION: General: alert Neuro: follows commands HEENT: ecchymosis around neck and upper chest, no stridor Cardiac: regular, no murmur Chest: no wheeze Abd: soft, non tender Ext: 2+ edema Skin: multiple areas of ecchymosis   LABS:  BMET  Recent Labs Lab 05/04/16 0305 05/05/16 0305 05/06/16 0230  NA 140 141 137  K 4.6 4.4 4.4  CL 103 104 100*  CO2 BUN 58* 56* 67*  CREATININE 1.94* 1.74* 1.78*  GLUCOSE 170* 133* 167*    Electrolytes  Recent Labs Lab 05/04/16 0305 05/05/16 0305 05/05/16 0332 05/06/16 0230  CALCIUM 8.6* 8.8*  --  8.7*  MG 2.8*  --  2.9* 3.0*  PHOS 5.0* 4.2  --  3.9    CBC  Recent Labs Lab 05/04/16 0305 05/05/16 0332 05/06/16 0230  WBC 9.7 9.4 9.3  HGB 8.6* 8.6* 8.4*  HCT 26.8* 25.9* 25.6*  PLT 64* 68* 67*    Coag's No results for input(s): APTT, INR in the last 168 hours.  Sepsis Markers  Recent Labs Lab 05/03/16 1006 05/04/16 0305 05/05/16 0332  PROCALCITON 0.30 0.26 0.16    ABG  Recent Labs Lab  05/02/16 1335 05/02/16 1645  PHART 7.285* 7.395  PCO2ART 56.1* 46.0*  PO2ART 83.3 74.0*    Liver Enzymes  Recent Labs Lab 05/04/16 0305 05/05/16 0305 05/06/16 0230  ALBUMIN 3.0* 2.9* 2.7*    Cardiac Enzymes  Recent Labs Lab 05/01/16 2245 05/02/16 0435 05/02/16 1000  TROPONINI 0.23* 0.26* 0.36*    Glucose  Recent Labs Lab 05/05/16 0053 05/05/16 0306 05/05/16 0806 05/05/16 1201 05/05/16 1552 05/05/16 2142  GLUCAP 156* 132* 138* 195* 251* 147*    Imaging No results found.   STUDIES:  5/19 Echo >> mod LVH, EF 65 to 70%, grade 1 diastolic CHF 5/24 X-ray Neck >> Regressed density & stranding in neck. 5/24 CT neck w/o >> Airway in tact. No hematoma. Indentation of posterior pharynx by anatomy. Mild stranding right anterior chest & bilateral chin could be due to cellulitis or bruising.   MICROBIOLOGY: MRSA PCR 5/19:  Negative   ANTIBIOTICS: Ceftaz 5/30 >>   SIGNIFICANT EVENTS: 5/19 Admit, cardiology consulted 5/21 Transfer to ICU with respiratory distress from Rt neck hematoma 5/22 Transferred back to SDU, tolerating  5/24- worseing status resp failure, back to ICU 5/26- Improving status transferred to SDU 5/30 transferred back to ICU 2/2 "resp distress/anxiety". Better on Bipap.  LINES/TUBES: 5/19 Rt IJ CVL >>  ASSESSMENT / PLAN:  PULMONARY  A: Acute Hypoxemic Hypercapnic Resp Fx 2/2 Exacerbation of COPD, HCAP bibasilar (L > R), VCD, likely with OSA (undiagnosed) P:   Prn BiPAP Titrate FiO2 to keep SpO2 > 92% Breo d/c'ed 5/27 due to hoarseness/ upper airway cough  Duoneb prn Pulmicort BID, brovana bid Change solumedrol to prednisone 20 mg daily and wean as tolerated   CARDIOVASCULAR A:  NSTEMI - Cath currently on hold. Hypertensive Urgency - BP better Acute on Chronic Diastolic CHF H/O HTN P:  Cardiology Following Continuing to hold ASA & Heparin gtt w/ hematoma Norvasc 10mg  QD,clonidine,  aldactone 25 mg QD (on hold), hydralazine 50mg   q8hr, imdur 120mg  QD, lipitor 40mg  QHS Check with Cards re: cath prior to DC  RENAL A:   Acute on Chronic Renal Failure Stage IV - Creatinine stable. P:   Monitoring electrolytes & renal function daily Resume lasix 20 mg daily  GASTROINTESTINAL A:   H/O GERD Questionable Aspiration P:   Diet as tolerated Asp precaution protonix bid ac    HEMATOLOGIC A:   Neck Hematoma - Resolving. Anemia - Mild. No obvious ongoing bleeding, HGB 10.3 ( 5/30) Thrombocytopenia P:  Trending cell counts daily w/ CBC SCDs  INFECTIOUS A:   CXR with Bibasilar infiltrates, L > R. Likely HCAP P:   Day 5/7 fortaz  ENDOCRINE A:   Steroid induced hyperglycemia - BG controlled.  P:   Accu-Checks q4hr SSI per resistant algorithm  NEUROLOGIC A:   Anxiety D/O H/O CVA H/O Depression P:   Try to keep off precedex Clonazepam 0.25 mg BID started 6/1 > tolerating OK Ativan IV prn. Requiring less last 24 hrs  Dilaudid IV prn Tylenol prn Continue Lexapro to 20 mg  Coralyn Helling, MD [facility] Pulmonary/Critical Care 05/06/2016, 7:45 AM Pager:  [phone] After 3pm call: [phone]

## 2016-05-06 NOTE — Progress Notes (Signed)
Pt stating bladder and perineum area is "hurting". Pt requested removal of foley. RN bladder scanned with 24ml found. RN removed foley. Will continue to monitor.

## 2016-05-06 NOTE — Progress Notes (Signed)
Patient ID: [name] [name], female   DOB: 01/07/1941, 75 y.o.   MRN: [id number]    Patient Name: [name] [name] Date of Encounter: 05/06/2016     Principal Problem:   Chest pain Active Problems:   COPD with acute exacerbation (HCC)   Hypertensive crisis   S/P IVC filter   NSTEMI (non-ST elevated myocardial infarction) (HCC)   Chronic kidney disease (CKD), stage IV (severe) (HCC)   Depression   Chronic diastolic HF (heart failure), NYHA class 2 (HCC)   Hematoma of neck   Acute renal failure superimposed on stage 4 chronic kidney disease (HCC)   Acute respiratory failure with hypoxia (HCC)   Wheezing   Anxiety disorder   Neck swelling   Morbid obesity due to excess calories (HCC)   Shortness of breath   Dysphagia   Dyspnea and respiratory abnormality   Essential hypertension   Leukocytosis   Thrombocytopenia (HCC)   Acute blood loss anemia   HCAP (healthcare-associated pneumonia)   Acute respiratory failure (HCC)    SUBJECTIVE  No chest pain and dyspnea is improved. Neck pain is improved.  CURRENT MEDS . amLODipine  10 mg Oral Daily  . arformoterol  15 mcg Nebulization BID  . atorvastatin  40 mg Oral q1800  . budesonide (PULMICORT) nebulizer solution  0.5 mg Nebulization BID  . cefTAZidime (FORTAZ)  IV  2 g Intravenous Q24H  . clonazePAM  0.25 mg Oral BID  . cloNIDine  0.1 mg Oral TID  . escitalopram  20 mg Oral Daily  . hydrALAZINE  50 mg Oral Q8H  . insulin aspart  0-20 Units Subcutaneous TID WC  . insulin aspart  0-5 Units Subcutaneous QHS  . isosorbide mononitrate  120 mg Oral Daily  . pantoprazole  40 mg Oral BID  . predniSONE  20 mg Oral Q breakfast  . sodium chloride flush  10-40 mL Intracatheter Q12H    OBJECTIVE  Filed Vitals:   05/06/16 0500 05/06/16 0600 05/06/16 0700 05/06/16 0844  BP: 153/77 158/71 172/73 141/75  Pulse: 89 84 84 100  Temp:    97.7 F (36.5 C)  TempSrc:    Oral  Resp: Height:      Weight:      SpO2: 96% 98% 98% 92%      Intake/Output Summary (Last 24 hours) at 05/06/16 0851 Last data filed at 05/06/16 0700  Gross per 24 hour  Intake 1467.3 ml  Output   1095 ml  Net  372.3 ml   Filed Weights   05/04/16 0319 05/05/16 0458 05/06/16 0409  Weight: 220 lb 14.4 oz (100.2 kg) 224 lb 6.9 oz (101.8 kg) 225 lb 12 oz (102.4 kg)    PHYSICAL EXAM  General: Pleasant, NAD. Neuro: Alert and oriented X 3. Moves all extremities spontaneously. Psych: Normal affect. HEENT:  Normal  Neck: Supple without bruits or JVD. Lungs:  Resp regular and unlabored, CTA. Heart: RRR no s3, s4, or murmurs. Abdomen: Soft, non-tender, non-distended, BS + x 4.  Extremities: No clubbing, cyanosis or edema. DP/PT/Radials 2+ and equal bilaterally.  Accessory Clinical Findings  CBC  Recent Labs  05/05/16 0332 05/06/16 0230  WBC 9.4 9.3  HGB 8.6* 8.4*  HCT 25.9* 25.6*  MCV 99.6 101.2*  PLT 68* 67*   Basic Metabolic Panel  Recent Labs  05/05/16 0305 Isel [id number] 05/06/16 0230  NA 141  --  137  K 4.4  --  4.4  CL 104  --  100*  CO2 27  --  27  GLUCOSE 133*  --  167*  BUN 56*  --  67*  CREATININE 1.74*  --  1.78*  CALCIUM 8.8*  --  8.7*  MG  --  2.9* 3.0*  PHOS 4.2  --  3.9   Liver Function Tests  Recent Labs  05/05/16 0305 05/06/16 0230  ALBUMIN 2.9* 2.7*   No results for input(s): LIPASE, AMYLASE in the last 72 hours. Cardiac Enzymes No results for input(s): CKTOTAL, CKMB, CKMBINDEX, TROPONINI in the last 72 hours. BNP Invalid input(s): POCBNP D-Dimer No results for input(s): DDIMER in the last 72 hours. Hemoglobin A1C No results for input(s): HGBA1C in the last 72 hours. Fasting Lipid Panel No results for input(s): CHOL, HDL, LDLCALC, TRIG, CHOLHDL, LDLDIRECT in the last 72 hours. Thyroid Function Tests No results for input(s): TSH, T4TOTAL, T3FREE, THYROIDAB in the last 72 hours.  Invalid input(s): FREET3  TELE  nsr  Radiology/Studies  Dg Neck Soft Tissue  04/26/2016  CLINICAL DATA:   75 year old female with stridor, wheezing. Initial encounter. EXAM: NECK SOFT TISSUES - 1+ VIEW COMPARISON:  04/23/2016 neck x-rays and earlier. FINDINGS: Right IJ approach venous catheter remains in place, tip not included. On the lateral view of the neck there is abnormal submental/ anterior neck subcutaneous stranding (arrow). The leftward tracheal deviation seen previously has regressed. The abnormal prevertebral soft tissue contour demonstrated on 04/23/2016 has regressed but not completely normalized (13 mm at the C2 level versus normal of 5mm or less). Mass effect on the pharynx has regressed but not resolved. No subcutaneous gas identified. No pneumothorax in the visible lung apices. IMPRESSION: Regressed but not resolved abnormal density and stranding in the neck which probably reflects resolving trans-spatial hematoma in this setting. Neck CT (IV contrast preferred) may confirm as needed. Electronically Signed   By: Odessa Fleming M.D.   On: 04/26/2016 13:18   Dg Neck Soft Tissue  04/23/2016  CLINICAL DATA:  Right neck pain/ swelling EXAM: NECK SOFT TISSUES - 1+ VIEW COMPARISON:  None. FINDINGS: Prevertebral soft tissues anterior to the upper cervical spine are markedly enlarged on the lateral view, indicating prevertebral edema. On the frontal radiograph, the trachea is deviated to the left. IMPRESSION: Suspected prevertebral edema with leftward tracheal deviation. CT neck with contrast is suggested for further characterization. Electronically Signed   By: Charline Bills M.D.   On: 04/23/2016 09:25   Dg Chest 1 View  04/09/2016  CLINICAL DATA:  Central line placement EXAM: CHEST 1 VIEW COMPARISON:  04/18/2016 chest radiograph. FINDINGS: Right internal jugular central venous catheter terminates in the middle third of the superior vena cava. Stable cardiomediastinal silhouette with top-normal heart size. No pneumothorax. No right pleural effusion. No overt pulmonary edema. Small left pleural effusion,  probably stable. Mild left basilar atelectasis. IMPRESSION: 1. Right internal jugular central venous catheter terminates in the middle third of the superior vena cava. No pneumothorax. 2. Stable small left pleural effusion with mild left basilar atelectasis. Electronically Signed   By: Delbert Phenix M.D.   On: 05/02/2016 15:48   Ct Soft Tissue Neck Wo Contrast  04/26/2016  CLINICAL DATA:  Hematoma right neck. 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 COMPARISON:  Neck radiographs 04/26/2016 FINDINGS: Pharynx and larynx: Tongue is normal. Tonsils are normal. Nasopharynx normal. There is tortuosity of the right internal carotid indenting the posterior wall of the  oropharynx. Some of this may account for prevertebral soft tissue thickening noted on x-ray. Prevertebral soft tissues measure up 12 mm in thickness on the CT but no mass or hematoma identified. Marked improvement in prevertebral soft tissue swelling since the x-ray of 04/23/2016. This may be due to resolving hematoma. Airway intact. Larynx normal. Salivary glands: Prominent parotid glands bilaterally which are fatty replaced. Submandibular glands normal bilaterally. Thyroid: Negative Lymph nodes: Negative for adenopathy in the neck. Vascular: Atherosclerotic calcification in the carotid bifurcation bilaterally. Right internal carotid artery is tortuous with retropharyngeal course indenting the posterior oropharynx. Right jugular central venous catheter in good position. No hematoma at the catheter site. Limited intracranial: Negative Visualized orbits: Not imaged Mastoids and visualized paranasal sinuses: Small air-fluid level in the sphenoid sinus. Remaining paranasal sinuses clear. Mastoid sinus clear bilaterally. Skeleton: Mild disc degeneration and spurring in the thoracic spine. No fracture or bone  lesion. Advanced degenerative change in the left TMJ and moderate degenerative change in the right TMJ. Upper chest: Mild stranding in the right upper chest subcutaneous fat. There is also similar stranding in the subcutaneous fat of the chin bilaterally left greater than right. These areas may be due to bruising or cellulitis. No frank hematoma identified. Lung apices clear. Prominent extrapleural fat in the lung apices posteriorly bilaterally. IMPRESSION: The airway is  intact.  No hematoma is identified. Prevertebral soft tissue swelling due to large patient size and medial deviation of the right internal carotid artery causing indentation of the posterior oropharynx. Currently no hematoma. Marked prevertebral soft tissue swelling on 04/23/2016 May have been edema or hematoma which has resolved. Mild stranding in the right anterior chest and bilateral chin may be due to cellulitis or bruising. Electronically Signed   By: Marlan Palau M.D.   On: 04/26/2016 16:08   Dg Chest Port 1 View  05/05/2016  CLINICAL DATA:  Acute respiratory failure and pneumonia, history of COPD, CHF and MI, acute renal failure. EXAM: PORTABLE CHEST 1 VIEW COMPARISON:  Portable chest x-ray of May 03, 2016. FINDINGS: The lungs are well-expanded. Confluent alveolar opacity in the left mid lung is slightly less conspicuous today. There is a trace of pleural fluid blunting the left lateral costophrenic angle. The cardiac silhouette is mildly enlarged. The central pulmonary vascularity is prominent. The right internal jugular venous catheter tip projects over the midportion of the SVC. IMPRESSION: Slight interval improvement in the appearance of the airspace opacities in the left mid lung but significant abnormality remains. Stable mild interstitial prominence diffusely. Stable cardiomegaly. Electronically Signed   By: David  [place] M.D.   On: 05/05/2016 07:33   Dg Chest Port 1 View  05/03/2016  CLINICAL DATA:  Followup exam for  respiratory failure air. Increased wheezing this morning. History of COPD. EXAM: PORTABLE CHEST 1 VIEW COMPARISON:  05/02/2016 FINDINGS: Air space consolidation, most evident in the left mid lung, is similar to the previous day's study allowing for differences patient positioning and technique. No new lung abnormalities. No convincing pleural effusion. No pneumothorax. Right internal jugular central venous line is stable with its tip in the mid superior vena cava. IMPRESSION: 1. No significant change from the previous day's study. 2. Persistent airspace lung opacity, greatest on the left, consistent with pneumonia. Electronically Signed   By: Amie Portland M.D.   On: 05/03/2016 09:09   Dg Chest Port 1 View  05/02/2016  CLINICAL DATA:  Acute onset shortness of breath today. COPD with acute exacerbation. Myocardial infarct. Stage 4 chronic  kidney disease. EXAM: PORTABLE CHEST 1 VIEW COMPARISON:  04/26/2016 FINDINGS: New asymmetric airspace disease is seen involving the left mid and lower lung, and the right lung base to lesser extent. This may be due to asymmetric edema or infection. No definite evidence pleural effusion. Heart size remains normal. Right jugular central venous catheter remains in appropriate position. No pneumothorax visualized. IMPRESSION: New asymmetric airspace disease involving left lung greater than right. Differential diagnosis includes asymmetric edema and infection. Electronically Signed   By: Myles Rosenthal M.D.   On: 05/02/2016 14:00   Dg Chest Port 1 View  04/26/2016  CLINICAL DATA:  75 year old female with stridor and wheezing. Initial encounter. EXAM: PORTABLE CHEST 1 VIEW COMPARISON:  0828 hours today and earlier. FINDINGS: Portable AP semi upright view at 1243 hours. Stable right IJ central line, tip at the level of the aortic arch, cephalad SVC. Stable cardiac size and mediastinal contours. No pneumothorax or pulmonary edema. No consolidation. Left lung base ventilation is stable.  No new pulmonary opacity. IMPRESSION: Stable ventilation since 0828 hours today. No new cardiopulmonary abnormality. Electronically Signed   By: Odessa Fleming M.D.   On: 04/26/2016 13:20   Dg Chest Port 1 View  04/26/2016  CLINICAL DATA:  Acute respiratory failure with hypoxia EXAM: PORTABLE CHEST - 1 VIEW COMPARISON:  04/23/2016 FINDINGS: Stable right IJ central venous catheter. Lungs are clear. Improved aeration of the left lung base. Heart size and mediastinal contours are within normal limits. No effusion evident. Right shoulder arthroplasty hardware partially visualized. IMPRESSION: Improved aeration at the left lung base. Electronically Signed   By: Corlis Leak M.D.   On: 04/26/2016 08:45   Dg Chest Port 1 View  04/08/2016  CLINICAL DATA:  Respiratory failure. EXAM: PORTABLE CHEST 1 VIEW COMPARISON:  04/25/2016. FINDINGS: Right IJ line in stable position. Mild left base atelectasis and/or infiltrate. Small left pleural effusion. No pneumothorax. Heart size normal. Right shoulder replacement. IMPRESSION: 1.  Right IJ line in stable position. 2. Mild left base atelectasis and/or infiltrate. Small left pleural effusion. No interim change. Electronically Signed   By: Maisie Fus  Register   On: 04/14/2016 07:13    ASSESSMENT AND PLAN  1. NSTEMI - she is having no anginal symptoms. Near the end of her stay, will consider additional evaluation 2. Acute on chronic diastolic heart failure - she is extubated and doing well. Continue diuresis 3. Acute on chronic renal insufficiency - her creatinine is stable. Will follow. 4. Neck hematoma - H/H is stable. Large area of ecchymosis is present but appears to be stable.   Gregg Taylor,M.D.  05/06/2016 8:51 AM

## 2016-05-07 ENCOUNTER — Inpatient Hospital Stay (HOSPITAL_COMMUNITY): Payer: Medicare Other

## 2016-05-07 LAB — POCT I-STAT 3, ART BLOOD GAS (G3+)
ACID-BASE DEFICIT: 3 mmol/L — AB (ref 0.0–2.0)
Acid-base deficit: 1 mmol/L (ref 0.0–2.0)
BICARBONATE: 23.9 meq/L (ref 20.0–24.0)
Bicarbonate: 30 mEq/L — ABNORMAL HIGH (ref 20.0–24.0)
Bicarbonate: 27 mEq/L — ABNORMAL HIGH (ref 20.0–24.0)
O2 SAT: 100 %
O2 Saturation: 100 %
O2 Saturation: 98 %
PCO2 ART: 84.5 mmHg — AB (ref 35.0–45.0)
PO2 ART: 292 mmHg — AB (ref 80.0–100.0)
PO2 ART: 103 mmHg — AB (ref 80.0–100.0)
TCO2: 32 mmol/L (ref 0–100)
TCO2: 29 mmol/L (ref 0–100)
TCO2: 25 mmol/L (ref 0–100)
pCO2 arterial: 63.8 mmHg (ref 35.0–45.0)
pCO2 arterial: 47.6 mmHg — ABNORMAL HIGH (ref 35.0–45.0)
pH, Arterial: 7.157 — CL (ref 7.350–7.450)
pH, Arterial: 7.223 — ABNORMAL LOW (ref 7.350–7.450)
pH, Arterial: 7.301 — ABNORMAL LOW (ref 7.350–7.450)
pO2, Arterial: 244 mmHg — ABNORMAL HIGH (ref 80.0–100.0)

## 2016-05-07 LAB — GLUCOSE, CAPILLARY
GLUCOSE-CAPILLARY: 119 mg/dL — AB (ref 65–99)
GLUCOSE-CAPILLARY: 128 mg/dL — AB (ref 65–99)
Glucose-Capillary: 153 mg/dL — ABNORMAL HIGH (ref 65–99)
Glucose-Capillary: 186 mg/dL — ABNORMAL HIGH (ref 65–99)

## 2016-05-07 LAB — CBC
HCT: 28.8 % — ABNORMAL LOW (ref 36.0–46.0)
HEMOGLOBIN: 9.3 g/dL — AB (ref 12.0–15.0)
MCH: 33.2 pg (ref 26.0–34.0)
MCHC: 32.3 g/dL (ref 30.0–36.0)
MCV: 102.9 fL — ABNORMAL HIGH (ref 78.0–100.0)
PLATELETS: 75 10*3/uL — AB (ref 150–400)
RBC: 2.8 MIL/uL — AB (ref 3.87–5.11)
RDW: 14.6 % (ref 11.5–15.5)
WBC: 17.6 10*3/uL — ABNORMAL HIGH (ref 4.0–10.5)

## 2016-05-07 LAB — RENAL FUNCTION PANEL
ALBUMIN: 2.9 g/dL — AB (ref 3.5–5.0)
ANION GAP: 10 (ref 5–15)
BUN: 67 mg/dL — ABNORMAL HIGH (ref 6–20)
CALCIUM: 9 mg/dL (ref 8.9–10.3)
CO2: 27 mmol/L (ref 22–32)
Chloride: 102 mmol/L (ref 101–111)
Creatinine, Ser: 1.7 mg/dL — ABNORMAL HIGH (ref 0.44–1.00)
GFR, EST AFRICAN AMERICAN: 33 mL/min — AB (ref >60)
GFR, EST NON AFRICAN AMERICAN: 29 mL/min — AB (ref >60)
GLUCOSE: 97 mg/dL (ref 65–99)
PHOSPHORUS: 3.9 mg/dL (ref 2.5–4.6)
POTASSIUM: 4.7 mmol/L (ref 3.5–5.1)
SODIUM: 139 mmol/L (ref 135–145)

## 2016-05-07 LAB — TRIGLYCERIDES: Triglycerides: 186 mg/dL — ABNORMAL HIGH (ref <150)

## 2016-05-07 LAB — PHOSPHORUS: Phosphorus: 5.4 mg/dL — ABNORMAL HIGH (ref 2.5–4.6)

## 2016-05-07 LAB — MAGNESIUM
MAGNESIUM: 2.9 mg/dL — AB (ref 1.7–2.4)
MAGNESIUM: 2.9 mg/dL — AB (ref 1.7–2.4)

## 2016-05-07 MED ORDER — CLONIDINE HCL 0.1 MG PO TABS
0.1000 mg | ORAL_TABLET | Freq: Three times a day (TID) | ORAL | Status: DC
  Administered 2016-05-07 – 2016-05-12 (×13): 0.1 mg
  Filled 2016-05-07 (×15): qty 1

## 2016-05-07 MED ORDER — FENTANYL BOLUS VIA INFUSION
25.0000 ug | INTRAVENOUS | Status: DC | PRN
  Filled 2016-05-07: qty 25

## 2016-05-07 MED ORDER — METHYLPREDNISOLONE SODIUM SUCC 40 MG IJ SOLR
40.0000 mg | Freq: Four times a day (QID) | INTRAMUSCULAR | Status: DC
  Administered 2016-05-07 – 2016-05-12 (×21): 40 mg via INTRAVENOUS
  Filled 2016-05-07 (×21): qty 1

## 2016-05-07 MED ORDER — ESCITALOPRAM OXALATE 10 MG PO TABS
20.0000 mg | ORAL_TABLET | Freq: Every day | ORAL | Status: DC
  Administered 2016-05-08 – 2016-05-13 (×6): 20 mg
  Filled 2016-05-07 (×6): qty 2

## 2016-05-07 MED ORDER — PROPOFOL 1000 MG/100ML IV EMUL
0.0000 ug/kg/min | INTRAVENOUS | Status: DC
  Administered 2016-05-07: 10 ug/kg/min via INTRAVENOUS
  Filled 2016-05-07: qty 100

## 2016-05-07 MED ORDER — CHLORHEXIDINE GLUCONATE 0.12% ORAL RINSE (MEDLINE KIT)
15.0000 mL | Freq: Two times a day (BID) | OROMUCOSAL | Status: DC
  Administered 2016-05-08 – 2016-05-13 (×11): 15 mL via OROMUCOSAL

## 2016-05-07 MED ORDER — PROPOFOL 1000 MG/100ML IV EMUL
25.0000 ug/kg/min | INTRAVENOUS | Status: DC
  Administered 2016-05-07 (×3): 50 ug/kg/min via INTRAVENOUS
  Administered 2016-05-08 (×3): 25 ug/kg/min via INTRAVENOUS
  Administered 2016-05-08: 40 ug/kg/min via INTRAVENOUS
  Administered 2016-05-08: 50 ug/kg/min via INTRAVENOUS
  Administered 2016-05-08 – 2016-05-09 (×2): 25 ug/kg/min via INTRAVENOUS
  Filled 2016-05-07 (×10): qty 100

## 2016-05-07 MED ORDER — ATORVASTATIN CALCIUM 40 MG PO TABS
40.0000 mg | ORAL_TABLET | Freq: Every day | ORAL | Status: DC
  Administered 2016-05-07 – 2016-05-12 (×6): 40 mg
  Filled 2016-05-07 (×6): qty 1

## 2016-05-07 MED ORDER — VITAL HIGH PROTEIN PO LIQD
1000.0000 mL | ORAL | Status: DC
  Administered 2016-05-07 – 2016-05-08 (×2): 1000 mL

## 2016-05-07 MED ORDER — AMLODIPINE BESYLATE 10 MG PO TABS
10.0000 mg | ORAL_TABLET | Freq: Every day | ORAL | Status: DC
  Administered 2016-05-08 – 2016-05-11 (×4): 10 mg
  Filled 2016-05-07 (×4): qty 1

## 2016-05-07 MED ORDER — HYDRALAZINE HCL 50 MG PO TABS
50.0000 mg | ORAL_TABLET | Freq: Three times a day (TID) | ORAL | Status: DC
  Administered 2016-05-07 – 2016-05-13 (×15): 50 mg
  Filled 2016-05-07 (×17): qty 1

## 2016-05-07 MED ORDER — FENTANYL CITRATE (PF) 100 MCG/2ML IJ SOLN
100.0000 ug | Freq: Once | INTRAMUSCULAR | Status: AC | PRN
  Administered 2016-05-11: 100 ug via INTRAVENOUS
  Filled 2016-05-07: qty 2

## 2016-05-07 MED ORDER — DEXTROSE 5 % IV SOLN
2.0000 g | Freq: Two times a day (BID) | INTRAVENOUS | Status: DC
  Administered 2016-05-07 – 2016-05-08 (×3): 2 g via INTRAVENOUS
  Filled 2016-05-07 (×4): qty 2

## 2016-05-07 MED ORDER — INSULIN ASPART 100 UNIT/ML ~~LOC~~ SOLN
0.0000 [IU] | SUBCUTANEOUS | Status: DC
  Administered 2016-05-07 (×2): 4 [IU] via SUBCUTANEOUS
  Administered 2016-05-08 (×2): 7 [IU] via SUBCUTANEOUS
  Administered 2016-05-08: 3 [IU] via SUBCUTANEOUS
  Administered 2016-05-08 (×2): 4 [IU] via SUBCUTANEOUS
  Administered 2016-05-08: 7 [IU] via SUBCUTANEOUS
  Administered 2016-05-09: 4 [IU] via SUBCUTANEOUS
  Administered 2016-05-09: 7 [IU] via SUBCUTANEOUS
  Administered 2016-05-09: 3 [IU] via SUBCUTANEOUS
  Administered 2016-05-09: 7 [IU] via SUBCUTANEOUS
  Administered 2016-05-09 – 2016-05-10 (×3): 4 [IU] via SUBCUTANEOUS
  Administered 2016-05-10: 3 [IU] via SUBCUTANEOUS
  Administered 2016-05-10: 7 [IU] via SUBCUTANEOUS
  Administered 2016-05-10: 11 [IU] via SUBCUTANEOUS
  Administered 2016-05-10: 3 [IU] via SUBCUTANEOUS
  Administered 2016-05-11 (×2): 4 [IU] via SUBCUTANEOUS
  Administered 2016-05-11: 3 [IU] via SUBCUTANEOUS
  Administered 2016-05-11 (×3): 4 [IU] via SUBCUTANEOUS
  Administered 2016-05-12: 11 [IU] via SUBCUTANEOUS
  Administered 2016-05-12 (×2): 7 [IU] via SUBCUTANEOUS
  Administered 2016-05-12 – 2016-05-13 (×7): 4 [IU] via SUBCUTANEOUS

## 2016-05-07 MED ORDER — FUROSEMIDE 10 MG/ML IJ SOLN: 80.0000 mg | Freq: Every day | INTRAMUSCULAR | Status: DC

## 2016-05-07 MED ORDER — VECURONIUM BROMIDE 10 MG IV SOLR
0.8000 ug/kg/min | INTRAVENOUS | Status: DC
  Administered 2016-05-07: 0.8 ug/kg/min via INTRAVENOUS
  Administered 2016-05-08: 1.1 ug/kg/min via INTRAVENOUS
  Administered 2016-05-08: 1.3 ug/kg/min via INTRAVENOUS
  Filled 2016-05-07 (×4): qty 100

## 2016-05-07 MED ORDER — FUROSEMIDE 10 MG/ML IJ SOLN
80.0000 mg | Freq: Two times a day (BID) | INTRAMUSCULAR | Status: DC
  Administered 2016-05-07 – 2016-05-08 (×2): 80 mg via INTRAVENOUS
  Filled 2016-05-07 (×3): qty 8

## 2016-05-07 MED ORDER — FENTANYL BOLUS VIA INFUSION
50.0000 ug | INTRAVENOUS | Status: DC | PRN
  Filled 2016-05-07: qty 50

## 2016-05-07 MED ORDER — FUROSEMIDE 10 MG/ML IJ SOLN: 80.0000 mg | Freq: Two times a day (BID) | INTRAMUSCULAR | Status: DC

## 2016-05-07 MED ORDER — VANCOMYCIN HCL 10 G IV SOLR
1250.0000 mg | INTRAVENOUS | Status: DC
  Administered 2016-05-08: 1250 mg via INTRAVENOUS
  Filled 2016-05-07 (×2): qty 1250

## 2016-05-07 MED ORDER — FUROSEMIDE 10 MG/ML IJ SOLN
40.0000 mg | Freq: Once | INTRAMUSCULAR | Status: AC
  Administered 2016-05-07: 40 mg via INTRAVENOUS
  Filled 2016-05-07: qty 4

## 2016-05-07 MED ORDER — VECURONIUM BOLUS VIA INFUSION
0.0800 mg/kg | Freq: Once | INTRAVENOUS | Status: AC
  Administered 2016-05-07: 8.2 mg via INTRAVENOUS
  Filled 2016-05-07: qty 9

## 2016-05-07 MED ORDER — SODIUM CHLORIDE 0.9 % IV SOLN
25.0000 ug/h | INTRAVENOUS | Status: DC
  Administered 2016-05-07: 25 ug/h via INTRAVENOUS
  Filled 2016-05-07: qty 50

## 2016-05-07 MED ORDER — ARTIFICIAL TEARS OP OINT
1.0000 "application " | TOPICAL_OINTMENT | Freq: Three times a day (TID) | OPHTHALMIC | Status: DC
  Administered 2016-05-07 – 2016-05-10 (×9): 1 via OPHTHALMIC
  Filled 2016-05-07 (×2): qty 3.5

## 2016-05-07 MED ORDER — MIDAZOLAM HCL 2 MG/2ML IJ SOLN: 1.0000 mg | INTRAMUSCULAR | Status: DC | PRN

## 2016-05-07 MED ORDER — SODIUM CHLORIDE 0.9 % IV SOLN: INTRAVENOUS | Status: DC | PRN

## 2016-05-07 MED ORDER — IPRATROPIUM-ALBUTEROL 0.5-2.5 (3) MG/3ML IN SOLN
3.0000 mL | Freq: Four times a day (QID) | RESPIRATORY_TRACT | Status: DC
  Administered 2016-05-07 – 2016-05-13 (×24): 3 mL via RESPIRATORY_TRACT
  Filled 2016-05-07 (×24): qty 3

## 2016-05-07 MED ORDER — VANCOMYCIN HCL 10 G IV SOLR
2000.0000 mg | Freq: Once | INTRAVENOUS | Status: AC
  Administered 2016-05-07: 2000 mg via INTRAVENOUS
  Filled 2016-05-07: qty 2000

## 2016-05-07 MED ORDER — ETOMIDATE 2 MG/ML IV SOLN
0.3000 mg/kg | Freq: Once | INTRAVENOUS | Status: AC
  Administered 2016-05-07: 20 mg via INTRAVENOUS

## 2016-05-07 MED ORDER — ACETAMINOPHEN 160 MG/5ML PO SOLN
650.0000 mg | Freq: Four times a day (QID) | ORAL | Status: DC | PRN
  Administered 2016-05-09: 650 mg via ORAL
  Filled 2016-05-07: qty 20.3

## 2016-05-07 MED ORDER — PRO-STAT SUGAR FREE PO LIQD
30.0000 mL | Freq: Two times a day (BID) | ORAL | Status: DC
  Administered 2016-05-07 – 2016-05-08 (×3): 30 mL
  Filled 2016-05-07 (×3): qty 30

## 2016-05-07 MED ORDER — SODIUM CHLORIDE 0.9 % IV SOLN
100.0000 ug/h | INTRAVENOUS | Status: DC
  Administered 2016-05-08: 150 ug/h via INTRAVENOUS
  Administered 2016-05-10: 100 ug/h via INTRAVENOUS
  Administered 2016-05-10: 25 ug/h via INTRAVENOUS
  Filled 2016-05-07 (×5): qty 50

## 2016-05-07 MED ORDER — CHLORHEXIDINE GLUCONATE 0.12% ORAL RINSE (MEDLINE KIT)
15.0000 mL | Freq: Two times a day (BID) | OROMUCOSAL | Status: DC
  Administered 2016-05-07 (×2): 15 mL via OROMUCOSAL

## 2016-05-07 MED ORDER — ANTISEPTIC ORAL RINSE SOLUTION (CORINZ)
7.0000 mL | Freq: Four times a day (QID) | OROMUCOSAL | Status: DC
  Administered 2016-05-07 (×2): 7 mL via OROMUCOSAL

## 2016-05-07 MED ORDER — ANTISEPTIC ORAL RINSE SOLUTION (CORINZ)
7.0000 mL | OROMUCOSAL | Status: DC
  Administered 2016-05-08 – 2016-05-13 (×55): 7 mL via OROMUCOSAL

## 2016-05-07 MED ORDER — PANTOPRAZOLE SODIUM 40 MG PO PACK
40.0000 mg | PACK | ORAL | Status: DC
  Administered 2016-05-07 – 2016-05-13 (×7): 40 mg
  Filled 2016-05-07 (×7): qty 20

## 2016-05-07 NOTE — Procedures (Signed)
Intubation Procedure Note Abigeal Poertner [id number] [birth year]  Procedure: Intubation Indications: Respiratory insufficiency  Procedure Details Consent: Unable to obtain consent because of altered level of consciousness. Time Out: Verified patient identification, verified procedure, site/side was marked, verified correct patient position, special equipment/implants available, medications/allergies/relevent history reviewed, required imaging and test results available.  Performed  Maximum sterile technique was used including gloves and hand hygiene.  MAC and 3  She already received ativan, dilaudid.  Given 20 mg etomidate.  Inserted 7.5 ETT to 23 cm at lip.  Used glidescope.  Confirmed with ausculation and CO2 detector.  Evaluation Hemodynamic Status: BP stable throughout; O2 sats: stable throughout Patient's Current Condition: stable Complications: No apparent complications Patient did tolerate procedure well. Chest X-ray ordered to verify placement.  CXR: pending.   Coralyn Helling, MD [facility] Pulmonary/Critical Care 05/07/2016, 11:29 AM Pager:  [phone] After 3pm call: [phone]

## 2016-05-07 NOTE — Progress Notes (Signed)
PULMONARY / CRITICAL CARE MEDICINE   Name: [name] [name] MRN: [id number] DOB: November 06, 1941    ADMISSION DATE:  2016/05/16 CONSULTATION DATE:  04/23/2016  REFERRING MD:  Viann Fish, Teodora Medici  CHIEF COMPLAINT:  Can't breath  Brief Profile  75 yo obese wf quit smoking one year pta  admitted with NSTEMI, HTN urgency, and AECOPD. She has hx of CKD and difficulty IV access. She had Rt IJ CVL placed 5/19 >> developed Rt neck hematoma with airway compromise and transferred to ICU. She has been transferred to SDU as her respiratory status has improved.   SUBJECTIVE:  Feels less anxious.  Back on BiPAP overnight.  VITAL SIGNS: BP 190/80 mmHg  Pulse 116  Temp(Src) 97.9 F (36.6 C) (Oral)  Resp 18  Ht 5\' 3"  (1.6 m)  Wt 227 lb 1.2 oz (103 kg)  BMI 40.23 kg/m2  SpO2 91%   INTAKE / OUTPUT: I/O last 3 completed shifts: In: 1506.3 [P.O.:1080; I.V.:376.3; IV Piggyback:50] Out: 1215 [Urine:1215]  PHYSICAL EXAMINATION: General: alert Neuro: follows commands HEENT: ecchymosis around neck and upper chest, no stridor Cardiac: regular, no murmur Chest: b/l wheeze Abd: soft, non tender Ext: 2+ edema Skin: multiple areas of ecchymosis   LABS:  BMET  Recent Labs Lab 05/05/16 0305 05/06/16 0230 05/07/16 0203  NA 141 137 139  K 4.4 4.4 4.7  CL 104 100* 102  CO2 27 27 27   BUN 56* 67* 67*  CREATININE 1.74* 1.78* 1.70*  GLUCOSE 133* 167* 97    Electrolytes  Recent Labs Lab 05/05/16 0305 05/05/16 0332 05/06/16 0230 05/07/16 0200 05/07/16 0203  CALCIUM 8.8*  --  8.7*  --  9.0  MG  --  2.9* 3.0* 2.9*  --   PHOS 4.2  --  3.9  --  3.9    CBC  Recent Labs Lab 05/05/16 0332 05/06/16 0230 05/07/16 0200  WBC 9.4 9.3 17.6*  HGB 8.6* 8.4* 9.3*  HCT 25.9* 25.6* 28.8*  PLT 68* 67* 75*    Coag's No results for input(s): APTT, INR in the last 168 hours.  Sepsis Markers  Recent Labs Lab 05/03/16 1006 05/04/16 0305 05/05/16 0332  PROCALCITON 0.30 0.26 0.16     ABG  Recent Labs Lab 05/02/16 1335 05/02/16 1645  PHART 7.285* 7.395  PCO2ART 56.1* 46.0*  PO2ART 83.3 74.0*    Liver Enzymes  Recent Labs Lab 05/05/16 0305 05/06/16 0230 05/07/16 0203  ALBUMIN 2.9* 2.7* 2.9*    Cardiac Enzymes  Recent Labs Lab 05/01/16 2245 05/02/16 0435 05/02/16 1000  TROPONINI 0.23* 0.26* 0.36*    Glucose  Recent Labs Lab 05/05/16 1552 05/05/16 2142 05/06/16 0843 05/06/16 1224 05/06/16 1530 05/06/16 2109  GLUCAP 251* 147* 159* 140* 129* 116*    Imaging Dg Chest Port 1 View  05/07/2016  CLINICAL DATA:  Respiratory distress EXAM: PORTABLE CHEST 1 VIEW COMPARISON:  05/05/2016 FINDINGS: Right central line remains in place, unchanged. Stable airspace opacities in the left mid and lower lung. New airspace opacity in the right upper lobe. Findings compatible with multifocal pneumonia. Heart is borderline in size. No effusions or acute bony abnormality. IMPRESSION: Stable patchy left lung airspace disease within the right upper lobe consolidation. Electronically Signed   By: Charlett Nose M.D.   On: 05/07/2016 07:09     STUDIES:  5/19 Echo >> mod LVH, EF 65 to 70%, grade 1 diastolic CHF 5/24 X-ray Neck >> Regressed density & stranding in neck. 5/24 CT neck w/o >> Airway in tact. No  hematoma. Indentation of posterior pharynx by anatomy. Mild stranding right anterior chest & bilateral chin could be due to cellulitis or bruising.   MICROBIOLOGY: MRSA PCR 5/19:  Negative   ANTIBIOTICS: Ceftaz 5/30 >>   SIGNIFICANT EVENTS: 5/19 Admit, cardiology consulted 5/21 Transfer to ICU with respiratory distress from Rt neck hematoma 5/22 Transferred back to SDU, tolerating  5/24- worseing status resp failure, back to ICU 5/26- Improving status transferred to SDU 5/30 transferred back to ICU 2/2 "resp distress/anxiety". Better on Bipap.  LINES/TUBES: 5/19 Rt IJ CVL >>  ASSESSMENT / PLAN:  PULMONARY A: Acute Hypoxemic Hypercapnic Resp Fx 2/2  Exacerbation of COPD, HCAP bibasilar (L > R), VCD, likely with OSA (undiagnosed) P:   Prn BiPAP Titrate FiO2 to keep SpO2 > 92% Breo d/c'ed 5/27 due to hoarseness/ upper airway cough  Duoneb prn Pulmicort BID, brovana bid Change back to solumedrol 6/04  CARDIOVASCULAR A:  NSTEMI - Cath currently on hold. Hypertensive Urgency - BP better Acute on Chronic Diastolic CHF H/O HTN P:  Holding ASA, heparin gtt in setting of hematoma >> might be able to resume soon Change lasix to 80 mg bid Continue amlodipine, lipitor, hydralazine, imdur Defer timing of cardiac cath to cardiology  RENAL A:   Acute on Chronic Renal Failure Stage IV - Creatinine stable. P:   Monitoring electrolytes & renal function daily  GASTROINTESTINAL A:   H/O GERD Questionable Aspiration P:   Diet as tolerated Asp precaution protonix bid ac    HEMATOLOGIC A:   Neck Hematoma - Resolving. Anemia - Mild. No obvious ongoing bleeding, HGB 10.3 ( 5/30) Thrombocytopenia P:  Trending cell counts daily w/ CBC SCDs  INFECTIOUS A:   CXR with Bibasilar infiltrates, L > R. Likely HCAP P:   Day 6/7 fortaz  ENDOCRINE A:   Steroid induced hyperglycemia - BG controlled.  P:   Accu-Checks q4hr SSI per resistant algorithm  NEUROLOGIC A:   Anxiety D/O H/O CVA H/O Depression P:   Try to keep off precedex Clonazepam 0.25 mg BID started 6/1 > tolerating OK Ativan IV prn. Requiring less last 24 hrs  Dilaudid IV prn Tylenol prn Continue Lexapro to 20 mg  Looks worse today.  Keep in ICU.    CC time 32 minutes.  Coralyn Helling, MD [facility] Pulmonary/Critical Care 05/07/2016, 8:41 AM Pager:  [phone] After 3pm call: [phone]

## 2016-05-07 NOTE — Progress Notes (Signed)
Poor vent synchrony.  Needs prolonged expiratory time.  Has respiratory acidosis on ABG.    Continue sedation and add nimbex.  Place Arterial line and f/u ABG.  Coralyn Helling, MD [facility] Pulmonary/Critical Care 05/07/2016, 1:03 PM Pager:  [phone] After 3pm call: [phone]

## 2016-05-07 NOTE — Progress Notes (Signed)
Progressive respiratory distress, hypoxia.  Required 100% FiO2 on BiPAP.  Started developing intermittent a fib with RVR.  Proceed with intubation.  Will add vancomycin and repeat sputum culture.  Coralyn Helling, MD [facility] Pulmonary/Critical Care 05/07/2016, 11:31 AM Pager:  [phone] After 3pm call: [phone]

## 2016-05-07 NOTE — Progress Notes (Signed)
Called Elink. Pt's BP has risen despite PRN hydralazine. Pt increasingly anxious saying it is hard to breath. Pt given lasix and dilaudid. Pt placed back on bipap. Will continue to monitor.

## 2016-05-07 NOTE — Progress Notes (Signed)
Patient ID: [name] [name], female   DOB: 12-11-1940, 75 y.o.   MRN: [id number]    Patient Name: [name] [name] Date of Encounter: 05/07/2016     Principal Problem:   Chest pain Active Problems:   COPD with acute exacerbation (HCC)   Hypertensive crisis   S/P IVC filter   NSTEMI (non-ST elevated myocardial infarction) (HCC)   Chronic kidney disease (CKD), stage IV (severe) (HCC)   Depression   Chronic diastolic HF (heart failure), NYHA class 2 (HCC)   Hematoma of neck   Acute renal failure superimposed on stage 4 chronic kidney disease (HCC)   Acute respiratory failure with hypoxia (HCC)   Wheezing   Anxiety disorder   Neck swelling   Morbid obesity due to excess calories (HCC)   Shortness of breath   Dysphagia   Dyspnea and respiratory abnormality   Essential hypertension   Leukocytosis   Thrombocytopenia (HCC)   Acute blood loss anemia   HCAP (healthcare-associated pneumonia)   Acute respiratory failure (HCC)    SUBJECTIVE  Interval worsening in respiratory function. No pain. CURRENT MEDS . amLODipine  10 mg Oral Daily  . arformoterol  15 mcg Nebulization BID  . atorvastatin  40 mg Oral q1800  . budesonide (PULMICORT) nebulizer solution  0.5 mg Nebulization BID  . cefTAZidime (FORTAZ)  IV  2 g Intravenous Q24H  . clonazePAM  0.25 mg Oral BID  . cloNIDine  0.1 mg Oral TID  . escitalopram  20 mg Oral Daily  . furosemide  80 mg Intravenous Daily  . hydrALAZINE  50 mg Oral Q8H  . insulin aspart  0-20 Units Subcutaneous TID WC  . insulin aspart  0-5 Units Subcutaneous QHS  . isosorbide mononitrate  120 mg Oral Daily  . pantoprazole  40 mg Oral BID  . predniSONE  20 mg Oral Q breakfast  . sodium chloride flush  10-40 mL Intracatheter Q12H    OBJECTIVE  Filed Vitals:   05/07/16 0600 05/07/16 0625 05/07/16 0731 05/07/16 0734  BP: 190/80     Pulse: 110 117  116  Temp:      TempSrc:      Resp: 20 18  18   Height:      Weight:      SpO2: 95% 94% 92% 91%     Intake/Output Summary (Last 24 hours) at 05/07/16 0816 Last data filed at 05/07/16 0200  Gross per 24 hour  Intake    755 ml  Output    445 ml  Net    310 ml   Filed Weights   05/05/16 0458 05/06/16 0409 05/07/16 0133  Weight: 224 lb 6.9 oz (101.8 kg) 225 lb 12 oz (102.4 kg) 227 lb 1.2 oz (103 kg)    PHYSICAL EXAM  General: morbidly obese wearing cpap/bipap. Neuro: Alert and oriented X 3. Moves all extremities spontaneously. HEENT:  Normal  Neck: Supple without bruits or JVD. Resolving hematoma Lungs:  Scattered rales and rhonchi Heart: Regular tachy, distant,  no s3, s4, or murmurs. Abdomen: Soft, non-tender, non-distended, BS + x 4.  Extremities: No clubbing, cyanosis or edema. DP/PT/Radials 2+ and equal bilaterally.  Accessory Clinical Findings  CBC  Recent Labs  05/06/16 0230 05/07/16 0200  WBC 9.3 17.6*  HGB 8.4* 9.3*  HCT 25.6* 28.8*  MCV 101.2* 102.9*  PLT 67* 75*   Basic Metabolic Panel  Recent Labs  05/06/16 0230 05/07/16 0200 05/07/16 0203  NA 137  --  139  K 4.4  --  4.7  CL 100*  --  102  CO2 27  --  27  GLUCOSE 167*  --  97  BUN 67*  --  67*  CREATININE 1.78*  --  1.70*  CALCIUM 8.7*  --  9.0  MG 3.0* 2.9*  --   PHOS 3.9  --  3.9   Liver Function Tests  Recent Labs  05/06/16 0230 05/07/16 0203  ALBUMIN 2.7* 2.9*   No results for input(s): LIPASE, AMYLASE in the last 72 hours. Cardiac Enzymes No results for input(s): CKTOTAL, CKMB, CKMBINDEX, TROPONINI in the last 72 hours. BNP Invalid input(s): POCBNP D-Dimer No results for input(s): DDIMER in the last 72 hours. Hemoglobin A1C No results for input(s): HGBA1C in the last 72 hours. Fasting Lipid Panel No results for input(s): CHOL, HDL, LDLCALC, TRIG, CHOLHDL, LDLDIRECT in the last 72 hours. Thyroid Function Tests No results for input(s): TSH, T4TOTAL, T3FREE, THYROIDAB in the last 72 hours.  Invalid input(s): FREET3  TELE  Sinus tachycardia  Radiology/Studies  Dg  Neck Soft Tissue  04/26/2016  CLINICAL DATA:  75 year old female with stridor, wheezing. Initial encounter. EXAM: NECK SOFT TISSUES - 1+ VIEW COMPARISON:  04/23/2016 neck x-rays and earlier. FINDINGS: Right IJ approach venous catheter remains in place, tip not included. On the lateral view of the neck there is abnormal submental/ anterior neck subcutaneous stranding (arrow). The leftward tracheal deviation seen previously has regressed. The abnormal prevertebral soft tissue contour demonstrated on 04/23/2016 has regressed but not completely normalized (13 mm at the C2 level versus normal of 75mm or less). Mass effect on the pharynx has regressed but not resolved. No subcutaneous gas identified. No pneumothorax in the visible lung apices. IMPRESSION: Regressed but not resolved abnormal density and stranding in the neck which probably reflects resolving trans-spatial hematoma in this setting. Neck CT (IV contrast preferred) may confirm as needed. Electronically Signed   By: Odessa Fleming M.D.   On: 04/26/2016 13:18   Dg Neck Soft Tissue  04/23/2016  CLINICAL DATA:  Right neck pain/ swelling EXAM: NECK SOFT TISSUES - 1+ VIEW COMPARISON:  None. FINDINGS: Prevertebral soft tissues anterior to the upper cervical spine are markedly enlarged on the lateral view, indicating prevertebral edema. On the frontal radiograph, the trachea is deviated to the left. IMPRESSION: Suspected prevertebral edema with leftward tracheal deviation. CT neck with contrast is suggested for further characterization. Electronically Signed   By: Charline Bills M.D.   On: 04/23/2016 09:25   Dg Chest 1 View  05/12/16  CLINICAL DATA:  Central line placement EXAM: CHEST 1 VIEW COMPARISON:  04/18/2016 chest radiograph. FINDINGS: Right internal jugular central venous catheter terminates in the middle third of the superior vena cava. Stable cardiomediastinal silhouette with top-normal heart size. No pneumothorax. No right pleural effusion. No overt  pulmonary edema. Small left pleural effusion, probably stable. Mild left basilar atelectasis. IMPRESSION: 1. Right internal jugular central venous catheter terminates in the middle third of the superior vena cava. No pneumothorax. 2. Stable small left pleural effusion with mild left basilar atelectasis. Electronically Signed   By: Delbert Phenix M.D.   On: 12-May-2016 15:48   Ct Soft Tissue Neck Wo Contrast  04/26/2016  CLINICAL DATA:  Hematoma right neck. 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 COMPARISON:  Neck radiographs 04/26/2016 FINDINGS: Pharynx and larynx: Tongue is normal. Tonsils are normal. Nasopharynx normal. There is tortuosity of  the right internal carotid indenting the posterior wall of the oropharynx. Some of this may account for prevertebral soft tissue thickening noted on x-ray. Prevertebral soft tissues measure up 12 mm in thickness on the CT but no mass or hematoma identified. Marked improvement in prevertebral soft tissue swelling since the x-ray of 04/23/2016. This may be due to resolving hematoma. Airway intact. Larynx normal. Salivary glands: Prominent parotid glands bilaterally which are fatty replaced. Submandibular glands normal bilaterally. Thyroid: Negative Lymph nodes: Negative for adenopathy in the neck. Vascular: Atherosclerotic calcification in the carotid bifurcation bilaterally. Right internal carotid artery is tortuous with retropharyngeal course indenting the posterior oropharynx. Right jugular central venous catheter in good position. No hematoma at the catheter site. Limited intracranial: Negative Visualized orbits: Not imaged Mastoids and visualized paranasal sinuses: Small air-fluid level in the sphenoid sinus. Remaining paranasal sinuses clear. Mastoid sinus clear bilaterally. Skeleton: Mild disc degeneration and spurring  in the thoracic spine. No fracture or bone lesion. Advanced degenerative change in the left TMJ and moderate degenerative change in the right TMJ. Upper chest: Mild stranding in the right upper chest subcutaneous fat. There is also similar stranding in the subcutaneous fat of the chin bilaterally left greater than right. These areas may be due to bruising or cellulitis. No frank hematoma identified. Lung apices clear. Prominent extrapleural fat in the lung apices posteriorly bilaterally. IMPRESSION: The airway is  intact.  No hematoma is identified. Prevertebral soft tissue swelling due to large patient size and medial deviation of the right internal carotid artery causing indentation of the posterior oropharynx. Currently no hematoma. Marked prevertebral soft tissue swelling on 04/23/2016 May have been edema or hematoma which has resolved. Mild stranding in the right anterior chest and bilateral chin may be due to cellulitis or bruising. Electronically Signed   By: Marlan Palau M.D.   On: 04/26/2016 16:08   Dg Chest Port 1 View  05/07/2016  CLINICAL DATA:  Respiratory distress EXAM: PORTABLE CHEST 1 VIEW COMPARISON:  05/05/2016 FINDINGS: Right central line remains in place, unchanged. Stable airspace opacities in the left mid and lower lung. New airspace opacity in the right upper lobe. Findings compatible with multifocal pneumonia. Heart is borderline in size. No effusions or acute bony abnormality. IMPRESSION: Stable patchy left lung airspace disease within the right upper lobe consolidation. Electronically Signed   By: Charlett Nose M.D.   On: 05/07/2016 07:09   Dg Chest Port 1 View  05/05/2016  CLINICAL DATA:  Acute respiratory failure and pneumonia, history of COPD, CHF and MI, acute renal failure. EXAM: PORTABLE CHEST 1 VIEW COMPARISON:  Portable chest x-ray of May 03, 2016. FINDINGS: The lungs are well-expanded. Confluent alveolar opacity in the left mid lung is slightly less conspicuous today. There is  a trace of pleural fluid blunting the left lateral costophrenic angle. The cardiac silhouette is mildly enlarged. The central pulmonary vascularity is prominent. The right internal jugular venous catheter tip projects over the midportion of the SVC. IMPRESSION: Slight interval improvement in the appearance of the airspace opacities in the left mid lung but significant abnormality remains. Stable mild interstitial prominence diffusely. Stable cardiomegaly. Electronically Signed   By: David  [place] M.D.   On: 05/05/2016 07:33   Dg Chest Port 1 View  05/03/2016  CLINICAL DATA:  Followup exam for respiratory failure air. Increased wheezing this morning. History of COPD. EXAM: PORTABLE CHEST 1 VIEW COMPARISON:  05/02/2016 FINDINGS: Air space consolidation, most evident in the left mid lung, is similar to  the previous day's study allowing for differences patient positioning and technique. No new lung abnormalities. No convincing pleural effusion. No pneumothorax. Right internal jugular central venous line is stable with its tip in the mid superior vena cava. IMPRESSION: 1. No significant change from the previous day's study. 2. Persistent airspace lung opacity, greatest on the left, consistent with pneumonia. Electronically Signed   By: Amie Portland M.D.   On: 05/03/2016 09:09   Dg Chest Port 1 View  05/02/2016  CLINICAL DATA:  Acute onset shortness of breath today. COPD with acute exacerbation. Myocardial infarct. Stage 4 chronic kidney disease. EXAM: PORTABLE CHEST 1 VIEW COMPARISON:  04/26/2016 FINDINGS: New asymmetric airspace disease is seen involving the left mid and lower lung, and the right lung base to lesser extent. This may be due to asymmetric edema or infection. No definite evidence pleural effusion. Heart size remains normal. Right jugular central venous catheter remains in appropriate position. No pneumothorax visualized. IMPRESSION: New asymmetric airspace disease involving left lung greater than  right. Differential diagnosis includes asymmetric edema and infection. Electronically Signed   By: Myles Rosenthal M.D.   On: 05/02/2016 14:00   Dg Chest Port 1 View  04/26/2016  CLINICAL DATA:  75 year old female with stridor and wheezing. Initial encounter. EXAM: PORTABLE CHEST 1 VIEW COMPARISON:  0828 hours today and earlier. FINDINGS: Portable AP semi upright view at 1243 hours. Stable right IJ central line, tip at the level of the aortic arch, cephalad SVC. Stable cardiac size and mediastinal contours. No pneumothorax or pulmonary edema. No consolidation. Left lung base ventilation is stable. No new pulmonary opacity. IMPRESSION: Stable ventilation since 0828 hours today. No new cardiopulmonary abnormality. Electronically Signed   By: Odessa Fleming M.D.   On: 04/26/2016 13:20   Dg Chest Port 1 View  04/26/2016  CLINICAL DATA:  Acute respiratory failure with hypoxia EXAM: PORTABLE CHEST - 1 VIEW COMPARISON:  04/28/2016 FINDINGS: Stable right IJ central venous catheter. Lungs are clear. Improved aeration of the left lung base. Heart size and mediastinal contours are within normal limits. No effusion evident. Right shoulder arthroplasty hardware partially visualized. IMPRESSION: Improved aeration at the left lung base. Electronically Signed   By: Corlis Leak M.D.   On: 04/26/2016 08:45   Dg Chest Port 1 View  05/01/2016  CLINICAL DATA:  Respiratory failure. EXAM: PORTABLE CHEST 1 VIEW COMPARISON:  04/07/2016. FINDINGS: Right IJ line in stable position. Mild left base atelectasis and/or infiltrate. Small left pleural effusion. No pneumothorax. Heart size normal. Right shoulder replacement. IMPRESSION: 1.  Right IJ line in stable position. 2. Mild left base atelectasis and/or infiltrate. Small left pleural effusion. No interim change. Electronically Signed   By: Maisie Fus  Register   On: 04/09/2016 07:13    ASSESSMENT AND PLAN  1. Acute on chronic respiratory failure - she is barely managing to maintain oxygen  saturation despite bipap. I will give her more lasix but I think that this is pneumonia. Her white count is up and she has a worsening right sided infiltrate. As she is not anti-coagulated, she is also at risk for PE. I will defer IV heparin to CCM 2. NSTEMi - she denies anginal pain. No additional treatment 3. Neck hematoma - resolving  Gregg Taylor,M.D.  05/07/2016 8:16 AM

## 2016-05-07 NOTE — Progress Notes (Signed)
eLink Physician-Brief Progress Note Patient Name: [name] [name] DOB: 12-03-1941 MRN: [id number]   Date of Service  05/07/2016  HPI/Events of Note  Rise in bp. Unclear cause  eICU Interventions  Lasix x 1 dose     Intervention Category Major Interventions: Hypertension - evaluation and management  Tamrah Victorino 05/07/2016, 6:12 AM

## 2016-05-07 NOTE — Progress Notes (Signed)
Called Elink. O2 Stats low 90's on bipap. MD ordered chest xray and ABG. Will continue to monitor.

## 2016-05-07 NOTE — Procedures (Signed)
Arterial Catheter Insertion Procedure Note [name] [name] [id number] March 07, 1941  Procedure: Insertion of Arterial Catheter  Indications: Blood pressure monitoring and Frequent blood sampling  Procedure Details Consent: Unable to obtain consent because of emergent medical necessity. no family present, pt sedated on ventilator. Time Out: Verified patient identification, verified procedure, site/side was marked, verified correct patient position, special equipment/implants available, medications/allergies/relevent history reviewed, required imaging and test results available.  Performed  Maximum sterile technique was used including antiseptics, cap, gloves, gown, hand hygiene, mask and sheet. Skin prep: Chlorhexidine; local anesthetic administered 20 gauge catheter was inserted into right radial artery using the Seldinger technique.  Evaluation Blood flow good; BP tracing good. Complications: No apparent complications.   Jennette Kettle 05/07/2016

## 2016-05-07 NOTE — Progress Notes (Signed)
eLink Physician-Brief Progress Note Patient Name: [name] [name] DOB: [birth year] MRN: [id number]   Date of Service  05/07/2016  HPI/Events of Note  Multiple issues: 1. ABG on100%/PRVC 16/TV 420/P 5 = 7.22/64/244/27 and 2. Pink frothy sputum from ETT.   eICU Interventions  Will order: 1. Increase PRVC rate to 24 and PEEP to 8. 2. ABG at 5:30 PM. 3. Change Lasix 80 mg IV Q 12 hours to begin now.      Intervention Category Major Interventions: Respiratory failure - evaluation and management  Talynn Lebon Eugene 05/07/2016, 4:23 PM

## 2016-05-07 NOTE — Progress Notes (Signed)
Spoke w/ MD Dr Levada Schilling re: ABG.  NO new vent orders rec'd. Will attempt to slowly wean fio2.    Fio2 weaned to 70%, Sat 98-99% currently.

## 2016-05-07 NOTE — Progress Notes (Addendum)
MD notified of panic ABG results.  Pt appears to have dyssynchrony on the vent. Per MD, he plans on giving pt. Paralytic.  No new vent orders received at this time.

## 2016-05-07 NOTE — Progress Notes (Signed)
Pt requested to be put on bi-pap. RN let respiratory know. Will continue to monitor.

## 2016-05-07 NOTE — Progress Notes (Addendum)
Pharmacy Antibiotic Note  [name] [name] is a 75 y.o. female admitted on 2016/04/28 for NSTEMI also being treated for HCAP. Pharmacy has been consulted for ceftazidime dosing.  Day #6/7 ceftazidime for HCAP. SCr 1.7, CrCl ~30 ml/min. WBC 17.6 afebrile. Last PCT 0.16.   Plan: Ceftazidime 2g IV Q12 to end tomorrow   Height: 5\' 3"  (160 cm) Weight: 227 lb 1.2 oz (103 kg) IBW/kg (Calculated) : 52.4  Temp (24hrs), Avg:97.7 F (36.5 C), Min:97.4 F (36.3 C), Max:98.1 F (36.7 C)   Recent Labs Lab 05/03/16 0335 05/04/16 0305 05/05/16 0305 05/05/16 0332 05/06/16 0230 05/07/16 0200 05/07/16 0203  WBC 20.0* 9.7  --  9.4 9.3 17.6*  --   CREATININE 2.01* 1.94* 1.74*  --  1.78*  --  1.70*    Estimated Creatinine Clearance: 33.8 mL/min (by C-G formula based on Cr of 1.7).    Allergies  Allergen Reactions  . Ciprofloxacin     rash    Antimicrobials this admission: Ceftazidime 5/30 >>   Dose adjustments this admission: Increased to q12h for CrCl ~30 ml/min  Microbiology results: MRSA PCR: negative   Hillery Aldo, [place].D., BCPS PGY2 Pharmacy Resident Pager: [phone] 05/07/2016 10:26 AM   ADDENDUM: Vancomycin added for progressive resp distress requiring intubation. Will start vancomycin 2000 mg x 1 loading dose, then 1250 mg q24h. CrCl~30 ml/min. Trough at steady state, will follow sputum cx results and s/sx clinical improvement.  Hillery Aldo, [place].D., BCPS PGY2 Pharmacy Resident Pager: [phone] 05/07/2016, 12:19 PM

## 2016-05-07 NOTE — Progress Notes (Signed)
Pt desat to 84%, fio2 increased to 100% again.  RN in room and aware.

## 2016-05-08 ENCOUNTER — Inpatient Hospital Stay (HOSPITAL_COMMUNITY): Payer: Medicare Other

## 2016-05-08 ENCOUNTER — Other Ambulatory Visit: Payer: Self-pay | Admitting: Cardiology

## 2016-05-08 LAB — URINE MICROSCOPIC-ADD ON

## 2016-05-08 LAB — MAGNESIUM
MAGNESIUM: 2.7 mg/dL — AB (ref 1.7–2.4)
Magnesium: 2.7 mg/dL — ABNORMAL HIGH (ref 1.7–2.4)

## 2016-05-08 LAB — BLOOD GAS, ARTERIAL
ACID-BASE DEFICIT: 2.5 mmol/L — AB (ref 0.0–2.0)
BICARBONATE: 23.2 meq/L (ref 20.0–24.0)
DRAWN BY: 44956
FIO2: 50
O2 Saturation: 93.8 %
PEEP/CPAP: 8 cmH2O
Patient temperature: 98.6
RATE: 24 resp/min
TCO2: 24.7 mmol/L (ref 0–100)
VT: 420 mL
pCO2 arterial: 49.9 mmHg — ABNORMAL HIGH (ref 35.0–45.0)
pH, Arterial: 7.289 — ABNORMAL LOW (ref 7.350–7.450)
pO2, Arterial: 72.6 mmHg — ABNORMAL LOW (ref 80.0–100.0)

## 2016-05-08 LAB — URINALYSIS, ROUTINE W REFLEX MICROSCOPIC
BILIRUBIN URINE: NEGATIVE
GLUCOSE, UA: NEGATIVE mg/dL
KETONES UR: 15 mg/dL — AB
Nitrite: NEGATIVE
PROTEIN: NEGATIVE mg/dL
Specific Gravity, Urine: 1.023 (ref 1.005–1.030)
pH: 5 (ref 5.0–8.0)

## 2016-05-08 LAB — GLUCOSE, CAPILLARY
GLUCOSE-CAPILLARY: 175 mg/dL — AB (ref 65–99)
GLUCOSE-CAPILLARY: 233 mg/dL — AB (ref 65–99)
Glucose-Capillary: 201 mg/dL — ABNORMAL HIGH (ref 65–99)
Glucose-Capillary: 189 mg/dL — ABNORMAL HIGH (ref 65–99)
Glucose-Capillary: 211 mg/dL — ABNORMAL HIGH (ref 65–99)
Glucose-Capillary: 146 mg/dL — ABNORMAL HIGH (ref 65–99)

## 2016-05-08 LAB — CBC
HEMATOCRIT: 20.4 % — AB (ref 36.0–46.0)
HEMATOCRIT: 22 % — AB (ref 36.0–46.0)
HEMOGLOBIN: 7.2 g/dL — AB (ref 12.0–15.0)
Hemoglobin: 6.5 g/dL — CL (ref 12.0–15.0)
MCH: 32.8 pg (ref 26.0–34.0)
MCH: 32.9 pg (ref 26.0–34.0)
MCHC: 31.9 g/dL (ref 30.0–36.0)
MCHC: 32.7 g/dL (ref 30.0–36.0)
MCV: 103 fL — AB (ref 78.0–100.0)
MCV: 100.5 fL — ABNORMAL HIGH (ref 78.0–100.0)
Platelets: 54 10*3/uL — ABNORMAL LOW (ref 150–400)
Platelets: 52 10*3/uL — ABNORMAL LOW (ref 150–400)
RBC: 1.98 MIL/uL — AB (ref 3.87–5.11)
RBC: 2.19 MIL/uL — ABNORMAL LOW (ref 3.87–5.11)
RDW: 14.3 % (ref 11.5–15.5)
RDW: 14.2 % (ref 11.5–15.5)
WBC: 11.8 10*3/uL — AB (ref 4.0–10.5)
WBC: 9.1 10*3/uL (ref 4.0–10.5)

## 2016-05-08 LAB — BASIC METABOLIC PANEL
ANION GAP: 9 (ref 5–15)
BUN: 86 mg/dL — ABNORMAL HIGH (ref 6–20)
CALCIUM: 8 mg/dL — AB (ref 8.9–10.3)
CO2: 24 mmol/L (ref 22–32)
Chloride: 103 mmol/L (ref 101–111)
Creatinine, Ser: 2.58 mg/dL — ABNORMAL HIGH (ref 0.44–1.00)
GFR, EST AFRICAN AMERICAN: 20 mL/min — AB (ref >60)
GFR, EST NON AFRICAN AMERICAN: 17 mL/min — AB (ref >60)
GLUCOSE: 171 mg/dL — AB (ref 65–99)
POTASSIUM: 4.8 mmol/L (ref 3.5–5.1)
Sodium: 136 mmol/L (ref 135–145)

## 2016-05-08 LAB — PHOSPHORUS
PHOSPHORUS: 5.4 mg/dL — AB (ref 2.5–4.6)
Phosphorus: 5.5 mg/dL — ABNORMAL HIGH (ref 2.5–4.6)

## 2016-05-08 LAB — CREATININE, URINE, RANDOM: Creatinine, Urine: 105.85 mg/dL

## 2016-05-08 LAB — SODIUM, URINE, RANDOM: Sodium, Ur: <10 mmol/L

## 2016-05-08 MED ORDER — BUDESONIDE 0.25 MG/2ML IN SUSP
0.2500 mg | Freq: Four times a day (QID) | RESPIRATORY_TRACT | Status: DC
  Administered 2016-05-08 – 2016-05-13 (×21): 0.25 mg via RESPIRATORY_TRACT
  Filled 2016-05-08 (×21): qty 2

## 2016-05-08 MED ORDER — ARFORMOTEROL TARTRATE 15 MCG/2ML IN NEBU
15.0000 ug | INHALATION_SOLUTION | Freq: Two times a day (BID) | RESPIRATORY_TRACT | Status: DC
  Administered 2016-05-08 – 2016-05-13 (×11): 15 ug via RESPIRATORY_TRACT
  Filled 2016-05-08 (×11): qty 2

## 2016-05-08 MED ORDER — PRO-STAT SUGAR FREE PO LIQD
60.0000 mL | Freq: Four times a day (QID) | ORAL | Status: DC
  Administered 2016-05-08 – 2016-05-10 (×11): 60 mL
  Filled 2016-05-08 (×11): qty 60

## 2016-05-08 MED ORDER — DEXTROSE 5 % IV SOLN
2.0000 g | INTRAVENOUS | Status: DC
  Administered 2016-05-09 – 2016-05-10 (×2): 2 g via INTRAVENOUS
  Filled 2016-05-08 (×2): qty 2

## 2016-05-08 MED ORDER — FUROSEMIDE 10 MG/ML IJ SOLN
160.0000 mg | Freq: Three times a day (TID) | INTRAVENOUS | Status: DC
  Administered 2016-05-08 – 2016-05-11 (×9): 160 mg via INTRAVENOUS
  Filled 2016-05-08 (×12): qty 16

## 2016-05-08 MED ORDER — VITAL HIGH PROTEIN PO LIQD
1000.0000 mL | ORAL | Status: DC
  Administered 2016-05-09 (×2): 1000 mL
  Administered 2016-05-09: 08:00:00
  Administered 2016-05-10: 1000 mL

## 2016-05-08 MED ORDER — DEXTROSE 5 % IV SOLN
2.0000 g | Freq: Two times a day (BID) | INTRAVENOUS | Status: DC
  Filled 2016-05-08: qty 2

## 2016-05-08 NOTE — Progress Notes (Signed)
Pharmacy Antibiotic Note  [name] [name] is a 75 y.o. female admitted on May 01, 2016 for NSTEMI also being treated for HCAP. Pharmacy has been consulted for ceftazidime and vancomycin dosing. Still with significant respiratory distress, so vancomycin was added and LOT of ceftazidime extended.   Plan: -Ceftazidime 2g IV Q24h with decline in renal function -Continue vancomycin 1250mg  IV q24h- will need to change if renal function continues to worsen -follow clinical progression, c/s, renal function, trough PRN, LOT   Height: 5\' 3"  (160 cm) Weight: 230 lb 2.6 oz (104.4 kg) IBW/kg (Calculated) : 52.4  Temp (24hrs), Avg:97.3 F (36.3 C), Min:95.5 F (35.3 C), Max:99 F (37.2 C)   Recent Labs Lab 05/04/16 0305 05/05/16 0305 05/05/16 0332 05/06/16 0230 05/07/16 0200 05/07/16 0203 05/08/16 0405 05/08/16 0650  WBC 9.7  --  9.4 9.3 17.6*  --  11.8* 9.1  CREATININE 1.94* 1.74*  --  1.78*  --  1.70* 2.58*  --     Estimated Creatinine Clearance: 22.4 mL/min (by C-G formula based on Cr of 2.58).    Allergies  Allergen Reactions  . Ciprofloxacin     rash    Antimicrobials this admission: Ceftazidime 5/30 >>  Vancomycin 6/4>>  Dose adjustments this admission: 6/4: Ceftaz Increased to q12h for CrCl ~30 ml/min 6/5: Ceftaz decreased to q24h for CrCl ~22  Microbiology results: MRSA PCR: negative 5/30 urine: mult species 6/4 sputum: reincubated  [name] [name], PharmD, BCPS Clinical Pharmacist Pager: [phone] 05/08/2016 12:22 PM

## 2016-05-08 NOTE — Progress Notes (Signed)
eLink Physician-Brief Progress Note Patient Name: [name] [name] DOB: [birth year] MRN: [id number]   Date of Service  05/08/2016  HPI/Events of Note  Hb 6.5 from 9.3 the day before. (-) active bleeding.  150/55, 78, 20  100%  eICU Interventions  Recheck CBC now.      Intervention Category Evaluation Type: Other  Jose Bridgette Habermann Dios 05/08/2016, 6:53 AM

## 2016-05-08 NOTE — Care Management Important Message (Signed)
Important Message  Patient Details  Name: [name] [name] MRN: [id number] Date of Birth: 11-23-1941   Medicare Important Message Given:  Yes    Bernadette Hoit 05/08/2016, 11:41 AM

## 2016-05-08 NOTE — Progress Notes (Signed)
Pt. Hemoglobin 6.5 at 6:45.  MD notified.  Wants to recheck CBC. Will continue to monitor.

## 2016-05-08 NOTE — Progress Notes (Signed)
Inpatient Rehabilitation  Pt. Intubated on 05/07/16.  Dr. Craige Cotta has cancelled IP rehab order.  I will sign off .  Please call if questions.  Weldon Picking PT Inpatient Rehab Admissions Coordinator Cell [phone] Office [phone]

## 2016-05-08 NOTE — Progress Notes (Signed)
PULMONARY / CRITICAL CARE MEDICINE   Name: [name] [name] MRN: [id number] DOB: 05-Jul-1941    ADMISSION DATE:  04/30/2016 CONSULTATION DATE:  04/23/2016  REFERRING MD:  Viann Fish, Teodora Medici  CHIEF COMPLAINT:  Can't breath  Brief Profile  75 yo obese wf quit smoking one year pta  admitted with NSTEMI, HTN urgency, and AECOPD. She has hx of CKD and difficulty IV access. She had Rt IJ CVL placed 5/19 >> developed Rt neck hematoma with airway compromise and transferred to ICU. She has been transferred to SDU as her respiratory status has improved.   SUBJECTIVE:  No events overnight.  VITAL SIGNS: BP 139/42 mmHg  Pulse 84  Temp(Src) 98.6 F (37 C) (Core (Comment))  Resp 24  Ht 5\' 3"  (1.6 m)  Wt 104.4 kg (230 lb 2.6 oz)  BMI 40.78 kg/m2  SpO2 91%   INTAKE / OUTPUT: I/O last 3 completed shifts: In: 3827.1 [I.V.:2367.1; NG/GT:860; IV Piggyback:600] Out: 580 [Urine:580]  PHYSICAL EXAMINATION: General: Sedated and paralyzed. Neuro: Unable to examine due to paralytics. HEENT: ecchymosis around neck and upper chest, ETT ok. Cardiac: RRR, Nl S1/S2, -M/R/G. Chest: Coarse BS diffusely. Abd: Soft, NT, ND and +BS. Ext: 2+ edema Skin: Multiple areas of ecchymosis  LABS:  BMET  Recent Labs Lab 05/06/16 0230 05/07/16 0203 05/08/16 0405  NA 137 139 136  K 4.4 4.7 4.8  CL 100* 102 103  CO2 27 27 24   BUN 67* 67* 86*  CREATININE 1.78* 1.70* 2.58*  GLUCOSE 167* 97 171*   Electrolytes  Recent Labs Lab 05/06/16 0230 05/07/16 0200 05/07/16 0203 05/07/16 1245 05/08/16 0405  CALCIUM 8.7*  --  9.0  --  8.0*  MG 3.0* 2.9*  --  2.9* 2.7*  PHOS 3.9  --  3.9 5.4* 5.4*   CBC  Recent Labs Lab 05/07/16 0200 05/08/16 0405 05/08/16 0650  WBC 17.6* 11.8* 9.1  HGB 9.3* 6.5* 7.2*  HCT 28.8* 20.4* 22.0*  PLT 75* 54* 52*   Coag's No results for input(s): APTT, INR in the last 168 hours.  Sepsis Markers  Recent Labs Lab 05/03/16 1006 05/04/16 0305 05/05/16 0332   PROCALCITON 0.30 0.26 0.16   ABG  Recent Labs Lab 05/07/16 1559 05/07/16 1758 05/08/16 0352  PHART 7.223* 7.301* 7.289*  PCO2ART 63.8* 47.6* 49.9*  PO2ART 244.0* 103.0* 72.6*   Liver Enzymes  Recent Labs Lab 05/05/16 0305 05/06/16 0230 05/07/16 0203  ALBUMIN 2.9* 2.7* 2.9*   Cardiac Enzymes  Recent Labs Lab 05/01/16 2245 05/02/16 0435 05/02/16 1000  TROPONINI 0.23* 0.26* 0.36*   Glucose  Recent Labs Lab 05/07/16 0926 05/07/16 1251 05/07/16 1714 05/07/16 2105 05/08/16 0006 05/08/16 0757  GLUCAP 119* 128* 153* 186* 201* 189*   Imaging Dg Chest Port 1 View  05/08/2016  CLINICAL DATA:  Respiratory failure with pneumonia, history of COPD, previous MI, CHF, acute and chronic renal failure. EXAM: PORTABLE CHEST 1 VIEW COMPARISON:  Portable chest x-ray of May 07, 2016 FINDINGS: There is persistent alveolar opacity in the right upper lobe with lesser amounts in both lower lobes. The left hemidiaphragm remains partially obscured. The cardiac silhouette is enlarged but stable. The pulmonary vascularity is mildly prominent centrally. There is no large pleural effusion. There is no pneumothorax. The endotracheal tube tip lies 2.7 cm above the carina. The esophagogastric tube tip projects below the inferior margin of the image. The right internal jugular venous catheter tip projects over the junction of the proximal and midportions of the SVC. IMPRESSION:  Slight interval deterioration in the appearance of the left lower lung where there is infiltrate or atelectasis and small pleural effusion. Slight increased density at the right lung base without significant pleural effusion. Persistent alveolar opacities in the right upper lobe compatible with pneumonia. Electronically Signed   By: David  [place] M.D.   On: 05/08/2016 07:15   Dg Chest Port 1 View  05/07/2016  CLINICAL DATA:  Status post intubation EXAM: PORTABLE CHEST 1 VIEW COMPARISON:  05/07/2016 FINDINGS: There is a ET tube with  tip above the carina. Right IJ catheter tip is in the projection of the SVC. There is a nasogastric tube with tip below the field of view. Bilateral upper lobe and left lower lobe airspace opacities are unchanged from previous exam. IMPRESSION: 1. No change in bilateral airspace opacities compatible with multifocal infection. 2. ET tube tip is in satisfactory position above the carina. Electronically Signed   By: Signa Kell M.D.   On: 05/07/2016 13:41   STUDIES:  5/19 Echo >> mod LVH, EF 65 to 70%, grade 1 diastolic CHF 5/24 X-ray Neck >> Regressed density & stranding in neck. 5/24 CT neck w/o >> Airway in tact. No hematoma. Indentation of posterior pharynx by anatomy. Mild stranding right anterior chest & bilateral chin could be due to cellulitis or bruising.  MICROBIOLOGY: MRSA PCR 5/19:  Negative   ANTIBIOTICS: Ceftaz 5/30 >>  Vancomycin start date unknown>>>  SIGNIFICANT EVENTS: 5/19 Admit, cardiology consulted 5/21 Transfer to ICU with respiratory distress from Rt neck hematoma 5/22 Transferred back to SDU, tolerating  5/24- worseing status resp failure, back to ICU 5/26- Improving status transferred to SDU 5/30 transferred back to ICU 2/2 "resp distress/anxiety". Better on Bipap. 6/4 reintubated for hypoxemic respiratory failure and paralyzed for asynchrony.  LINES/TUBES: 5/19 Rt IJ CVL >>  ASSESSMENT / PLAN:  PULMONARY A: Acute Hypoxemic Hypercapnic Resp Fx 2/2 Exacerbation of COPD, HCAP bibasilar (L > R), VCD, likely with OSA (undiagnosed) P:   - Increase RR to 28 for respiratory acidosis. - Titrate FiO2 to keep SpO2 > 92% - Breo d/c'ed 5/27 due to hoarseness/ upper airway cough. - Duoneb PRN. - Pulmicort BID, brovana bid. - Continue solumedrol 40 mg IV q6 for now. - Continue abx.  CARDIOVASCULAR A:  NSTEMI - Cath currently on hold. Hypertensive Urgency - BP better Acute on Chronic Diastolic CHF H/O HTN P:  - Holding ASA, heparin gtt in setting of hematoma  >> might be able to resume soon - Lasix 80 mg IV BID, continue for now until evaluated by renal. - Continue amlodipine, lipitor, hydralazine, imdur - Defer timing of cardiac cath to cardiology  RENAL A:   Acute on Chronic Renal Failure Stage IV - Creatinine worsening. P:   - Monitoring electrolytes & renal function daily - Replace electrolytes as indicated. - Renal consult called. - KVO IVF. - Follow CVP.  GASTROINTESTINAL A:   H/O GERD Questionable Aspiration P:   - TF per nutrition. - Protonix bid ac    HEMATOLOGIC A:   Neck Hematoma - Resolving. Anemia - Mild. No obvious ongoing bleeding, HGB 10.3 ( 5/30) Thrombocytopenia P:  - Trending cell counts daily w/ CBC - SCDs - Check HIT panel, not anticoagulation given hematoma and thrombocytopenia.  INFECTIOUS A:   CXR with Bibasilar infiltrates, L > R. Likely HCAP P:   - Day 7/14 fortaz - Vanc - F/U on cultures, NTD  ENDOCRINE A:   Steroid induced hyperglycemia - BG controlled.  P:   -  Accu-Checks q4hr - SSI per resistant algorithm  NEUROLOGIC A:   Anxiety D/O Sedate on vent H/O Depression  P: - Propofol - Fentanyl drip. - Vecuronium drip. - Hold further sedating medications for now, may need klonipin prior to serious weaning attempts due to high anxiety levels.  Daughter and grand daughter updated at length bedside.  Living will reviewed, unfortunately too generic, family is ok with HD for now.  Daughter is extremely anxious.  The patient is critically ill with multiple organ systems failure and requires high complexity decision making for assessment and support, frequent evaluation and titration of therapies, application of advanced monitoring technologies and extensive interpretation of multiple databases.   Critical Care Time devoted to patient care services described in this note is  35  Minutes. This time reflects time of care of this signee Dr Koren Bound. This critical care time does not reflect  procedure time, or teaching time or supervisory time of PA/NP/Med student/Med Resident etc but could involve care discussion time.  Alyson Reedy, M.D. [facility] Pulmonary/Critical Care Medicine. Pager: [phone]. After hours pager: [phone].

## 2016-05-08 NOTE — Progress Notes (Signed)
Initial Nutrition Assessment  DOCUMENTATION CODES:   Morbid obesity  INTERVENTION:   Decrease Vital High Protein to 10 ml/hr Increase Prostat to 60 ml Prostat QID Provides: 1040 kcal, 141 grams protein, and 200 ml H2O.  TF regimen and propofol at current rate providing 1449 total kcal/day   NUTRITION DIAGNOSIS:   Inadequate oral intake related to inability to eat as evidenced by NPO status.  GOAL:   Provide needs based on ASPEN/SCCM guidelines  MONITOR:   I & O's, Skin, Vent status, TF tolerance  REASON FOR ASSESSMENT:   Consult Enteral/tube feeding initiation and management  ASSESSMENT:   Pt admitted with NSTEMI, HTN urgency, and AECOPD. She has hx of CKD and difficulty IV access. She had Rt IJ CVL placed 5/19 >> developed Rt neck hematoma with airway compromise and transferred to ICU.   6/4 reintubated for hypoxemic respiratory failure and paralyzed for asynchrony. Renal consult called for consideration of HD.   MV: 9.8 L/min Temp (24hrs), Avg:97.3 F (36.3 C), Min:95.5 F (35.3 C), Max:99 F (37.2 C)  Propofol: 15.5 ml/hr provides: 409 kcal per day from lipid Medications reviewed and include: solumedrol, protonix Labs reviewed: BUN/Cr 86/2.58, PO4 elevated 5.4, Magnesium elevated 2.7 CBG's: 175-201 OG tube Meal Completion throughout stay has been documented below 75% for most meals.  Discussed plan with RN.   Diet Order:  Diet NPO time specified  Skin:  Wound (see comment) (MASD perineum, skin tear sacrum)  Last BM:  6/3  Height:   Ht Readings from Last 1 Encounters:  05/07/16 5\' 3"  (1.6 m)    Weight:   Wt Readings from Last 1 Encounters:  05/08/16 230 lb 2.6 oz (104.4 kg)    Ideal Body Weight:     BMI:  Body mass index is 40.78 kg/(m^2).  Estimated Nutritional Needs:   Kcal:  1150-1305  Protein:  >/= 130 grams  Fluid:  >1.5 L/day  EDUCATION NEEDS:   No education needs identified at this time  Kendell Bane RD, LDN,  CNSC [phone] Pager [phone] After Hours Pager

## 2016-05-08 NOTE — Consult Note (Signed)
[name] [name] is an 75 y.o. female referred by Dr Molli Knock   Chief Complaint: Acute on CKD 3 HPI: 75 yo WF with CKD 3 and baseline Scr around 2 admitted 04/28/16 for SOB due to exacerbation of COPD, HTN and angina.  Complication of hematoma from central line placement causing increased resp distress requiring intubation.  Scr here has been in the upper 1's-2 range until today when Scr increased to 2.58.  There is one BP recorded yest of 82/50 and several SBP in the low 100's. UO had been >1/d until 6/3-6/4 when it was only 545cc and yest 355.  BP better now.   Past Medical History  Diagnosis Date  . Hypertension   . History of blood clots     Right leg & lung  . High cholesterol   . COPD (chronic obstructive pulmonary disease) (HCC)   . Asthma   . GERD (gastroesophageal reflux disease)   . PONV (postoperative nausea and vomiting)   . MI (myocardial infarction) (HCC)     X 2  . Heart murmur   . Shortness of breath   . Stroke (HCC)   . Anxiety   . Depression   . Difficulty swallowing solids   . Stomach ulcer   . Gout   . History of shingles   . Rash     MD aware and pt was given Monistat to use; on stomach, neck (hairline)  . Bruises easily   . Renal disorder     Past Surgical History  Procedure Laterality Date  . Abdominal hysterectomy    . Breast surgery Left   . Appendectomy    . Colonoscopy w/ polypectomy    . Upper gi endoscopy    . Esophagus surgery      Esophagus stretched during endoscopy  . Ivc filter    . Cataract extraction, bilateral Bilateral   . Shoulder hemi-arthroplasty Right 04/16/2015    Procedure: RIGHT SHOULDER HEMI-ARTHROPLASTY WITH BIOPSY;  Surgeon: Beverely Low, MD;  Location: [facility];  Service: Orthopedics;  Laterality: Right;    Family History  Problem Relation Age of Onset  . Heart attack Father     in his 80s  . Congestive Heart Failure Mother    Social History:  reports that she quit smoking about 3 years ago. Her smoking use included Cigarettes.  She has a 32.5 pack-year smoking history. She does not have any smokeless tobacco history on file. She reports that she does not drink alcohol or use illicit drugs.  Allergies:  Allergies  Allergen Reactions  . Ciprofloxacin     rash    Medications Prior to Admission  Medication Sig Dispense Refill  . aspirin EC 81 MG EC tablet Take 1 tablet (81 mg total) by mouth daily.    Marland Kitchen atorvastatin (LIPITOR) 40 MG tablet Take 40 mg by mouth daily at 6 PM.     . bisoprolol (ZEBETA) 10 MG tablet Take one tablet daily  2  . BREO ELLIPTA 200-25 MCG/INH AEPB Inhale 1 puff into the lungs daily.     . calcitRIOL (ROCALTROL) 0.25 MCG capsule Take 2 capsules by mouth daily.     Marland Kitchen doxazosin (CARDURA) 4 MG tablet Take 1 tablet by mouth daily.    . ergocalciferol (VITAMIN D2) 50000 units capsule Take 50,000 Units by mouth once a week.    . fluticasone (CUTIVATE) 0.05 % cream Apply 1 application topically 2 (two) times daily.    . furosemide (LASIX) 80 MG tablet Take 80  mg by mouth daily.     . isosorbide mononitrate (IMDUR) 30 MG 24 hr tablet Take 3 tablets (90 mg total) by mouth daily. 90 tablet 3  . montelukast (SINGULAIR) 10 MG tablet Take 10 mg by mouth at bedtime.    . nitroGLYCERIN (NITROSTAT) 0.4 MG SL tablet Place 0.4 mg under the tongue every 5 (five) minutes as needed. Chest pain  0  . omeprazole (PRILOSEC) 20 MG capsule Take 20 mg by mouth daily.    Marland Kitchen spironolactone (ALDACTONE) 25 MG tablet Take 25 mg by mouth daily.    . traZODone (DESYREL) 50 MG tablet Take 50 mg by mouth at bedtime.       Lab Results: UA: neg protein, 6-30 wbc, 6-30 RBC   Recent Labs  05/07/16 0200 05/08/16 0405 05/08/16 0650  WBC 17.6* 11.8* 9.1  HGB 9.3* 6.5* 7.2*  HCT 28.8* 20.4* 22.0*  PLT 75* 54* 52*   BMET  Recent Labs  05/06/16 0230 05/07/16 0203 05/07/16 1245 05/08/16 0405  NA 137 139  --  136  K 4.4 4.7  --  4.8  CL 100* 102  --  103  CO2 27 27  --  24  GLUCOSE 167* 97  --  171*  BUN 67* 67*   --  86*  CREATININE 1.78* 1.70*  --  2.58*  CALCIUM 8.7* 9.0  --  8.0*  PHOS 3.9 3.9 5.4* 5.4*   LFT  Recent Labs  05/07/16 0203  ALBUMIN 2.9*   Dg Chest Port 1 View  05/08/2016  CLINICAL DATA:  Respiratory failure with pneumonia, history of COPD, previous MI, CHF, acute and chronic renal failure. EXAM: PORTABLE CHEST 1 VIEW COMPARISON:  Portable chest x-ray of May 07, 2016 FINDINGS: There is persistent alveolar opacity in the right upper lobe with lesser amounts in both lower lobes. The left hemidiaphragm remains partially obscured. The cardiac silhouette is enlarged but stable. The pulmonary vascularity is mildly prominent centrally. There is no large pleural effusion. There is no pneumothorax. The endotracheal tube tip lies 2.7 cm above the carina. The esophagogastric tube tip projects below the inferior margin of the image. The right internal jugular venous catheter tip projects over the junction of the proximal and midportions of the SVC. IMPRESSION: Slight interval deterioration in the appearance of the left lower lung where there is infiltrate or atelectasis and small pleural effusion. Slight increased density at the right lung base without significant pleural effusion. Persistent alveolar opacities in the right upper lobe compatible with pneumonia. Electronically Signed   By: David  [place] M.D.   On: 05/08/2016 07:15   Dg Chest Port 1 View  05/07/2016  CLINICAL DATA:  Status post intubation EXAM: PORTABLE CHEST 1 VIEW COMPARISON:  05/07/2016 FINDINGS: There is a ET tube with tip above the carina. Right IJ catheter tip is in the projection of the SVC. There is a nasogastric tube with tip below the field of view. Bilateral upper lobe and left lower lobe airspace opacities are unchanged from previous exam. IMPRESSION: 1. No change in bilateral airspace opacities compatible with multifocal infection. 2. ET tube tip is in satisfactory position above the carina. Electronically Signed   By: Signa Kell M.D.   On: 05/07/2016 13:41   Dg Chest Port 1 View  05/07/2016  CLINICAL DATA:  Respiratory distress EXAM: PORTABLE CHEST 1 VIEW COMPARISON:  05/05/2016 FINDINGS: Right central line remains in place, unchanged. Stable airspace opacities in the left mid and lower lung. New airspace  opacity in the right upper lobe. Findings compatible with multifocal pneumonia. Heart is borderline in size. No effusions or acute bony abnormality. IMPRESSION: Stable patchy left lung airspace disease within the right upper lobe consolidation. Electronically Signed   By: Charlett Nose M.D.   On: 05/07/2016 07:09    ROS: Pt intubated  PHYSICAL EXAM: Blood pressure 148/48, pulse 85, temperature 99 F (37.2 C), temperature source Core (Comment), resp. rate 28, height 5\' 3"  (1.6 m), weight 104.4 kg (230 lb 2.6 oz), SpO2 93 %. HEENT: Intubated NECK:Rt triple lumen cath LUNGS:basilar crackles CARDIAC:RRR 2/6 systolic M ABD:+ BS ND, No HSM EXT:1+ edema of legs and arms NEURO:Sedated/paralyed  Assessment: 1. Acute on CKD 3 most likely hemodynamically mediated as it occurred after complication from triple lumen placement with documented lower BP that usual 2. CKD 3 sec HTN 3. COPD 4. ? PNA 5. NSTEMI PLAN: 1. Increase dose of lasix to stimulate UO 2. Daily Scr.  Discussed with daughter that if renal fx cont to worsen then may need HD in few days 3. Hold BP meds for SBP <120   Elena Cothern T 05/08/2016, 12:25 PM

## 2016-05-09 ENCOUNTER — Inpatient Hospital Stay (HOSPITAL_COMMUNITY): Payer: Medicare Other

## 2016-05-09 LAB — GLUCOSE, CAPILLARY
GLUCOSE-CAPILLARY: 162 mg/dL — AB (ref 65–99)
GLUCOSE-CAPILLARY: 187 mg/dL — AB (ref 65–99)
Glucose-Capillary: 190 mg/dL — ABNORMAL HIGH (ref 65–99)
Glucose-Capillary: 195 mg/dL — ABNORMAL HIGH (ref 65–99)
Glucose-Capillary: 208 mg/dL — ABNORMAL HIGH (ref 65–99)
Glucose-Capillary: 211 mg/dL — ABNORMAL HIGH (ref 65–99)

## 2016-05-09 LAB — CULTURE, RESPIRATORY W GRAM STAIN: Culture: NORMAL

## 2016-05-09 LAB — CBC
HCT: 20.8 % — ABNORMAL LOW (ref 36.0–46.0)
HEMATOCRIT: 25.6 % — AB (ref 36.0–46.0)
HEMOGLOBIN: 8.3 g/dL — AB (ref 12.0–15.0)
Hemoglobin: 6.7 g/dL — CL (ref 12.0–15.0)
MCH: 32.7 pg (ref 26.0–34.0)
MCH: 31.9 pg (ref 26.0–34.0)
MCHC: 32.2 g/dL (ref 30.0–36.0)
MCHC: 32.4 g/dL (ref 30.0–36.0)
MCV: 101.5 fL — AB (ref 78.0–100.0)
MCV: 98.5 fL (ref 78.0–100.0)
PLATELETS: 50 10*3/uL — AB (ref 150–400)
Platelets: 52 10*3/uL — ABNORMAL LOW (ref 150–400)
RBC: 2.05 MIL/uL — AB (ref 3.87–5.11)
RBC: 2.6 MIL/uL — ABNORMAL LOW (ref 3.87–5.11)
RDW: 14.1 % (ref 11.5–15.5)
RDW: 16.4 % — AB (ref 11.5–15.5)
WBC: 8 10*3/uL (ref 4.0–10.5)
WBC: 9.6 10*3/uL (ref 4.0–10.5)

## 2016-05-09 LAB — BLOOD GAS, ARTERIAL
Acid-base deficit: 4.7 mmol/L — ABNORMAL HIGH (ref 0.0–2.0)
BICARBONATE: 20.9 meq/L (ref 20.0–24.0)
Drawn by: 39898
FIO2: 0.5
LHR: 28 {breaths}/min
O2 Saturation: 96 %
PEEP: 8 cmH2O
Patient temperature: 98.6
TCO2: 22.3 mmol/L (ref 0–100)
VT: 420 mL
pCO2 arterial: 45.5 mmHg — ABNORMAL HIGH (ref 35.0–45.0)
pH, Arterial: 7.284 — ABNORMAL LOW (ref 7.350–7.450)
pO2, Arterial: 84 mmHg (ref 80.0–100.0)

## 2016-05-09 LAB — BASIC METABOLIC PANEL
ANION GAP: 11 (ref 5–15)
BUN: 111 mg/dL — ABNORMAL HIGH (ref 6–20)
CO2: 19 mmol/L — ABNORMAL LOW (ref 22–32)
Calcium: 7.1 mg/dL — ABNORMAL LOW (ref 8.9–10.3)
Chloride: 106 mmol/L (ref 101–111)
Creatinine, Ser: 2.9 mg/dL — ABNORMAL HIGH (ref 0.44–1.00)
GFR calc Af Amer: 17 mL/min — ABNORMAL LOW (ref >60)
GFR, EST NON AFRICAN AMERICAN: 15 mL/min — AB (ref >60)
Glucose, Bld: 156 mg/dL — ABNORMAL HIGH (ref 65–99)
POTASSIUM: 4.5 mmol/L (ref 3.5–5.1)
SODIUM: 136 mmol/L (ref 135–145)

## 2016-05-09 LAB — MAGNESIUM: MAGNESIUM: 2.5 mg/dL — AB (ref 1.7–2.4)

## 2016-05-09 LAB — HEPARIN INDUCED PLATELET AB (HIT ANTIBODY): Heparin Induced Plt Ab: 0.136 OD (ref 0.000–0.400)

## 2016-05-09 LAB — CULTURE, RESPIRATORY

## 2016-05-09 LAB — PHOSPHORUS: Phosphorus: 5.3 mg/dL — ABNORMAL HIGH (ref 2.5–4.6)

## 2016-05-09 LAB — PREPARE RBC (CROSSMATCH)

## 2016-05-09 MED ORDER — SODIUM CHLORIDE 0.9 % IV SOLN
Freq: Once | INTRAVENOUS | Status: AC
  Administered 2016-05-09: 06:00:00 via INTRAVENOUS

## 2016-05-09 MED ORDER — DEXMEDETOMIDINE HCL IN NACL 200 MCG/50ML IV SOLN
0.4000 ug/kg/h | INTRAVENOUS | Status: DC
  Administered 2016-05-09 (×4): 0.4 ug/kg/h via INTRAVENOUS
  Filled 2016-05-09 (×5): qty 50

## 2016-05-09 MED ORDER — VANCOMYCIN HCL 10 G IV SOLR
1500.0000 mg | INTRAVENOUS | Status: DC
  Filled 2016-05-09: qty 1500

## 2016-05-09 MED ORDER — SODIUM CHLORIDE 0.9 % IV SOLN: Freq: Once | INTRAVENOUS | Status: AC

## 2016-05-09 NOTE — Progress Notes (Signed)
Pt. Hemoglobin 6.7 this am.  MD made aware.  1 unit PRBC given.  Will continue to monitor.

## 2016-05-09 NOTE — Progress Notes (Signed)
PULMONARY / CRITICAL CARE MEDICINE   Name: [name] [name] MRN: [id number] DOB: 23-Jan-1941    ADMISSION DATE:  04/04/2016 CONSULTATION DATE:  04/23/2016  REFERRING MD:  Viann Fish, Teodora Medici  CHIEF COMPLAINT:  Can't breath  Brief Profile  75 yo obese wf quit smoking one year pta  admitted with NSTEMI, HTN urgency, and AECOPD. She has hx of CKD and difficulty IV access. She had Rt IJ CVL placed 5/19 >> developed Rt neck hematoma with airway compromise and transferred to ICU. She has been transferred to SDU as her respiratory status has improved.   SUBJECTIVE:  Hg dropped overnight requiring transfusion.  VITAL SIGNS: BP 159/49 mmHg  Pulse 77  Temp(Src) 95.9 F (35.5 C) (Core (Comment))  Resp 28  Ht 5\' 3"  (1.6 m)  Wt 107.8 kg (237 lb 10.5 oz)  BMI 42.11 kg/m2  SpO2 94%   INTAKE / OUTPUT: I/O last 3 completed shifts: In: 5132.5 [P.O.:300; I.V.:2645.5; Blood:315; NG/GT:1390; IV Piggyback:482] Out: 315 [Urine:315]  PHYSICAL EXAMINATION: General: Sedated and paralyzed. Neuro: Unable to examine due to paralytics. HEENT: ecchymosis around neck and upper chest, ETT ok. Cardiac: RRR, Nl S1/S2, -M/R/G. Chest: Coarse BS diffusely. Abd: Soft, NT, ND and +BS. Ext: 2+ edema Skin: Multiple areas of ecchymosis  LABS:  BMET  Recent Labs Lab 05/07/16 0203 05/08/16 0405 05/09/16 0300  NA 139 136 136  K 4.7 4.8 4.5  CL 102 103 106  CO2 27 24 19*  BUN 67* 86* 111*  CREATININE 1.70* 2.58* 2.90*  GLUCOSE 97 171* 156*   Electrolytes  Recent Labs Lab 05/07/16 0203  05/08/16 0405 05/08/16 1730 05/09/16 0300  CALCIUM 9.0  --  8.0*  --  7.1*  MG  --   < > 2.7* 2.7* 2.5*  PHOS 3.9  < > 5.4* 5.5* 5.3*  < > = values in this interval not displayed. CBC  Recent Labs Lab 05/08/16 0405 05/08/16 0650 05/09/16 0300  WBC 11.8* 9.1 8.0  HGB 6.5* 7.2* 6.7*  HCT 20.4* 22.0* 20.8*  PLT 54* 52* 50*   Coag's No results for input(s): APTT, INR in the last 168  hours.  Sepsis Markers  Recent Labs Lab 05/03/16 1006 05/04/16 0305 05/05/16 0332  PROCALCITON 0.30 0.26 0.16   ABG  Recent Labs Lab 05/07/16 1758 05/08/16 0352 05/09/16 0305  PHART 7.301* 7.289* 7.284*  PCO2ART 47.6* 49.9* 45.5*  PO2ART 103.0* 72.6* 84.0   Liver Enzymes  Recent Labs Lab 05/05/16 0305 05/06/16 0230 05/07/16 0203  ALBUMIN 2.9* 2.7* 2.9*   Cardiac Enzymes  Recent Labs Lab 05/02/16 1000  TROPONINI 0.36*   Glucose  Recent Labs Lab 05/08/16 0757 05/08/16 1151 05/08/16 1608 05/08/16 1940 05/09/16 0012 05/09/16 0303  GLUCAP 189* 233* 211* 146* 190* 162*   Imaging Dg Chest Port 1 View  05/09/2016  CLINICAL DATA:  Hypoxia EXAM: PORTABLE CHEST 1 VIEW COMPARISON:  May 08, 2016 FINDINGS: Endotracheal tube tip is 2.3 cm above the carina. Nasogastric tube tip and side port are below the diaphragm. Central catheter tip is in the superior vena cava. No pneumothorax. There is patchy airspace consolidation in both upper lobes as well as in both the lung base regions. There is evidence of interstitial edema in the lung bases. There is a small left pleural effusion. There is cardiomegaly with mild pulmonary venous hypertension. No adenopathy evident. IMPRESSION: Tube and catheter positions as described without pneumothorax. Evidence of a degree of congestive heart failure. Airspace opacity is also noted in the  upper lobes and bases which may represent alveolar edema or superimposed pneumonia. Both entities may exist concurrently. Electronically Signed   By: Bretta Bang III M.D.   On: 05/09/2016 07:13   STUDIES:  5/19 Echo >> mod LVH, EF 65 to 70%, grade 1 diastolic CHF 5/24 X-ray Neck >> Regressed density & stranding in neck. 5/24 CT neck w/o >> Airway in tact. No hematoma. Indentation of posterior pharynx by anatomy. Mild stranding right anterior chest & bilateral chin could be due to cellulitis or bruising.  MICROBIOLOGY: MRSA PCR 5/19:  Negative    ANTIBIOTICS: Ceftaz 5/30 >>  Vancomycin start date unknown>>>  SIGNIFICANT EVENTS: 5/19 Admit, cardiology consulted 5/21 Transfer to ICU with respiratory distress from Rt neck hematoma 5/22 Transferred back to SDU, tolerating  5/24- worseing status resp failure, back to ICU 5/26- Improving status transferred to SDU 5/30 transferred back to ICU 2/2 "resp distress/anxiety". Better on Bipap. 6/4 reintubated for hypoxemic respiratory failure and paralyzed for asynchrony.  LINES/TUBES: 5/19 Rt IJ CVL >> ETT 6/4>>>  ASSESSMENT / PLAN:  PULMONARY A: Acute Hypoxemic Hypercapnic Resp Fx 2/2 Exacerbation of COPD, HCAP bibasilar (L > R), VCD, likely with OSA (undiagnosed) P:   - Increase RR to 28 for respiratory acidosis. - Titrate FiO2 to keep SpO2 > 92% - Breo d/c'ed 5/27 due to hoarseness/ upper airway cough. - Duoneb PRN. - Pulmicort BID, brovana bid. - Continue solumedrol 40 mg IV q6 for now. - Continue abx. - D/C paralytics.  CARDIOVASCULAR A:  NSTEMI - Cath currently on hold. Hypertensive Urgency - BP better Acute on Chronic Diastolic CHF H/O HTN P:  - Holding ASA, heparin gtt in setting of hematoma >> might be able to resume soon - Lasix 160 mg IV q8. - Renal services following. - Continue amlodipine, lipitor, hydralazine, imdur - Defer timing of cardiac cath to cardiology  RENAL A:   Acute on Chronic Renal Failure Stage IV - Creatinine worsening. P:   - Monitoring electrolytes & renal function daily. - Replace electrolytes as indicated. - Renal consult called. - KVO IVF. - Follow CVP.  GASTROINTESTINAL A:   H/O GERD Questionable Aspiration P:   - TF per nutrition. - Protonix bid ac.   HEMATOLOGIC A:   Neck Hematoma - Resolving. Anemia - Mild. No obvious ongoing bleeding, HGB 10.3 ( 5/30) Thrombocytopenia P:  - Trending cell counts daily w/ CBC. - SCDs. - HIT panel pending.  INFECTIOUS A:   CXR with Bibasilar infiltrates, L > R. Likely  HCAP P:   - Day 8/14 fortaz. - Vanc. - F/U on cultures, NTD.  ENDOCRINE A:   Steroid induced hyperglycemia - BG controlled.  P:   - Accu-Checks q4hr. - SSI per resistant algorithm.  NEUROLOGIC A:   Anxiety D/O Sedate on vent H/O Depression  P: - D/C Propofol and replace with precedex once paralytics are off. - Fentanyl drip. - Vecuronium drip to off.  No family bedside.  The patient is critically ill with multiple organ systems failure and requires high complexity decision making for assessment and support, frequent evaluation and titration of therapies, application of advanced monitoring technologies and extensive interpretation of multiple databases.   Critical Care Time devoted to patient care services described in this note is  35  Minutes. This time reflects time of care of this signee Dr Koren Bound. This critical care time does not reflect procedure time, or teaching time or supervisory time of PA/NP/Med student/Med Resident etc but could involve care discussion  time.  Rush Farmer, M.D. [facility] Pulmonary/Critical Care Medicine. Pager: [phone]. After hours pager: [phone].

## 2016-05-09 NOTE — Progress Notes (Signed)
S: sedated O:BP 159/49 mmHg  Pulse 85  Temp(Src) 96.1 F (35.6 C) (Core (Comment))  Resp 28  Ht _0  (1.6 m)  Wt 107.8 kg (237 lb 10.5 oz)  BMI 42.11 kg/m2  SpO2 96%  Intake/Output Summary (Last 24 hours) at 05/09/16 0981 Last data filed at 05/09/16 0700  Gross per 24 hour  Intake 2953.6 ml  Output    185 ml  Net 2768.6 ml   Weight change: 3.4 kg (7 lb 7.9 oz) Gen: Sedated CVS:RRR Resp:Clear ant Abd:+ BS ND Ext: 1+ edema NEURO: Sedated/paralyzed   . sodium chloride   Intravenous Once  . amLODipine  10 mg Per Tube Daily  . antiseptic oral rinse  7 mL Mouth Rinse 10 times per day  . arformoterol  15 mcg Nebulization BID  . artificial tears  1 application Both Eyes [id number]  . atorvastatin  40 mg Per Tube q1800  . budesonide (PULMICORT) nebulizer solution  0.25 mg Nebulization Q6H  . cefTAZidime (FORTAZ)  IV  2 g Intravenous Q24H  . chlorhexidine gluconate (SAGE KIT)  15 mL Mouth Rinse BID  . cloNIDine  0.1 mg Per Tube TID  . escitalopram  20 mg Per Tube Daily  . feeding supplement (PRO-STAT SUGAR FREE 64)  60 mL Per Tube QID  . feeding supplement (VITAL HIGH PROTEIN)  1,000 mL Per Tube Q24H  . furosemide  160 mg Intravenous Q8H  . hydrALAZINE  50 mg Per Tube Q8H  . insulin aspart  0-20 Units Subcutaneous Q4H  . ipratropium-albuterol  3 mL Nebulization Q6H  . isosorbide mononitrate  120 mg Oral Daily  . methylPREDNISolone (SOLU-MEDROL) injection  40 mg Intravenous Q6H  . pantoprazole sodium  40 mg Per Tube Q24H  . sodium chloride flush  10-40 mL Intracatheter Q12H  . [START ON 05/10/2016] vancomycin  1,500 mg Intravenous Q48H   Dg Chest Port 1 View  05/09/2016  CLINICAL DATA:  Hypoxia EXAM: PORTABLE CHEST 1 VIEW COMPARISON:  May 08, 2016 FINDINGS: Endotracheal tube tip is 2.3 cm above the carina. Nasogastric tube tip and side port are below the diaphragm. Central catheter tip is in the superior vena cava. No pneumothorax. There is patchy airspace consolidation in both upper  lobes as well as in both the lung base regions. There is evidence of interstitial edema in the lung bases. There is a small left pleural effusion. There is cardiomegaly with mild pulmonary venous hypertension. No adenopathy evident. IMPRESSION: Tube and catheter positions as described without pneumothorax. Evidence of a degree of congestive heart failure. Airspace opacity is also noted in the upper lobes and bases which may represent alveolar edema or superimposed pneumonia. Both entities may exist concurrently. Electronically Signed   By: Lowella Grip III M.D.   On: 05/09/2016 07:13   Dg Chest Port 1 View  05/08/2016  CLINICAL DATA:  Respiratory failure with pneumonia, history of COPD, previous MI, CHF, acute and chronic renal failure. EXAM: PORTABLE CHEST 1 VIEW COMPARISON:  Portable chest x-ray of May 07, 2016 FINDINGS: There is persistent alveolar opacity in the right upper lobe with lesser amounts in both lower lobes. The left hemidiaphragm remains partially obscured. The cardiac silhouette is enlarged but stable. The pulmonary vascularity is mildly prominent centrally. There is no large pleural effusion. There is no pneumothorax. The endotracheal tube tip lies 2.7 cm above the carina. The esophagogastric tube tip projects below the inferior margin of the image. The right internal jugular venous catheter tip projects over the  junction of the proximal and midportions of the SVC. IMPRESSION: Slight interval deterioration in the appearance of the left lower lung where there is infiltrate or atelectasis and small pleural effusion. Slight increased density at the right lung base without significant pleural effusion. Persistent alveolar opacities in the right upper lobe compatible with pneumonia. Electronically Signed   By: David  Martinique M.D.   On: 05/08/2016 07:15   Dg Chest Port 1 View  05/07/2016  CLINICAL DATA:  Status post intubation EXAM: PORTABLE CHEST 1 VIEW COMPARISON:  05/07/2016 FINDINGS: There is  a ET tube with tip above the carina. Right IJ catheter tip is in the projection of the SVC. There is a nasogastric tube with tip below the field of view. Bilateral upper lobe and left lower lobe airspace opacities are unchanged from previous exam. IMPRESSION: 1. No change in bilateral airspace opacities compatible with multifocal infection. 2. ET tube tip is in satisfactory position above the carina. Electronically Signed   By: Kerby Moors M.D.   On: 05/07/2016 13:41   BMET    Component Value Date/Time   NA 136 05/09/2016 0300   K 4.5 05/09/2016 0300   CL 106 05/09/2016 0300   CO2 19* 05/09/2016 0300   GLUCOSE 156* 05/09/2016 0300   BUN 111* 05/09/2016 0300   CREATININE 2.90* 05/09/2016 0300   CREATININE 1.98* 01/05/2016 0923   CALCIUM 7.1* 05/09/2016 0300   GFRNONAA 15* 05/09/2016 0300   GFRAA 17* 05/09/2016 0300   CBC    Component Value Date/Time   WBC 9.6 05/09/2016 0855   RBC 2.60* 05/09/2016 0855   HGB 8.3* 05/09/2016 0855   HCT 25.6* 05/09/2016 0855   PLT 52* 05/09/2016 0855   MCV 98.5 05/09/2016 0855   MCH 31.9 05/09/2016 0855   MCHC 32.4 05/09/2016 0855   RDW 16.4* 05/09/2016 0855   LYMPHSABS 1.1 05/03/2016 0335   MONOABS 0.8 05/03/2016 0335   EOSABS 0.0 05/03/2016 0335   BASOSABS 0.0 05/03/2016 0335     Assessment: 1.  Acute on CKD 3 most likely hemodynamically mediated due to low BP post complication from line placement.  UO low and Scr sl higher though making some urine now 2. CKD 3 sec HTN 3. COPD 4. ? PNA 5. NSTEMI Plan: 1.  Cont IV lasix.   2. If renal fx worse tomorrow then will plan HD cath placement.   3. Daily labs   Evalyne Cortopassi T

## 2016-05-09 NOTE — Progress Notes (Signed)
Pharmacy Antibiotic Note  [name] [name] is a 75 y.o. female admitted on 05/01/2016 for NSTEMI also being treated for HCAP. Pharmacy has been consulted for ceftazidime and vancomycin dosing. Still with significant respiratory distress, so vancomycin was added and LOT of ceftazidime extended.   Plan: -Ceftazidime 2g IV Q24h -Change vancomycin to 1500mg  IV q48h with continued decline in renal function- next dose due tomorrow -follow clinical progression, c/s, renal function, trough PRN, LOT   Height: 5\' 3"  (160 cm) Weight: 237 lb 10.5 oz (107.8 kg) IBW/kg (Calculated) : 52.4  Temp (24hrs), Avg:98.1 F (36.7 C), Min:95.9 F (35.5 C), Max:99.7 F (37.6 C)   Recent Labs Lab 05/05/16 0305  05/06/16 0230 05/07/16 0200 05/07/16 0203 05/08/16 0405 05/08/16 0650 05/09/16 0300  WBC  --   < > 9.3 17.6*  --  11.8* 9.1 8.0  CREATININE 1.74*  --  1.78*  --  1.70* 2.58*  --  2.90*  < > = values in this interval not displayed.  Estimated Creatinine Clearance: 20.3 mL/min (by C-G formula based on Cr of 2.9).    Allergies  Allergen Reactions  . Ciprofloxacin     rash    Antimicrobials this admission: Ceftazidime 5/30 >>  Vancomycin 6/4>>  Dose adjustments this admission: 6/4: Ceftaz Increased to q12h for CrCl ~30 ml/min 6/5: Ceftaz decreased to q24h for CrCl ~22 6/6: vancomycin decreased to 1500mg  IV q48h  Microbiology results: MRSA PCR: negative 5/30 urine: mult species 6/4 sputum: reincubated  [name] Gibby D. Shakeita Vandevander, PharmD, BCPS Clinical Pharmacist Pager: [phone] 05/09/2016 9:19 AM

## 2016-05-09 NOTE — Progress Notes (Signed)
SUBJECTIVE:  Intubated  OBJECTIVE:   Vitals:   Filed Vitals:   05/09/16 0730 05/09/16 0800 05/09/16 0930 05/09/16 0955  BP:      Pulse:  87 85 84  Temp:  96.1 F (35.6 C) 96.1 F (35.6 C) 96.3 F (35.7 C)  TempSrc:   Core (Comment) Core (Comment)  Resp:  28 28 28   Height:      Weight:      SpO2: 94% 95% 96% 96%   I&O's:   Intake/Output Summary (Last 24 hours) at 05/09/16 1005 Last data filed at 05/09/16 0700  Gross per 24 hour  Intake   2807 ml  Output    185 ml  Net   2622 ml   TELEMETRY: Reviewed telemetry pt in NSR:     PHYSICAL EXAM General: Intubated, sedated Head:   Normal cephalic and atramatic  Lungs:  Coarse breath sounds bilaterally to auscultation. Heart:   HRRR S1 S2  No JVD.   Abdomen: abdomen soft and non-tender Msk:  Intubated, sedated Extremities:   No edema.   Neuro: Intubated, sedated Psych:  Intubated, sedated Skin: No rash   LABS: Basic Metabolic Panel:  Recent Labs  21/30/86 0405 05/08/16 1730 05/09/16 0300  NA 136  --  136  K 4.8  --  4.5  CL 103  --  106  CO2 24  --  19*  GLUCOSE 171*  --  156*  BUN 86*  --  111*  CREATININE 2.58*  --  2.90*  CALCIUM 8.0*  --  7.1*  MG 2.7* 2.7* 2.5*  PHOS 5.4* 5.5* 5.3*   Liver Function Tests:  Recent Labs  05/07/16 0203  ALBUMIN 2.9*   No results for input(s): LIPASE, AMYLASE in the last 72 hours. CBC:  Recent Labs  05/09/16 0300 05/09/16 0855  WBC 8.0 9.6  HGB 6.7* 8.3*  HCT 20.8* 25.6*  MCV 101.5* 98.5  PLT 50* 52*   Cardiac Enzymes: No results for input(s): CKTOTAL, CKMB, CKMBINDEX, TROPONINI in the last 72 hours. BNP: Invalid input(s): POCBNP D-Dimer: No results for input(s): DDIMER in the last 72 hours. Hemoglobin A1C: No results for input(s): HGBA1C in the last 72 hours. Fasting Lipid Panel:  Recent Labs  05/07/16 1245  TRIG 186*   Thyroid Function Tests: No results for input(s): TSH, T4TOTAL, T3FREE, THYROIDAB in the last 72 hours.  Invalid  input(s): FREET3 Anemia Panel: No results for input(s): VITAMINB12, FOLATE, FERRITIN, TIBC, IRON, RETICCTPCT in the last 72 hours. Coag Panel:   Lab Results  Component Value Date   INR 1.13 04/23/2016   INR 1.17 04/28/2016   INR 1.02 04/15/2015    RADIOLOGY: Dg Neck Soft Tissue  04/26/2016  CLINICAL DATA:  75 year old female with stridor, wheezing. Initial encounter. EXAM: NECK SOFT TISSUES - 1+ VIEW COMPARISON:  04/23/2016 neck x-rays and earlier. FINDINGS: Right IJ approach venous catheter remains in place, tip not included. On the lateral view of the neck there is abnormal submental/ anterior neck subcutaneous stranding (arrow). The leftward tracheal deviation seen previously has regressed. The abnormal prevertebral soft tissue contour demonstrated on 04/23/2016 has regressed but not completely normalized (13 mm at the C2 level versus normal of 5mm or less). Mass effect on the pharynx has regressed but not resolved. No subcutaneous gas identified. No pneumothorax in the visible lung apices. IMPRESSION: Regressed but not resolved abnormal density and stranding in the neck which probably reflects resolving trans-spatial hematoma in this setting. Neck CT (IV contrast preferred) may  confirm as needed. Electronically Signed   By: Odessa Fleming M.D.   On: 04/26/2016 13:18   Dg Neck Soft Tissue  04/23/2016  CLINICAL DATA:  Right neck pain/ swelling EXAM: NECK SOFT TISSUES - 1+ VIEW COMPARISON:  None. FINDINGS: Prevertebral soft tissues anterior to the upper cervical spine are markedly enlarged on the lateral view, indicating prevertebral edema. On the frontal radiograph, the trachea is deviated to the left. IMPRESSION: Suspected prevertebral edema with leftward tracheal deviation. CT neck with contrast is suggested for further characterization. Electronically Signed   By: Charline Bills M.D.   On: 04/23/2016 09:25   Dg Chest 1 View  05/01/2016  CLINICAL DATA:  Central line placement EXAM: CHEST 1 VIEW  COMPARISON:  04/18/2016 chest radiograph. FINDINGS: Right internal jugular central venous catheter terminates in the middle third of the superior vena cava. Stable cardiomediastinal silhouette with top-normal heart size. No pneumothorax. No right pleural effusion. No overt pulmonary edema. Small left pleural effusion, probably stable. Mild left basilar atelectasis. IMPRESSION: 1. Right internal jugular central venous catheter terminates in the middle third of the superior vena cava. No pneumothorax. 2. Stable small left pleural effusion with mild left basilar atelectasis. Electronically Signed   By: Delbert Phenix M.D.   On: 04/12/2016 15:48   Ct Soft Tissue Neck Wo Contrast  04/26/2016  CLINICAL DATA:  Hematoma right neck. 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 COMPARISON:  Neck radiographs 04/26/2016 FINDINGS: Pharynx and larynx: Tongue is normal. Tonsils are normal. Nasopharynx normal. There is tortuosity of the right internal carotid indenting the posterior wall of the oropharynx. Some of this may account for prevertebral soft tissue thickening noted on x-ray. Prevertebral soft tissues measure up 12 mm in thickness on the CT but no mass or hematoma identified. Marked improvement in prevertebral soft tissue swelling since the x-ray of 04/23/2016. This may be due to resolving hematoma. Airway intact. Larynx normal. Salivary glands: Prominent parotid glands bilaterally which are fatty replaced. Submandibular glands normal bilaterally. Thyroid: Negative Lymph nodes: Negative for adenopathy in the neck. Vascular: Atherosclerotic calcification in the carotid bifurcation bilaterally. Right internal carotid artery is tortuous with retropharyngeal course indenting the posterior oropharynx. Right jugular central venous catheter in good position. No hematoma at the  catheter site. Limited intracranial: Negative Visualized orbits: Not imaged Mastoids and visualized paranasal sinuses: Small air-fluid level in the sphenoid sinus. Remaining paranasal sinuses clear. Mastoid sinus clear bilaterally. Skeleton: Mild disc degeneration and spurring in the thoracic spine. No fracture or bone lesion. Advanced degenerative change in the left TMJ and moderate degenerative change in the right TMJ. Upper chest: Mild stranding in the right upper chest subcutaneous fat. There is also similar stranding in the subcutaneous fat of the chin bilaterally left greater than right. These areas may be due to bruising or cellulitis. No frank hematoma identified. Lung apices clear. Prominent extrapleural fat in the lung apices posteriorly bilaterally. IMPRESSION: The airway is  intact.  No hematoma is identified. Prevertebral soft tissue swelling due to large patient size and medial deviation of the right internal carotid artery causing indentation of the posterior oropharynx. Currently no hematoma. Marked prevertebral soft tissue swelling on 04/23/2016 May have been edema or hematoma which has resolved. Mild stranding in the right anterior chest and bilateral chin may be due to cellulitis or bruising. Electronically Signed   By: Marlan Palau M.D.   On:  04/26/2016 16:08   Dg Chest Port 1 View  05/09/2016  CLINICAL DATA:  Hypoxia EXAM: PORTABLE CHEST 1 VIEW COMPARISON:  May 08, 2016 FINDINGS: Endotracheal tube tip is 2.3 cm above the carina. Nasogastric tube tip and side port are below the diaphragm. Central catheter tip is in the superior vena cava. No pneumothorax. There is patchy airspace consolidation in both upper lobes as well as in both the lung base regions. There is evidence of interstitial edema in the lung bases. There is a small left pleural effusion. There is cardiomegaly with mild pulmonary venous hypertension. No adenopathy evident. IMPRESSION: Tube and catheter positions as described  without pneumothorax. Evidence of a degree of congestive heart failure. Airspace opacity is also noted in the upper lobes and bases which may represent alveolar edema or superimposed pneumonia. Both entities may exist concurrently. Electronically Signed   By: Bretta Bang III M.D.   On: 05/09/2016 07:13   Dg Chest Port 1 View  05/08/2016  CLINICAL DATA:  Respiratory failure with pneumonia, history of COPD, previous MI, CHF, acute and chronic renal failure. EXAM: PORTABLE CHEST 1 VIEW COMPARISON:  Portable chest x-ray of May 07, 2016 FINDINGS: There is persistent alveolar opacity in the right upper lobe with lesser amounts in both lower lobes. The left hemidiaphragm remains partially obscured. The cardiac silhouette is enlarged but stable. The pulmonary vascularity is mildly prominent centrally. There is no large pleural effusion. There is no pneumothorax. The endotracheal tube tip lies 2.7 cm above the carina. The esophagogastric tube tip projects below the inferior margin of the image. The right internal jugular venous catheter tip projects over the junction of the proximal and midportions of the SVC. IMPRESSION: Slight interval deterioration in the appearance of the left lower lung where there is infiltrate or atelectasis and small pleural effusion. Slight increased density at the right lung base without significant pleural effusion. Persistent alveolar opacities in the right upper lobe compatible with pneumonia. Electronically Signed   By: David  [place] M.D.   On: 05/08/2016 07:15   Dg Chest Port 1 View  05/07/2016  CLINICAL DATA:  Status post intubation EXAM: PORTABLE CHEST 1 VIEW COMPARISON:  05/07/2016 FINDINGS: There is a ET tube with tip above the carina. Right IJ catheter tip is in the projection of the SVC. There is a nasogastric tube with tip below the field of view. Bilateral upper lobe and left lower lobe airspace opacities are unchanged from previous exam. IMPRESSION: 1. No change in bilateral  airspace opacities compatible with multifocal infection. 2. ET tube tip is in satisfactory position above the carina. Electronically Signed   By: Signa Kell M.D.   On: 05/07/2016 13:41   Dg Chest Port 1 View  05/07/2016  CLINICAL DATA:  Respiratory distress EXAM: PORTABLE CHEST 1 VIEW COMPARISON:  05/05/2016 FINDINGS: Right central line remains in place, unchanged. Stable airspace opacities in the left mid and lower lung. New airspace opacity in the right upper lobe. Findings compatible with multifocal pneumonia. Heart is borderline in size. No effusions or acute bony abnormality. IMPRESSION: Stable patchy left lung airspace disease within the right upper lobe consolidation. Electronically Signed   By: Charlett Nose M.D.   On: 05/07/2016 07:09   Dg Chest Port 1 View  05/05/2016  CLINICAL DATA:  Acute respiratory failure and pneumonia, history of COPD, CHF and MI, acute renal failure. EXAM: PORTABLE CHEST 1 VIEW COMPARISON:  Portable chest x-ray of May 03, 2016. FINDINGS: The lungs are well-expanded. Confluent alveolar  opacity in the left mid lung is slightly less conspicuous today. There is a trace of pleural fluid blunting the left lateral costophrenic angle. The cardiac silhouette is mildly enlarged. The central pulmonary vascularity is prominent. The right internal jugular venous catheter tip projects over the midportion of the SVC. IMPRESSION: Slight interval improvement in the appearance of the airspace opacities in the left mid lung but significant abnormality remains. Stable mild interstitial prominence diffusely. Stable cardiomegaly. Electronically Signed   By: David  [place] M.D.   On: 05/05/2016 07:33   Dg Chest Port 1 View  05/03/2016  CLINICAL DATA:  Followup exam for respiratory failure air. Increased wheezing this morning. History of COPD. EXAM: PORTABLE CHEST 1 VIEW COMPARISON:  05/02/2016 FINDINGS: Air space consolidation, most evident in the left mid lung, is similar to the previous day's  study allowing for differences patient positioning and technique. No new lung abnormalities. No convincing pleural effusion. No pneumothorax. Right internal jugular central venous line is stable with its tip in the mid superior vena cava. IMPRESSION: 1. No significant change from the previous day's study. 2. Persistent airspace lung opacity, greatest on the left, consistent with pneumonia. Electronically Signed   By: Amie Portland M.D.   On: 05/03/2016 09:09   Dg Chest Port 1 View  05/02/2016  CLINICAL DATA:  Acute onset shortness of breath today. COPD with acute exacerbation. Myocardial infarct. Stage 4 chronic kidney disease. EXAM: PORTABLE CHEST 1 VIEW COMPARISON:  04/26/2016 FINDINGS: New asymmetric airspace disease is seen involving the left mid and lower lung, and the right lung base to lesser extent. This may be due to asymmetric edema or infection. No definite evidence pleural effusion. Heart size remains normal. Right jugular central venous catheter remains in appropriate position. No pneumothorax visualized. IMPRESSION: New asymmetric airspace disease involving left lung greater than right. Differential diagnosis includes asymmetric edema and infection. Electronically Signed   By: Myles Rosenthal M.D.   On: 05/02/2016 14:00   Dg Chest Port 1 View  04/26/2016  CLINICAL DATA:  75 year old female with stridor and wheezing. Initial encounter. EXAM: PORTABLE CHEST 1 VIEW COMPARISON:  0828 hours today and earlier. FINDINGS: Portable AP semi upright view at 1243 hours. Stable right IJ central line, tip at the level of the aortic arch, cephalad SVC. Stable cardiac size and mediastinal contours. No pneumothorax or pulmonary edema. No consolidation. Left lung base ventilation is stable. No new pulmonary opacity. IMPRESSION: Stable ventilation since 0828 hours today. No new cardiopulmonary abnormality. Electronically Signed   By: Odessa Fleming M.D.   On: 04/26/2016 13:20   Dg Chest Port 1 View  04/26/2016  CLINICAL  DATA:  Acute respiratory failure with hypoxia EXAM: PORTABLE CHEST - 1 VIEW COMPARISON:  04/10/2016 FINDINGS: Stable right IJ central venous catheter. Lungs are clear. Improved aeration of the left lung base. Heart size and mediastinal contours are within normal limits. No effusion evident. Right shoulder arthroplasty hardware partially visualized. IMPRESSION: Improved aeration at the left lung base. Electronically Signed   By: Corlis Leak M.D.   On: 04/26/2016 08:45   Dg Chest Port 1 View  04/14/2016  CLINICAL DATA:  Respiratory failure. EXAM: PORTABLE CHEST 1 VIEW COMPARISON:  04/15/2016. FINDINGS: Right IJ line in stable position. Mild left base atelectasis and/or infiltrate. Small left pleural effusion. No pneumothorax. Heart size normal. Right shoulder replacement. IMPRESSION: 1.  Right IJ line in stable position. 2. Mild left base atelectasis and/or infiltrate. Small left pleural effusion. No interim change. Electronically Signed  ByMaisie Fus  Register   On: 04-May-2016 07:13      ASSESSMENT: Roosvelt Harps:    1) NSTEMI: Would consider cath after she has recovered from her respiratory illness ad renal failure.  Cr slightly increased compared to yesterday.  Corky Crafts, MD  05/09/2016  10:05 AM

## 2016-05-09 NOTE — Consult Note (Signed)
Chief Complaint: Patient was seen in consultation today for tunneled hemodialysis catheter placement at the request of Dr Koren Bound  Referring Physician(s): Dr Koren Bound  Supervising Physician: Gilmer Mor  Patient Status: In-pt  History of Present Illness: [name] [name] is a 75 y.o. female   Pt admitted with NSTEMI HTN emergency COPD exacerbation CKD Rt IJ HD cath was placed 5/19 - complicated by neck hematoma Airway compromise Intubated/vent Resp failure Followed by Renal for possible - CKD3 Worsening renal function; decreasing UOP Request now for tunneled hemodialysis catheter placement per Dr Briant Cedar and Dr Molli Knock    Past Medical History  Diagnosis Date  . Hypertension   . History of blood clots     Right leg & lung  . High cholesterol   . COPD (chronic obstructive pulmonary disease) (HCC)   . Asthma   . GERD (gastroesophageal reflux disease)   . PONV (postoperative nausea and vomiting)   . MI (myocardial infarction) (HCC)     X 2  . Heart murmur   . Shortness of breath   . Stroke (HCC)   . Anxiety   . Depression   . Difficulty swallowing solids   . Stomach ulcer   . Gout   . History of shingles   . Rash     MD aware and pt was given Monistat to use; on stomach, neck (hairline)  . Bruises easily   . Renal disorder     Past Surgical History  Procedure Laterality Date  . Abdominal hysterectomy    . Breast surgery Left   . Appendectomy    . Colonoscopy w/ polypectomy    . Upper gi endoscopy    . Esophagus surgery      Esophagus stretched during endoscopy  . Ivc filter    . Cataract extraction, bilateral Bilateral   . Shoulder hemi-arthroplasty Right 04/16/2015    Procedure: RIGHT SHOULDER HEMI-ARTHROPLASTY WITH BIOPSY;  Surgeon: Beverely Low, MD;  Location: [facility] OR;  Service: Orthopedics;  Laterality: Right;    Allergies: Ciprofloxacin  Medications: Prior to Admission medications   Medication Sig Start Date End Date Taking?  Authorizing Provider  aspirin EC 81 MG EC tablet Take 1 tablet (81 mg total) by mouth daily. 07/13/14  Yes Esperanza Sheets, MD  atorvastatin (LIPITOR) 40 MG tablet Take 40 mg by mouth daily at 6 PM.  10/24/15  Yes Historical Provider, MD  bisoprolol (ZEBETA) 10 MG tablet Take one tablet daily 12/29/15  Yes Historical Provider, MD  BREO ELLIPTA 200-25 MCG/INH AEPB Inhale 1 puff into the lungs daily.  01/04/16  Yes Historical Provider, MD  calcitRIOL (ROCALTROL) 0.25 MCG capsule Take 2 capsules by mouth daily.  07/30/14  Yes Historical Provider, MD  doxazosin (CARDURA) 4 MG tablet Take 1 tablet by mouth daily. 06/28/14  Yes Historical Provider, MD  ergocalciferol (VITAMIN D2) 50000 units capsule Take 50,000 Units by mouth once a week.   Yes Historical Provider, MD  fluticasone (CUTIVATE) 0.05 % cream Apply 1 application topically 2 (two) times daily.   Yes Historical Provider, MD  furosemide (LASIX) 80 MG tablet Take 80 mg by mouth daily.  07/13/14  Yes Lars Masson, MD  isosorbide mononitrate (IMDUR) 30 MG 24 hr tablet Take 3 tablets (90 mg total) by mouth daily. 10/25/15  Yes Lars Masson, MD  montelukast (SINGULAIR) 10 MG tablet Take 10 mg by mouth at bedtime.   Yes Historical Provider, MD  nitroGLYCERIN (NITROSTAT) 0.4 MG SL tablet Place  0.4 mg under the tongue every 5 (five) minutes as needed. Chest pain 02/25/16  Yes Historical Provider, MD  omeprazole (PRILOSEC) 20 MG capsule Take 20 mg by mouth daily.   Yes Historical Provider, MD  spironolactone (ALDACTONE) 25 MG tablet Take 25 mg by mouth daily.   Yes Historical Provider, MD  traZODone (DESYREL) 50 MG tablet Take 50 mg by mouth at bedtime.   Yes Historical Provider, MD  escitalopram (LEXAPRO) 20 MG tablet TAKE 1 TABLET(20 MG) BY MOUTH DAILY 05/08/16   Lars Masson, MD     Family History  Problem Relation Age of Onset  . Heart attack Father     in his 7s  . Congestive Heart Failure Mother     Social History   Social History   . Marital Status: Divorced    Spouse Name: N/A  . Number of Children: N/A  . Years of Education: N/A   Social History Main Topics  . Smoking status: Former Smoker -- 0.50 packs/day for 65 years    Types: Cigarettes    Quit date: 02/01/2013  . Smokeless tobacco: None     Comment: started at age 3, 1-2ppd  . Alcohol Use: No  . Drug Use: No  . Sexual Activity: No   Other Topics Concern  . None   Social History Narrative     Review of Systems: A 12 point ROS discussed and pertinent positives are indicated in the HPI above.  All other systems are negative.  Review of Systems  Constitutional:       Vent No response    Vital Signs: BP 146/45 mmHg  Pulse 89  Temp(Src) 97.3 F (36.3 C) (Core (Comment))  Resp 28  Ht 5\' 3"  (1.6 m)  Wt 237 lb 10.5 oz (107.8 kg)  BMI 42.11 kg/m2  SpO2 92%  Physical Exam  Cardiovascular: Normal rate.   Pulmonary/Chest:  Vent   Skin: Skin is warm.  Psychiatric:  Consented with Dtr at bedside  Nursing note and vitals reviewed.   Mallampati Score:  MD Evaluation Airway: Other (comments) Airway comments: vent Heart: WNL Abdomen: WNL Chest/ Lungs: Other (comments) Chest/ lungs comments: vent ASA  Classification: 3 Mallampati/Airway Score: Three  Imaging: Dg Neck Soft Tissue  04/26/2016  CLINICAL DATA:  75 year old female with stridor, wheezing. Initial encounter. EXAM: NECK SOFT TISSUES - 1+ VIEW COMPARISON:  04/23/2016 neck x-rays and earlier. FINDINGS: Right IJ approach venous catheter remains in place, tip not included. On the lateral view of the neck there is abnormal submental/ anterior neck subcutaneous stranding (arrow). The leftward tracheal deviation seen previously has regressed. The abnormal prevertebral soft tissue contour demonstrated on 04/23/2016 has regressed but not completely normalized (13 mm at the C2 level versus normal of 5mm or less). Mass effect on the pharynx has regressed but not resolved. No subcutaneous  gas identified. No pneumothorax in the visible lung apices. IMPRESSION: Regressed but not resolved abnormal density and stranding in the neck which probably reflects resolving trans-spatial hematoma in this setting. Neck CT (IV contrast preferred) may confirm as needed. Electronically Signed   By: Odessa Fleming M.D.   On: 04/26/2016 13:18   Dg Neck Soft Tissue  04/23/2016  CLINICAL DATA:  Right neck pain/ swelling EXAM: NECK SOFT TISSUES - 1+ VIEW COMPARISON:  None. FINDINGS: Prevertebral soft tissues anterior to the upper cervical spine are markedly enlarged on the lateral view, indicating prevertebral edema. On the frontal radiograph, the trachea is deviated to the left. IMPRESSION: Suspected  prevertebral edema with leftward tracheal deviation. CT neck with contrast is suggested for further characterization. Electronically Signed   By: Charline Bills M.D.   On: 04/23/2016 09:25   Dg Chest 1 View  04/13/2016  CLINICAL DATA:  Central line placement EXAM: CHEST 1 VIEW COMPARISON:  04/18/2016 chest radiograph. FINDINGS: Right internal jugular central venous catheter terminates in the middle third of the superior vena cava. Stable cardiomediastinal silhouette with top-normal heart size. No pneumothorax. No right pleural effusion. No overt pulmonary edema. Small left pleural effusion, probably stable. Mild left basilar atelectasis. IMPRESSION: 1. Right internal jugular central venous catheter terminates in the middle third of the superior vena cava. No pneumothorax. 2. Stable small left pleural effusion with mild left basilar atelectasis. Electronically Signed   By: Delbert Phenix M.D.   On: 05/02/2016 15:48   Ct Soft Tissue Neck Wo Contrast  04/26/2016  CLINICAL DATA:  Hematoma right neck. 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 COMPARISON:  Neck radiographs  04/26/2016 FINDINGS: Pharynx and larynx: Tongue is normal. Tonsils are normal. Nasopharynx normal. There is tortuosity of the right internal carotid indenting the posterior wall of the oropharynx. Some of this may account for prevertebral soft tissue thickening noted on x-ray. Prevertebral soft tissues measure up 12 mm in thickness on the CT but no mass or hematoma identified. Marked improvement in prevertebral soft tissue swelling since the x-ray of 04/23/2016. This may be due to resolving hematoma. Airway intact. Larynx normal. Salivary glands: Prominent parotid glands bilaterally which are fatty replaced. Submandibular glands normal bilaterally. Thyroid: Negative Lymph nodes: Negative for adenopathy in the neck. Vascular: Atherosclerotic calcification in the carotid bifurcation bilaterally. Right internal carotid artery is tortuous with retropharyngeal course indenting the posterior oropharynx. Right jugular central venous catheter in good position. No hematoma at the catheter site. Limited intracranial: Negative Visualized orbits: Not imaged Mastoids and visualized paranasal sinuses: Small air-fluid level in the sphenoid sinus. Remaining paranasal sinuses clear. Mastoid sinus clear bilaterally. Skeleton: Mild disc degeneration and spurring in the thoracic spine. No fracture or bone lesion. Advanced degenerative change in the left TMJ and moderate degenerative change in the right TMJ. Upper chest: Mild stranding in the right upper chest subcutaneous fat. There is also similar stranding in the subcutaneous fat of the chin bilaterally left greater than right. These areas may be due to bruising or cellulitis. No frank hematoma identified. Lung apices clear. Prominent extrapleural fat in the lung apices posteriorly bilaterally. IMPRESSION: The airway is  intact.  No hematoma is identified. Prevertebral soft tissue swelling due to large patient size and medial deviation of the right internal carotid artery causing  indentation of the posterior oropharynx. Currently no hematoma. Marked prevertebral soft tissue swelling on 04/23/2016 May have been edema or hematoma which has resolved. Mild stranding in the right anterior chest and bilateral chin may be due to cellulitis or bruising. Electronically Signed   By: Marlan Palau M.D.   On: 04/26/2016 16:08   Dg Chest Port 1 View  05/09/2016  CLINICAL DATA:  Hypoxia EXAM: PORTABLE CHEST 1 VIEW COMPARISON:  May 08, 2016 FINDINGS: Endotracheal tube tip is 2.3 cm above the carina. Nasogastric tube tip and side port are below the diaphragm. Central catheter tip is in the superior vena cava. No pneumothorax. There is patchy airspace consolidation in both upper lobes as well as in both the lung base regions. There  is evidence of interstitial edema in the lung bases. There is a small left pleural effusion. There is cardiomegaly with mild pulmonary venous hypertension. No adenopathy evident. IMPRESSION: Tube and catheter positions as described without pneumothorax. Evidence of a degree of congestive heart failure. Airspace opacity is also noted in the upper lobes and bases which may represent alveolar edema or superimposed pneumonia. Both entities may exist concurrently. Electronically Signed   By: Bretta Bang III M.D.   On: 05/09/2016 07:13   Dg Chest Port 1 View  05/08/2016  CLINICAL DATA:  Respiratory failure with pneumonia, history of COPD, previous MI, CHF, acute and chronic renal failure. EXAM: PORTABLE CHEST 1 VIEW COMPARISON:  Portable chest x-ray of May 07, 2016 FINDINGS: There is persistent alveolar opacity in the right upper lobe with lesser amounts in both lower lobes. The left hemidiaphragm remains partially obscured. The cardiac silhouette is enlarged but stable. The pulmonary vascularity is mildly prominent centrally. There is no large pleural effusion. There is no pneumothorax. The endotracheal tube tip lies 2.7 cm above the carina. The esophagogastric tube tip  projects below the inferior margin of the image. The right internal jugular venous catheter tip projects over the junction of the proximal and midportions of the SVC. IMPRESSION: Slight interval deterioration in the appearance of the left lower lung where there is infiltrate or atelectasis and small pleural effusion. Slight increased density at the right lung base without significant pleural effusion. Persistent alveolar opacities in the right upper lobe compatible with pneumonia. Electronically Signed   By: David  [place] M.D.   On: 05/08/2016 07:15   Dg Chest Port 1 View  05/07/2016  CLINICAL DATA:  Status post intubation EXAM: PORTABLE CHEST 1 VIEW COMPARISON:  05/07/2016 FINDINGS: There is a ET tube with tip above the carina. Right IJ catheter tip is in the projection of the SVC. There is a nasogastric tube with tip below the field of view. Bilateral upper lobe and left lower lobe airspace opacities are unchanged from previous exam. IMPRESSION: 1. No change in bilateral airspace opacities compatible with multifocal infection. 2. ET tube tip is in satisfactory position above the carina. Electronically Signed   By: Signa Kell M.D.   On: 05/07/2016 13:41   Dg Chest Port 1 View  05/07/2016  CLINICAL DATA:  Respiratory distress EXAM: PORTABLE CHEST 1 VIEW COMPARISON:  05/05/2016 FINDINGS: Right central line remains in place, unchanged. Stable airspace opacities in the left mid and lower lung. New airspace opacity in the right upper lobe. Findings compatible with multifocal pneumonia. Heart is borderline in size. No effusions or acute bony abnormality. IMPRESSION: Stable patchy left lung airspace disease within the right upper lobe consolidation. Electronically Signed   By: Charlett Nose M.D.   On: 05/07/2016 07:09   Dg Chest Port 1 View  05/05/2016  CLINICAL DATA:  Acute respiratory failure and pneumonia, history of COPD, CHF and MI, acute renal failure. EXAM: PORTABLE CHEST 1 VIEW COMPARISON:  Portable chest  x-ray of May 03, 2016. FINDINGS: The lungs are well-expanded. Confluent alveolar opacity in the left mid lung is slightly less conspicuous today. There is a trace of pleural fluid blunting the left lateral costophrenic angle. The cardiac silhouette is mildly enlarged. The central pulmonary vascularity is prominent. The right internal jugular venous catheter tip projects over the midportion of the SVC. IMPRESSION: Slight interval improvement in the appearance of the airspace opacities in the left mid lung but significant abnormality remains. Stable mild interstitial prominence diffusely. Stable  cardiomegaly. Electronically Signed   By: David  [place] M.D.   On: 05/05/2016 07:33   Dg Chest Port 1 View  05/03/2016  CLINICAL DATA:  Followup exam for respiratory failure air. Increased wheezing this morning. History of COPD. EXAM: PORTABLE CHEST 1 VIEW COMPARISON:  05/02/2016 FINDINGS: Air space consolidation, most evident in the left mid lung, is similar to the previous day's study allowing for differences patient positioning and technique. No new lung abnormalities. No convincing pleural effusion. No pneumothorax. Right internal jugular central venous line is stable with its tip in the mid superior vena cava. IMPRESSION: 1. No significant change from the previous day's study. 2. Persistent airspace lung opacity, greatest on the left, consistent with pneumonia. Electronically Signed   By: Amie Portland M.D.   On: 05/03/2016 09:09   Dg Chest Port 1 View  05/02/2016  CLINICAL DATA:  Acute onset shortness of breath today. COPD with acute exacerbation. Myocardial infarct. Stage 4 chronic kidney disease. EXAM: PORTABLE CHEST 1 VIEW COMPARISON:  04/26/2016 FINDINGS: New asymmetric airspace disease is seen involving the left mid and lower lung, and the right lung base to lesser extent. This may be due to asymmetric edema or infection. No definite evidence pleural effusion. Heart size remains normal. Right jugular central  venous catheter remains in appropriate position. No pneumothorax visualized. IMPRESSION: New asymmetric airspace disease involving left lung greater than right. Differential diagnosis includes asymmetric edema and infection. Electronically Signed   By: Myles Rosenthal M.D.   On: 05/02/2016 14:00   Dg Chest Port 1 View  04/26/2016  CLINICAL DATA:  75 year old female with stridor and wheezing. Initial encounter. EXAM: PORTABLE CHEST 1 VIEW COMPARISON:  0828 hours today and earlier. FINDINGS: Portable AP semi upright view at 1243 hours. Stable right IJ central line, tip at the level of the aortic arch, cephalad SVC. Stable cardiac size and mediastinal contours. No pneumothorax or pulmonary edema. No consolidation. Left lung base ventilation is stable. No new pulmonary opacity. IMPRESSION: Stable ventilation since 0828 hours today. No new cardiopulmonary abnormality. Electronically Signed   By: Odessa Fleming M.D.   On: 04/26/2016 13:20   Dg Chest Port 1 View  04/26/2016  CLINICAL DATA:  Acute respiratory failure with hypoxia EXAM: PORTABLE CHEST - 1 VIEW COMPARISON:  04/10/2016 FINDINGS: Stable right IJ central venous catheter. Lungs are clear. Improved aeration of the left lung base. Heart size and mediastinal contours are within normal limits. No effusion evident. Right shoulder arthroplasty hardware partially visualized. IMPRESSION: Improved aeration at the left lung base. Electronically Signed   By: Corlis Leak M.D.   On: 04/26/2016 08:45   Dg Chest Port 1 View  05/02/2016  CLINICAL DATA:  Respiratory failure. EXAM: PORTABLE CHEST 1 VIEW COMPARISON:  04/20/2016. FINDINGS: Right IJ line in stable position. Mild left base atelectasis and/or infiltrate. Small left pleural effusion. No pneumothorax. Heart size normal. Right shoulder replacement. IMPRESSION: 1.  Right IJ line in stable position. 2. Mild left base atelectasis and/or infiltrate. Small left pleural effusion. No interim change. Electronically Signed   By:  Maisie Fus  Register   On: 04/18/2016 07:13    Labs:  CBC:  Recent Labs  05/08/16 0405 05/08/16 0650 05/09/16 0300 05/09/16 0855  WBC 11.8* 9.1 8.0 9.6  HGB 6.5* 7.2* 6.7* 8.3*  HCT 20.4* 22.0* 20.8* 25.6*  PLT 54* 52* 50* 52*    COAGS:  Recent Labs  04/26/2016 1240 04/23/16 1115  INR 1.17 1.13    BMP:  Recent Labs  05/06/16 0230 05/07/16 0203 05/08/16 0405 05/09/16 0300  NA 137 139 136 136  K 4.4 4.7 4.8 4.5  CL 100* 102 103 106  CO2 27 27 24  19*  GLUCOSE 167* 97 171* 156*  BUN 67* 67* 86* 111*  CALCIUM 8.7* 9.0 8.0* 7.1*  CREATININE 1.78* 1.70* 2.58* 2.90*  GFRNONAA 27* 29* 17* 15*  GFRAA 31* 33* 20* 17*    LIVER FUNCTION TESTS:  Recent Labs  10/25/15 1559 01/05/16 0923  05/04/16 0305 05/05/16 0305 05/06/16 0230 05/07/16 0203  BILITOT 0.5 0.4  --   --   --   --   --   AST 22 20  --   --   --   --   --   ALT 19 17  --   --   --   --   --   ALKPHOS 105 90  --   --   --   --   --   PROT 6.5 6.0*  --   --   --   --   --   ALBUMIN 4.0 3.7  < > 3.0* 2.9* 2.7* 2.9*  < > = values in this interval not displayed.  TUMOR MARKERS: No results for input(s): AFPTM, CEA, CA199, CHROMGRNA in the last 8760 hours.  Assessment and Plan:  NSTEMI Central Line placed 5/19 emergently Neck hematoma---airway compromise Resp Failure On vent Worsening renal function and UOP decreasing Scheduled for tunneled HD cath in IR 6/7 Risks and Benefits discussed with the patient's daughter including, but not limited to bleeding, infection, vascular injury, pneumothorax which may require chest tube placement, air embolism or even death All of the patient's daughter questions were answered, she is agreeable to proceed. Consent signed and in chart.   Thank you for this interesting consult.  I greatly enjoyed meeting [employer] and look forward to participating in their care.  A copy of this report was sent to the requesting provider on this date.  Electronically  Signed: Baily Hovanec A 05/09/2016, 12:46 PM   I spent a total of 40 Minutes    in face to face in clinical consultation, greater than 50% of which was counseling/coordinating care for tunneled HD cath

## 2016-05-10 ENCOUNTER — Inpatient Hospital Stay (HOSPITAL_COMMUNITY): Payer: Medicare Other

## 2016-05-10 DIAGNOSIS — I480 Paroxysmal atrial fibrillation: Secondary | ICD-10-CM

## 2016-05-10 LAB — TYPE AND SCREEN
ABO/RH(D): O NEG
ANTIBODY SCREEN: NEGATIVE
Unit division: 0

## 2016-05-10 LAB — BLOOD GAS, ARTERIAL
Acid-base deficit: 8 mmol/L — ABNORMAL HIGH (ref 0.0–2.0)
BICARBONATE: 18.5 meq/L — AB (ref 20.0–24.0)
Drawn by: 270271
FIO2: 0.6
O2 Saturation: 98.4 %
PATIENT TEMPERATURE: 99.5
PCO2 ART: 48.9 mmHg — AB (ref 35.0–45.0)
PEEP: 8 cmH2O
RATE: 28 resp/min
TCO2: 19.9 mmol/L (ref 0–100)
VT: 420 mL
pH, Arterial: 7.205 — ABNORMAL LOW (ref 7.350–7.450)
pO2, Arterial: 145 mmHg — ABNORMAL HIGH (ref 80.0–100.0)

## 2016-05-10 LAB — BASIC METABOLIC PANEL
ANION GAP: 15 (ref 5–15)
BUN: 172 mg/dL — AB (ref 6–20)
CHLORIDE: 98 mmol/L — AB (ref 101–111)
CO2: 19 mmol/L — ABNORMAL LOW (ref 22–32)
Calcium: 7.9 mg/dL — ABNORMAL LOW (ref 8.9–10.3)
Creatinine, Ser: 4.08 mg/dL — ABNORMAL HIGH (ref 0.44–1.00)
GFR calc Af Amer: 12 mL/min — ABNORMAL LOW (ref >60)
GFR, EST NON AFRICAN AMERICAN: 10 mL/min — AB (ref >60)
GLUCOSE: 173 mg/dL — AB (ref 65–99)
POTASSIUM: 5.6 mmol/L — AB (ref 3.5–5.1)
SODIUM: 132 mmol/L — AB (ref 135–145)

## 2016-05-10 LAB — GLUCOSE, CAPILLARY
GLUCOSE-CAPILLARY: 129 mg/dL — AB (ref 65–99)
GLUCOSE-CAPILLARY: 138 mg/dL — AB (ref 65–99)
GLUCOSE-CAPILLARY: 210 mg/dL — AB (ref 65–99)
GLUCOSE-CAPILLARY: 272 mg/dL — AB (ref 65–99)
Glucose-Capillary: 165 mg/dL — ABNORMAL HIGH (ref 65–99)
Glucose-Capillary: 253 mg/dL — ABNORMAL HIGH (ref 65–99)

## 2016-05-10 LAB — CBC
HCT: 30.8 % — ABNORMAL LOW (ref 36.0–46.0)
HEMOGLOBIN: 9.7 g/dL — AB (ref 12.0–15.0)
MCH: 31.5 pg (ref 26.0–34.0)
MCHC: 31.5 g/dL (ref 30.0–36.0)
MCV: 100 fL (ref 78.0–100.0)
Platelets: 72 10*3/uL — ABNORMAL LOW (ref 150–400)
RBC: 3.08 MIL/uL — AB (ref 3.87–5.11)
RDW: 16.8 % — ABNORMAL HIGH (ref 11.5–15.5)
WBC: 15.8 10*3/uL — AB (ref 4.0–10.5)

## 2016-05-10 LAB — PROTIME-INR
INR: 1.33 (ref 0.00–1.49)
Prothrombin Time: 16.7 seconds — ABNORMAL HIGH (ref 11.6–15.2)

## 2016-05-10 LAB — PREPARE PLATELET PHERESIS: UNIT DIVISION: 0

## 2016-05-10 LAB — VANCOMYCIN, RANDOM: Vancomycin Rm: 36 ug/mL

## 2016-05-10 LAB — PHOSPHORUS: Phosphorus: 7.8 mg/dL — ABNORMAL HIGH (ref 2.5–4.6)

## 2016-05-10 LAB — MAGNESIUM: MAGNESIUM: 2.7 mg/dL — AB (ref 1.7–2.4)

## 2016-05-10 MED ORDER — DEXTROSE 5 % IV SOLN
1.0000 g | INTRAVENOUS | Status: DC
Start: 2016-05-11 — End: 2016-05-13
  Administered 2016-05-11 – 2016-05-12 (×2): 1 g via INTRAVENOUS
  Filled 2016-05-10 (×3): qty 1

## 2016-05-10 MED ORDER — LIDOCAINE-EPINEPHRINE (PF) 1 %-1:200000 IJ SOLN
INTRAMUSCULAR | Status: AC
  Filled 2016-05-10: qty 30

## 2016-05-10 MED ORDER — DEXTROSE 5 % IV SOLN
1.0000 g | INTRAVENOUS | Status: DC
  Filled 2016-05-10: qty 1

## 2016-05-10 MED ORDER — METOPROLOL TARTRATE 25 MG/10 ML ORAL SUSPENSION
12.5000 mg | Freq: Two times a day (BID) | ORAL | Status: DC
  Administered 2016-05-10 – 2016-05-12 (×5): 12.5 mg
  Filled 2016-05-10 (×5): qty 10

## 2016-05-10 MED ORDER — GELATIN ABSORBABLE 12-7 MM EX MISC
CUTANEOUS | Status: AC
  Filled 2016-05-10: qty 1

## 2016-05-10 MED ORDER — MIDAZOLAM HCL 2 MG/2ML IJ SOLN
INTRAMUSCULAR | Status: DC
Start: 2016-05-10 — End: 2016-05-10
  Filled 2016-05-10: qty 2

## 2016-05-10 MED ORDER — CEFAZOLIN SODIUM-DEXTROSE 2-4 GM/100ML-% IV SOLN
2.0000 g | INTRAVENOUS | Status: AC
  Administered 2016-05-10: 2 g via INTRAVENOUS

## 2016-05-10 MED ORDER — HEPARIN SODIUM (PORCINE) 1000 UNIT/ML IJ SOLN
INTRAMUSCULAR | Status: AC
  Filled 2016-05-10: qty 1

## 2016-05-10 MED ORDER — CEFAZOLIN SODIUM-DEXTROSE 2-4 GM/100ML-% IV SOLN
INTRAVENOUS | Status: AC
  Filled 2016-05-10: qty 100

## 2016-05-10 MED ORDER — FENTANYL CITRATE (PF) 100 MCG/2ML IJ SOLN
INTRAMUSCULAR | Status: AC
  Filled 2016-05-10: qty 2

## 2016-05-10 NOTE — Progress Notes (Signed)
eLink Physician-Brief Progress Note Patient Name: [name] [name] DOB: 1941/06/27 MRN: [id number]   Date of Service  05/10/2016  HPI/Events of Note  rn reports tele new fib  eICU Interventions  Will confirm ecg Even if fib  She is rate controlled on own and cant use blood thinnrs      Intervention Category Intermediate Interventions: Arrhythmia - evaluation and management  Nelda Bucks. 05/10/2016, 6:21 AM

## 2016-05-10 NOTE — Procedures (Signed)
Successful LT IJ HD CATH TIP SVC/RA NO COMP READY FOR USE FULL REPORT IN PACS

## 2016-05-10 NOTE — Progress Notes (Addendum)
PULMONARY / CRITICAL CARE MEDICINE   Name: [name] [name] MRN: [id number] DOB: 1941-03-23    ADMISSION DATE:  04-28-2016 CONSULTATION DATE:  04/23/2016  REFERRING MD:  Viann Fish, Teodora Medici  CHIEF COMPLAINT:  Can't breath  Brief Profile  75 yo obese wf quit smoking one year pta  admitted with NSTEMI, HTN urgency, and AECOPD. She has hx of CKD and difficulty IV access. She had Rt IJ CVL placed 5/19 >> developed Rt neck hematoma with airway compromise and transferred to ICU. She has been transferred to SDU as her respiratory status has improved.   SUBJECTIVE:  Hg dropped overnight requiring transfusion.  Hypertensive without sedation.  Paroxsymal a-fib overnight.  VITAL SIGNS: BP 198/64 mmHg  Pulse 110  Temp(Src) 97.5 F (36.4 C) (Core (Comment))  Resp 28  Ht  (1.6 m)  Wt 108.1 kg (238 lb 5.1 oz)  BMI 42.23 kg/m2  SpO2 94%   INTAKE / OUTPUT: I/O last 3 completed shifts: In: 3194.4 [I.V.:1191.4; Blood:509; NG/GT:1180; IV Piggyback:314] Out: 303 [Urine:303]  PHYSICAL EXAMINATION: General: Sedated . Neuro: Not withdrawing to pain.  No gag and no dolls eye.  No respiratory drive. HEENT: Ecchymosis around neck and upper chest, ETT ok. Cardiac: RRR, Nl S1/S2, -M/R/G. Chest: Coarse BS diffusely. Abd: Soft, NT, ND and +BS. Ext: 2+ edema Skin: Multiple areas of ecchymosis  LABS:  BMET  Recent Labs Lab 05/08/16 0405 05/09/16 0300 05/10/16 0406  NA 136 136 132*  K 4.8 4.5 5.6*  CL 103 106 98*  CO2 24 19* 19*  BUN 86* 111* 172*  CREATININE 2.58* 2.90* 4.08*  GLUCOSE 171* 156* 173*   Electrolytes  Recent Labs Lab 05/08/16 0405 05/08/16 1730 05/09/16 0300 05/10/16 0406  CALCIUM 8.0*  --  7.1* 7.9*  MG 2.7* 2.7* 2.5* 2.7*  PHOS 5.4* 5.5* 5.3* 7.8*   CBC  Recent Labs Lab 05/09/16 0300 05/09/16 0855 05/10/16 0406  WBC 8.0 9.6 15.8*  HGB 6.7* 8.3* 9.7*  HCT 20.8* 25.6* 30.8*  PLT 50* 52* 72*   Coag's  Recent Labs Lab 05/10/16 0406  INR  1.33    Sepsis Markers  Recent Labs Lab 05/03/16 1006 05/04/16 0305 05/05/16 0332  PROCALCITON 0.30 0.26 0.16   ABG  Recent Labs Lab 05/08/16 0352 05/09/16 0305 05/10/16 0407  PHART 7.289* 7.284* 7.205*  PCO2ART 49.9* 45.5* 48.9*  PO2ART 72.6* 84.0 145*   Liver Enzymes  Recent Labs Lab 05/05/16 0305 05/06/16 0230 05/07/16 0203  ALBUMIN 2.9* 2.7* 2.9*   Cardiac Enzymes No results for input(s): TROPONINI, PROBNP in the last 168 hours. Glucose  Recent Labs Lab 05/09/16 1203 05/09/16 1559 05/09/16 1936 05/09/16 2359 05/10/16 0347 05/10/16 0756  GLUCAP 211* 208* 187* 210* 129* 138*   Imaging Dg Chest Port 1 View  05/10/2016  CLINICAL DATA:  Intubated patient, respiratory failure, COPD, NSTEMI, chronic CHF, acute and chronic renal failure. EXAM: PORTABLE CHEST 1 VIEW COMPARISON:  Portable chest x-ray of May 09, 2016 FINDINGS: The patient is rotated toward the left. The lungs are reasonably well inflated. Persistent increased interstitial density is present throughout the right lung and at the left lung base. There is persistent obscuration of the left hemidiaphragm. The cardiac silhouette is mildly enlarged but stable. The pulmonary vascularity is indistinct. The endotracheal tube tip lies 5.2 cm above the carina. The right internal jugular venous catheter tip projects over the midportion of the SVC. The esophagogastric tube tip projects below the inferior margin of the image. IMPRESSION: Persistent increased  interstitial densities within the right lung and at the left lung base compatible with pulmonary edema. Left lower lobe atelectasis or pneumonia with small pleural effusion is stable. The support tubes are in reasonable position. Electronically Signed   By: David  [place] M.D.   On: 05/10/2016 07:32   STUDIES:  5/19 Echo >> mod LVH, EF 65 to 70%, grade 1 diastolic CHF 5/24 X-ray Neck >> Regressed density & stranding in neck. 5/24 CT neck w/o >> Airway in tact. No  hematoma. Indentation of posterior pharynx by anatomy. Mild stranding right anterior chest & bilateral chin could be due to cellulitis or bruising.  MICROBIOLOGY: MRSA PCR 5/19:  Negative   ANTIBIOTICS: Ceftaz 5/30 >>  Vancomycin start date unknown>>>  SIGNIFICANT EVENTS: 5/19 Admit, cardiology consulted 5/21 Transfer to ICU with respiratory distress from Rt neck hematoma 5/22 Transferred back to SDU, tolerating  5/24- worseing status resp failure, back to ICU 5/26- Improving status transferred to SDU 5/30 transferred back to ICU 2/2 "resp distress/anxiety". Better on Bipap. 6/4 reintubated for hypoxemic respiratory failure and paralyzed for asynchrony.  LINES/TUBES: 5/19 Rt IJ CVL >> ETT 6/4>>>  ASSESSMENT / PLAN:  PULMONARY A: Acute Hypoxemic Hypercapnic Resp Fx 2/2 Exacerbation of COPD, HCAP bibasilar (L > R), VCD, likely with OSA (undiagnosed) P:   - Increase RR to 34 for mixed acidosis. - Titrate FiO2 to keep SpO2 > 92% - Breo d/c'ed 5/27 due to hoarseness/upper airway cough. - Duoneb PRN. - Pulmicort BID, brovana bid. - Continue solumedrol 40 mg IV q6 for now. - Continue abx. - D/Ced paralytics 6/6, no respiratory drive.  CARDIOVASCULAR A:  NSTEMI - Cath currently on hold. Hypertensive Urgency - BP better Acute on Chronic Diastolic CHF H/O HTN P:  - Holding ASA, heparin gtt in setting of hematoma >> might be able to resume soon - Continue Lasix 160 mg IV q8. - Renal services following. - Continue amlodipine, lipitor, hydralazine, (D/Ced) imdur - Defer timing of cardiac cath to cardiology. - Add lopressor 12.5 mg PO BID.  RENAL A:   Acute on Chronic Renal Failure Stage IV - Creatinine worsening. P:   - Monitoring electrolytes & renal function daily. - Replace electrolytes as indicated. - Renal consult appreciated. - HD catheter placement per IR today then start HD. - KVO IVF. - Follow CVP.  GASTROINTESTINAL A:   H/O GERD Questionable  Aspiration P:   - TF per nutrition. - Protonix bid ac.   HEMATOLOGIC A:   Neck Hematoma - Resolving. Anemia - Mild. No obvious ongoing bleeding, HGB 10.3 ( 5/30) Thrombocytopenia P:  - Trending cell counts daily w/ CBC. - SCDs. - HIT panel pending, no anticoagulation however given frequent bleeding.  INFECTIOUS A:   CXR with Bibasilar infiltrates, L > R. Likely HCAP P:   - Day 9/14 fortaz. - Vanc. - F/U on cultures, NTD.  ENDOCRINE A:   Steroid induced hyperglycemia - BG controlled.  P:   - Accu-Checks q4hr. - SSI per resistant algorithm.  NEUROLOGIC A:   Anxiety D/O Sedate on vent H/O Depression  P: - Precedex off. - Fentanyl drip. - Vecuronium drip to off 6/6.  Spoke with daughter extensively bedside, after discussion, decision was made to make patient a LCB with no CPR/cardioversion/trach/peg.  If after removal of fluid we are unable to extubate then comfort, if able to extubate then one way extubation only.  The patient is critically ill with multiple organ systems failure and requires high complexity decision making for assessment  and support, frequent evaluation and titration of therapies, application of advanced monitoring technologies and extensive interpretation of multiple databases.   Critical Care Time devoted to patient care services described in this note is  36  Minutes. This time reflects time of care of this signee Dr Koren Bound. This critical care time does not reflect procedure time, or teaching time or supervisory time of PA/NP/Med student/Med Resident etc but could involve care discussion time.  Alyson Reedy, M.D. [facility] Pulmonary/Critical Care Medicine. Pager: [phone]. After hours pager: [phone].

## 2016-05-10 NOTE — Progress Notes (Signed)
RT Note: Pt transported to CT & IR with no complications. RT to monitor.

## 2016-05-10 NOTE — Progress Notes (Addendum)
ADDENDUM Random vancomycin level drawn at noon was elevated at 50mcg/mL- expected d/t progressive worsening of renal failure.   Plan is for HD today when access is placed. Likely will need HD again tomorrow.  Plan: -hold vancomycin for now. Even if she had an entire HD session (which doubt she would tolerate), post-HD level would still be elevated at ~14mcg/mL -ceftazidime 1g IV q24 starting at 2000 tomorrow (already received 2g today)  Joziah Dollins D. Tsering Leaman, PharmD, BCPS Clinical Pharmacist Pager: [phone] 05/10/2016 3:08 PM    Pharmacy Antibiotic Note  [name] [name] is a 75 y.o. female admitted on 2016-05-15 for NSTEMI also being treated for HCAP. Pharmacy has been consulted for ceftazidime and vancomycin dosing. Still with significant respiratory distress, so vancomycin was added and LOT of ceftazidime extended. Now AKI progressing and plans for temp catheter placement today with HD session.  Plan: -Ceftazidime 1g IV q24h- can change to 2g IV qHD once schedule is known -Obtain a random vancomycin level prior to HD session today and then f/u HD tolerance to determine if a re-dose is needed -follow clinical progression, c/s, HD schedule/tolerance, LOT    Height: 5\' 3"  (160 cm) Weight: 238 lb 5.1 oz (108.1 kg) IBW/kg (Calculated) : 52.4  Temp (24hrs), Avg:98.4 F (36.9 C), Min:96.1 F (35.6 C), Max:100.6 F (38.1 C)   Recent Labs Lab 05/06/16 0230  05/07/16 0203 05/08/16 0405 05/08/16 0650 05/09/16 0300 05/09/16 0855 05/10/16 0406  WBC 9.3  < >  --  11.8* 9.1 8.0 9.6 15.8*  CREATININE 1.78*  --  1.70* 2.58*  --  2.90*  --  4.08*  < > = values in this interval not displayed.  Estimated Creatinine Clearance: 14.5 mL/min (by C-G formula based on Cr of 4.08).    Allergies  Allergen Reactions  . Ciprofloxacin     rash    Antimicrobials this admission: Ceftazidime 5/30 >>  Vancomycin 6/4>>  Dose adjustments this admission: 6/4: Ceftaz Increased to q12h for CrCl ~30  ml/min 6/5: Ceftaz decreased to q24h for CrCl ~22 6/6: vancomycin decreased to 1500mg  IV q48h 6/7: ceftazidime decreased to 1g IV q24h  Microbiology results: 5/19 MRSA PCR: neg 5/30 urine: mult species 6/4 sputum: nml flora 6/6 TA: pending  [name] [name] D. Ileah Falkenstein, PharmD, BCPS Clinical Pharmacist Pager: [phone] 05/10/2016 9:18 AM

## 2016-05-10 NOTE — Progress Notes (Signed)
S: sedated O:BP 155/46 mmHg  Pulse 87  Temp(Src) 97.5 F (36.4 C) (Core (Comment))  Resp 27  Ht 5' 3"  (1.6 m)  Wt 108.1 kg (238 lb 5.1 oz)  BMI 42.23 kg/m2  SpO2 96%  Intake/Output Summary (Last 24 hours) at 05/10/16 0831 Last data filed at 05/10/16 0518  Gross per 24 hour  Intake 1536.55 ml  Output    188 ml  Net 1348.55 ml   Weight change: 0.3 kg (10.6 oz) Gen: Sedated CVS:RRR Resp: Scattered wheezes Abd:+ BS ND Ext: 1+ edema NEURO: Sedated   . amLODipine  10 mg Per Tube Daily  . antiseptic oral rinse  7 mL Mouth Rinse 10 times per day  . arformoterol  15 mcg Nebulization BID  . artificial tears  1 application Both Eyes W5I  . atorvastatin  40 mg Per Tube q1800  . budesonide (PULMICORT) nebulizer solution  0.25 mg Nebulization Q6H  .  ceFAZolin (ANCEF) IV  2 g Intravenous to XRAY  . cefTAZidime (FORTAZ)  IV  2 g Intravenous Q24H  . chlorhexidine gluconate (SAGE KIT)  15 mL Mouth Rinse BID  . cloNIDine  0.1 mg Per Tube TID  . escitalopram  20 mg Per Tube Daily  . feeding supplement (PRO-STAT SUGAR FREE 64)  60 mL Per Tube QID  . feeding supplement (VITAL HIGH PROTEIN)  1,000 mL Per Tube Q24H  . furosemide  160 mg Intravenous Q8H  . hydrALAZINE  50 mg Per Tube Q8H  . insulin aspart  0-20 Units Subcutaneous Q4H  . ipratropium-albuterol  3 mL Nebulization Q6H  . isosorbide mononitrate  120 mg Oral Daily  . methylPREDNISolone (SOLU-MEDROL) injection  40 mg Intravenous Q6H  . pantoprazole sodium  40 mg Per Tube Q24H  . sodium chloride flush  10-40 mL Intracatheter Q12H  . vancomycin  1,500 mg Intravenous Q48H   Dg Chest Port 1 View  05/10/2016  CLINICAL DATA:  Intubated patient, respiratory failure, COPD, NSTEMI, chronic CHF, acute and chronic renal failure. EXAM: PORTABLE CHEST 1 VIEW COMPARISON:  Portable chest x-ray of May 09, 2016 FINDINGS: The patient is rotated toward the left. The lungs are reasonably well inflated. Persistent increased interstitial density is  present throughout the right lung and at the left lung base. There is persistent obscuration of the left hemidiaphragm. The cardiac silhouette is mildly enlarged but stable. The pulmonary vascularity is indistinct. The endotracheal tube tip lies 5.2 cm above the carina. The right internal jugular venous catheter tip projects over the midportion of the SVC. The esophagogastric tube tip projects below the inferior margin of the image. IMPRESSION: Persistent increased interstitial densities within the right lung and at the left lung base compatible with pulmonary edema. Left lower lobe atelectasis or pneumonia with small pleural effusion is stable. The support tubes are in reasonable position. Electronically Signed   By: David  [place] M.D.   On: 05/10/2016 07:32   Dg Chest Port 1 View  05/09/2016  CLINICAL DATA:  Hypoxia EXAM: PORTABLE CHEST 1 VIEW COMPARISON:  May 08, 2016 FINDINGS: Endotracheal tube tip is 2.3 cm above the carina. Nasogastric tube tip and side port are below the diaphragm. Central catheter tip is in the superior vena cava. No pneumothorax. There is patchy airspace consolidation in both upper lobes as well as in both the lung base regions. There is evidence of interstitial edema in the lung bases. There is a small left pleural effusion. There is cardiomegaly with mild pulmonary venous hypertension. No adenopathy  evident. IMPRESSION: Tube and catheter positions as described without pneumothorax. Evidence of a degree of congestive heart failure. Airspace opacity is also noted in the upper lobes and bases which may represent alveolar edema or superimposed pneumonia. Both entities may exist concurrently. Electronically Signed   By: Lowella Grip III M.D.   On: 05/09/2016 07:13   BMET    Component Value Date/Time   NA 132* 05/10/2016 0406   K 5.6* 05/10/2016 0406   CL 98* 05/10/2016 0406   CO2 19* 05/10/2016 0406   GLUCOSE 173* 05/10/2016 0406   BUN 172* 05/10/2016 0406   CREATININE 4.08*  05/10/2016 0406   CREATININE 1.98* 01/05/2016 0923   CALCIUM 7.9* 05/10/2016 0406   GFRNONAA 10* 05/10/2016 0406   GFRAA 12* 05/10/2016 0406   CBC    Component Value Date/Time   WBC 15.8* 05/10/2016 0406   RBC 3.08* 05/10/2016 0406   HGB 9.7* 05/10/2016 0406   HCT 30.8* 05/10/2016 0406   PLT 72* 05/10/2016 0406   MCV 100.0 05/10/2016 0406   MCH 31.5 05/10/2016 0406   MCHC 31.5 05/10/2016 0406   RDW 16.8* 05/10/2016 0406   LYMPHSABS 1.1 05/03/2016 0335   MONOABS 0.8 05/03/2016 0335   EOSABS 0.0 05/03/2016 0335   BASOSABS 0.0 05/03/2016 0335     Assessment: 1.  Acute on CKD 3 most likely hemodynamically mediated due to low BP post complication from line placement.  UO only 188cc 2. CKD 3 sec HTN 3. COPD 4. ? PNA 5. NSTEMI 6. Mild hyperkalemia Plan: 1.  For temp cath placement today and then will plan HD 2. Cont IV lasix thru today and if UO does not improve will Dc tomorrow 3. Recheck labs in AM and will most likely plan HD again tomorrow   Geneve Kimpel T

## 2016-05-10 NOTE — Progress Notes (Signed)
Called to room by family member because patient,s heart rate was fluctuating. EKG confirmed affib with heart rate less than 100. Md feinstein and Varanassi called. No new orders received at this time. Reassured and educated family about atrial fibrillation. Will continue to monitor

## 2016-05-10 NOTE — Progress Notes (Signed)
SUBJECTIVE:  Went into AFib  OBJECTIVE:   Vitals:   Filed Vitals:   05/10/16 1427 05/10/16 1500 05/10/16 1515 05/10/16 1527  BP:  174/77 135/46 150/41  Pulse:  151 100 99  Temp:  97.9 F (36.6 C) 97.7 F (36.5 C)   TempSrc:      Resp:  34 34 34  Height:      Weight:      SpO2: 94% 92% 91% 92%   I&O's:   Intake/Output Summary (Last 24 hours) at 05/10/16 1613 Last data filed at 05/10/16 1455  Gross per 24 hour  Intake 789.59 ml  Output    178 ml  Net 611.59 ml   TELEMETRY: Reviewed telemetry pt in sinus tach:     PHYSICAL EXAM General: Well developed, well nourished, in no acute distress Head:   Normal cephalic and atramatic  Lungs:   Coarse breath sounds bilaterally to auscultation. Heart:   HRRR S1 S2  No JVD.   Abdomen: abdomen soft and non-tender Msk:  Back normal,  Normal strength and tone for age. Extremities:  1+ bilateral edema.   Neuro: Alert and oriented. Psych:  Normal affect, responds appropriately Skin: No rash   LABS: Basic Metabolic Panel:  Recent Labs  45/40/98 0300 05/10/16 0406  NA 136 132*  K 4.5 5.6*  CL 106 98*  CO2 19* 19*  GLUCOSE 156* 173*  BUN 111* 172*  CREATININE 2.90* 4.08*  CALCIUM 7.1* 7.9*  MG 2.5* 2.7*  PHOS 5.3* 7.8*   Liver Function Tests: No results for input(s): AST, ALT, ALKPHOS, BILITOT, PROT, ALBUMIN in the last 72 hours. No results for input(s): LIPASE, AMYLASE in the last 72 hours. CBC:  Recent Labs  05/09/16 0855 05/10/16 0406  WBC 9.6 15.8*  HGB 8.3* 9.7*  HCT 25.6* 30.8*  MCV 98.5 100.0  PLT 52* 72*   Cardiac Enzymes: No results for input(s): CKTOTAL, CKMB, CKMBINDEX, TROPONINI in the last 72 hours. BNP: Invalid input(s): POCBNP D-Dimer: No results for input(s): DDIMER in the last 72 hours. Hemoglobin A1C: No results for input(s): HGBA1C in the last 72 hours. Fasting Lipid Panel: No results for input(s): CHOL, HDL, LDLCALC, TRIG, CHOLHDL, LDLDIRECT in the last 72 hours. Thyroid Function  Tests: No results for input(s): TSH, T4TOTAL, T3FREE, THYROIDAB in the last 72 hours.  Invalid input(s): FREET3 Anemia Panel: No results for input(s): VITAMINB12, FOLATE, FERRITIN, TIBC, IRON, RETICCTPCT in the last 72 hours. Coag Panel:   Lab Results  Component Value Date   INR 1.33 05/10/2016   INR 1.13 04/23/2016   INR 1.17 04/19/2016    RADIOLOGY: Dg Neck Soft Tissue  04/26/2016  CLINICAL DATA:  75 year old female with stridor, wheezing. Initial encounter. EXAM: NECK SOFT TISSUES - 1+ VIEW COMPARISON:  04/23/2016 neck x-rays and earlier. FINDINGS: Right IJ approach venous catheter remains in place, tip not included. On the lateral view of the neck there is abnormal submental/ anterior neck subcutaneous stranding (arrow). The leftward tracheal deviation seen previously has regressed. The abnormal prevertebral soft tissue contour demonstrated on 04/23/2016 has regressed but not completely normalized (13 mm at the C2 level versus normal of 5mm or less). Mass effect on the pharynx has regressed but not resolved. No subcutaneous gas identified. No pneumothorax in the visible lung apices. IMPRESSION: Regressed but not resolved abnormal density and stranding in the neck which probably reflects resolving trans-spatial hematoma in this setting. Neck CT (IV contrast preferred) may confirm as needed. Electronically Signed   By: Rexene Edison  Margo Aye M.D.   On: 04/26/2016 13:18   Dg Neck Soft Tissue  04/23/2016  CLINICAL DATA:  Right neck pain/ swelling EXAM: NECK SOFT TISSUES - 1+ VIEW COMPARISON:  None. FINDINGS: Prevertebral soft tissues anterior to the upper cervical spine are markedly enlarged on the lateral view, indicating prevertebral edema. On the frontal radiograph, the trachea is deviated to the left. IMPRESSION: Suspected prevertebral edema with leftward tracheal deviation. CT neck with contrast is suggested for further characterization. Electronically Signed   By: Charline Bills M.D.   On: 04/23/2016  09:25   Dg Chest 1 View  04/06/2016  CLINICAL DATA:  Central line placement EXAM: CHEST 1 VIEW COMPARISON:  04/18/2016 chest radiograph. FINDINGS: Right internal jugular central venous catheter terminates in the middle third of the superior vena cava. Stable cardiomediastinal silhouette with top-normal heart size. No pneumothorax. No right pleural effusion. No overt pulmonary edema. Small left pleural effusion, probably stable. Mild left basilar atelectasis. IMPRESSION: 1. Right internal jugular central venous catheter terminates in the middle third of the superior vena cava. No pneumothorax. 2. Stable small left pleural effusion with mild left basilar atelectasis. Electronically Signed   By: Delbert Phenix M.D.   On: 04/20/2016 15:48   Ct Soft Tissue Neck Wo Contrast  04/26/2016  CLINICAL DATA:  Hematoma right neck. 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 COMPARISON:  Neck radiographs 04/26/2016 FINDINGS: Pharynx and larynx: Tongue is normal. Tonsils are normal. Nasopharynx normal. There is tortuosity of the right internal carotid indenting the posterior wall of the oropharynx. Some of this may account for prevertebral soft tissue thickening noted on x-ray. Prevertebral soft tissues measure up 12 mm in thickness on the CT but no mass or hematoma identified. Marked improvement in prevertebral soft tissue swelling since the x-ray of 04/23/2016. This may be due to resolving hematoma. Airway intact. Larynx normal. Salivary glands: Prominent parotid glands bilaterally which are fatty replaced. Submandibular glands normal bilaterally. Thyroid: Negative Lymph nodes: Negative for adenopathy in the neck. Vascular: Atherosclerotic calcification in the carotid bifurcation bilaterally. Right internal carotid artery is tortuous with retropharyngeal course indenting the  posterior oropharynx. Right jugular central venous catheter in good position. No hematoma at the catheter site. Limited intracranial: Negative Visualized orbits: Not imaged Mastoids and visualized paranasal sinuses: Small air-fluid level in the sphenoid sinus. Remaining paranasal sinuses clear. Mastoid sinus clear bilaterally. Skeleton: Mild disc degeneration and spurring in the thoracic spine. No fracture or bone lesion. Advanced degenerative change in the left TMJ and moderate degenerative change in the right TMJ. Upper chest: Mild stranding in the right upper chest subcutaneous fat. There is also similar stranding in the subcutaneous fat of the chin bilaterally left greater than right. These areas may be due to bruising or cellulitis. No frank hematoma identified. Lung apices clear. Prominent extrapleural fat in the lung apices posteriorly bilaterally. IMPRESSION: The airway is  intact.  No hematoma is identified. Prevertebral soft tissue swelling due to large patient size and medial deviation of the right internal carotid artery causing indentation of the posterior oropharynx. Currently no hematoma. Marked prevertebral soft tissue swelling on 04/23/2016 May have been edema or hematoma which has resolved. Mild stranding in the right anterior chest and bilateral chin may be due to cellulitis or bruising. Electronically Signed   By: Marlan Palau M.D.   On: 04/26/2016 16:08   Dg Chest [facility] 1 [address]  05/10/2016  CLINICAL DATA:  Intubated patient, respiratory failure, COPD, NSTEMI, chronic CHF, acute and chronic renal failure. EXAM: PORTABLE CHEST 1 VIEW COMPARISON:  Portable chest x-ray of May 09, 2016 FINDINGS: The patient is rotated toward the left. The lungs are reasonably well inflated. Persistent increased interstitial density is present throughout the right lung and at the left lung base. There is persistent obscuration of the left hemidiaphragm. The cardiac silhouette is mildly enlarged but stable. The  pulmonary vascularity is indistinct. The endotracheal tube tip lies 5.2 cm above the carina. The right internal jugular venous catheter tip projects over the midportion of the SVC. The esophagogastric tube tip projects below the inferior margin of the image. IMPRESSION: Persistent increased interstitial densities within the right lung and at the left lung base compatible with pulmonary edema. Left lower lobe atelectasis or pneumonia with small pleural effusion is stable. The support tubes are in reasonable position. Electronically Signed   By: David  [place] M.D.   On: 05/10/2016 07:32   Dg Chest Port 1 View  05/09/2016  CLINICAL DATA:  Hypoxia EXAM: PORTABLE CHEST 1 VIEW COMPARISON:  May 08, 2016 FINDINGS: Endotracheal tube tip is 2.3 cm above the carina. Nasogastric tube tip and side port are below the diaphragm. Central catheter tip is in the superior vena cava. No pneumothorax. There is patchy airspace consolidation in both upper lobes as well as in both the lung base regions. There is evidence of interstitial edema in the lung bases. There is a small left pleural effusion. There is cardiomegaly with mild pulmonary venous hypertension. No adenopathy evident. IMPRESSION: Tube and catheter positions as described without pneumothorax. Evidence of a degree of congestive heart failure. Airspace opacity is also noted in the upper lobes and bases which may represent alveolar edema or superimposed pneumonia. Both entities may exist concurrently. Electronically Signed   By: Bretta Bang III M.D.   On: 05/09/2016 07:13   Dg Chest Port 1 View  05/08/2016  CLINICAL DATA:  Respiratory failure with pneumonia, history of COPD, previous MI, CHF, acute and chronic renal failure. EXAM: PORTABLE CHEST 1 VIEW COMPARISON:  Portable chest x-ray of May 07, 2016 FINDINGS: There is persistent alveolar opacity in the right upper lobe with lesser amounts in both lower lobes. The left hemidiaphragm remains partially obscured. The  cardiac silhouette is enlarged but stable. The pulmonary vascularity is mildly prominent centrally. There is no large pleural effusion. There is no pneumothorax. The endotracheal tube tip lies 2.7 cm above the carina. The esophagogastric tube tip projects below the inferior margin of the image. The right internal jugular venous catheter tip projects over the junction of the proximal and midportions of the SVC. IMPRESSION: Slight interval deterioration in the appearance of the left lower lung where there is infiltrate or atelectasis and small pleural effusion. Slight increased density at the right lung base without significant pleural effusion. Persistent alveolar opacities in the right upper lobe compatible with pneumonia. Electronically Signed   By: David  [place] M.D.   On: 05/08/2016 07:15   Dg Chest Port 1 View  05/07/2016  CLINICAL DATA:  Status post intubation EXAM: PORTABLE CHEST 1 VIEW COMPARISON:  05/07/2016 FINDINGS: There is a ET tube with tip above the carina. Right IJ catheter tip is in the projection of the SVC. There is a nasogastric tube with tip below the field of view. Bilateral upper lobe and left lower lobe airspace opacities are unchanged from previous exam. IMPRESSION: 1. No change in bilateral airspace opacities  compatible with multifocal infection. 2. ET tube tip is in satisfactory position above the carina. Electronically Signed   By: Signa Kell M.D.   On: 05/07/2016 13:41   Dg Chest Port 1 View  05/07/2016  CLINICAL DATA:  Respiratory distress EXAM: PORTABLE CHEST 1 VIEW COMPARISON:  05/05/2016 FINDINGS: Right central line remains in place, unchanged. Stable airspace opacities in the left mid and lower lung. New airspace opacity in the right upper lobe. Findings compatible with multifocal pneumonia. Heart is borderline in size. No effusions or acute bony abnormality. IMPRESSION: Stable patchy left lung airspace disease within the right upper lobe consolidation. Electronically Signed    By: Charlett Nose M.D.   On: 05/07/2016 07:09   Dg Chest Port 1 View  05/05/2016  CLINICAL DATA:  Acute respiratory failure and pneumonia, history of COPD, CHF and MI, acute renal failure. EXAM: PORTABLE CHEST 1 VIEW COMPARISON:  Portable chest x-ray of May 03, 2016. FINDINGS: The lungs are well-expanded. Confluent alveolar opacity in the left mid lung is slightly less conspicuous today. There is a trace of pleural fluid blunting the left lateral costophrenic angle. The cardiac silhouette is mildly enlarged. The central pulmonary vascularity is prominent. The right internal jugular venous catheter tip projects over the midportion of the SVC. IMPRESSION: Slight interval improvement in the appearance of the airspace opacities in the left mid lung but significant abnormality remains. Stable mild interstitial prominence diffusely. Stable cardiomegaly. Electronically Signed   By: David  [place] M.D.   On: 05/05/2016 07:33   Dg Chest Port 1 View  05/03/2016  CLINICAL DATA:  Followup exam for respiratory failure air. Increased wheezing this morning. History of COPD. EXAM: PORTABLE CHEST 1 VIEW COMPARISON:  05/02/2016 FINDINGS: Air space consolidation, most evident in the left mid lung, is similar to the previous day's study allowing for differences patient positioning and technique. No new lung abnormalities. No convincing pleural effusion. No pneumothorax. Right internal jugular central venous line is stable with its tip in the mid superior vena cava. IMPRESSION: 1. No significant change from the previous day's study. 2. Persistent airspace lung opacity, greatest on the left, consistent with pneumonia. Electronically Signed   By: Amie Portland M.D.   On: 05/03/2016 09:09   Dg Chest Port 1 View  05/02/2016  CLINICAL DATA:  Acute onset shortness of breath today. COPD with acute exacerbation. Myocardial infarct. Stage 4 chronic kidney disease. EXAM: PORTABLE CHEST 1 VIEW COMPARISON:  04/26/2016 FINDINGS: New  asymmetric airspace disease is seen involving the left mid and lower lung, and the right lung base to lesser extent. This may be due to asymmetric edema or infection. No definite evidence pleural effusion. Heart size remains normal. Right jugular central venous catheter remains in appropriate position. No pneumothorax visualized. IMPRESSION: New asymmetric airspace disease involving left lung greater than right. Differential diagnosis includes asymmetric edema and infection. Electronically Signed   By: Myles Rosenthal M.D.   On: 05/02/2016 14:00   Dg Chest Port 1 View  04/26/2016  CLINICAL DATA:  75 year old female with stridor and wheezing. Initial encounter. EXAM: PORTABLE CHEST 1 VIEW COMPARISON:  0828 hours today and earlier. FINDINGS: Portable AP semi upright view at 1243 hours. Stable right IJ central line, tip at the level of the aortic arch, cephalad SVC. Stable cardiac size and mediastinal contours. No pneumothorax or pulmonary edema. No consolidation. Left lung base ventilation is stable. No new pulmonary opacity. IMPRESSION: Stable ventilation since 0828 hours today. No new cardiopulmonary abnormality. Electronically Signed  By: Odessa Fleming M.D.   On: 04/26/2016 13:20   Dg Chest Port 1 View  04/26/2016  CLINICAL DATA:  Acute respiratory failure with hypoxia EXAM: PORTABLE CHEST - 1 VIEW COMPARISON:  05/03/2016 FINDINGS: Stable right IJ central venous catheter. Lungs are clear. Improved aeration of the left lung base. Heart size and mediastinal contours are within normal limits. No effusion evident. Right shoulder arthroplasty hardware partially visualized. IMPRESSION: Improved aeration at the left lung base. Electronically Signed   By: Corlis Leak M.D.   On: 04/26/2016 08:45   Dg Chest Port 1 View  05-03-2016  CLINICAL DATA:  Respiratory failure. EXAM: PORTABLE CHEST 1 VIEW COMPARISON:  04/08/2016. FINDINGS: Right IJ line in stable position. Mild left base atelectasis and/or infiltrate. Small left  pleural effusion. No pneumothorax. Heart size normal. Right shoulder replacement. IMPRESSION: 1.  Right IJ line in stable position. 2. Mild left base atelectasis and/or infiltrate. Small left pleural effusion. No interim change. Electronically Signed   By: Maisie Fus  Register   On: 05-03-2016 07:13      ASSESSMENT: AFib:  PLAN:  Started toprol.  No Heparin due to neck hematoma and need for transfusion. D/w Dr. Molli Knock.  Dialysis for fluid removal and then hopefully weaning of respiratory support.  Corky Crafts, MD  05/10/2016  4:13 PM

## 2016-05-11 ENCOUNTER — Inpatient Hospital Stay (HOSPITAL_COMMUNITY): Payer: Medicare Other

## 2016-05-11 LAB — BLOOD GAS, ARTERIAL
ACID-BASE DEFICIT: 2.6 mmol/L — AB (ref 0.0–2.0)
Bicarbonate: 22.2 mEq/L (ref 20.0–24.0)
DRAWN BY: 39898
FIO2: 0.6
LHR: 34 {breaths}/min
MECHVT: 420 mL
O2 Saturation: 98.6 %
PATIENT TEMPERATURE: 98.6
PCO2 ART: 41.8 mmHg (ref 35.0–45.0)
PEEP/CPAP: 5 cmH2O
PH ART: 7.344 — AB (ref 7.350–7.450)
PO2 ART: 126 mmHg — AB (ref 80.0–100.0)
TCO2: 23.5 mmol/L (ref 0–100)

## 2016-05-11 LAB — CBC WITH DIFFERENTIAL/PLATELET
BASOS ABS: 0 10*3/uL (ref 0.0–0.1)
BASOS PCT: 0 %
EOS ABS: 0 10*3/uL (ref 0.0–0.7)
EOS PCT: 0 %
HCT: 31.8 % — ABNORMAL LOW (ref 36.0–46.0)
Hemoglobin: 10.6 g/dL — ABNORMAL LOW (ref 12.0–15.0)
Lymphocytes Relative: 3 %
Lymphs Abs: 0.4 10*3/uL — ABNORMAL LOW (ref 0.7–4.0)
MCH: 33.3 pg (ref 26.0–34.0)
MCHC: 33.3 g/dL (ref 30.0–36.0)
MCV: 100 fL (ref 78.0–100.0)
MONO ABS: 0.4 10*3/uL (ref 0.1–1.0)
Monocytes Relative: 2 %
Neutro Abs: 15.6 10*3/uL — ABNORMAL HIGH (ref 1.7–7.7)
Neutrophils Relative %: 95 %
PLATELETS: 83 10*3/uL — AB (ref 150–400)
RBC: 3.18 MIL/uL — ABNORMAL LOW (ref 3.87–5.11)
RDW: 16.8 % — AB (ref 11.5–15.5)
WBC: 16.4 10*3/uL — ABNORMAL HIGH (ref 4.0–10.5)

## 2016-05-11 LAB — COMPREHENSIVE METABOLIC PANEL
ALK PHOS: 184 U/L — AB (ref 38–126)
ALT: 71 U/L — AB (ref 14–54)
ANION GAP: 14 (ref 5–15)
AST: 65 U/L — ABNORMAL HIGH (ref 15–41)
Albumin: 2.5 g/dL — ABNORMAL LOW (ref 3.5–5.0)
BILIRUBIN TOTAL: 1.7 mg/dL — AB (ref 0.3–1.2)
BUN: 147 mg/dL — ABNORMAL HIGH (ref 6–20)
CALCIUM: 8.1 mg/dL — AB (ref 8.9–10.3)
CO2: 21 mmol/L — AB (ref 22–32)
CREATININE: 3.63 mg/dL — AB (ref 0.44–1.00)
Chloride: 99 mmol/L — ABNORMAL LOW (ref 101–111)
GFR calc non Af Amer: 11 mL/min — ABNORMAL LOW (ref >60)
GFR, EST AFRICAN AMERICAN: 13 mL/min — AB (ref >60)
GLUCOSE: 147 mg/dL — AB (ref 65–99)
Potassium: 5.9 mmol/L — ABNORMAL HIGH (ref 3.5–5.1)
SODIUM: 134 mmol/L — AB (ref 135–145)
TOTAL PROTEIN: 5.2 g/dL — AB (ref 6.5–8.1)

## 2016-05-11 LAB — GLUCOSE, CAPILLARY
GLUCOSE-CAPILLARY: 164 mg/dL — AB (ref 65–99)
GLUCOSE-CAPILLARY: 177 mg/dL — AB (ref 65–99)
GLUCOSE-CAPILLARY: 145 mg/dL — AB (ref 65–99)
GLUCOSE-CAPILLARY: 183 mg/dL — AB (ref 65–99)
Glucose-Capillary: 163 mg/dL — ABNORMAL HIGH (ref 65–99)
Glucose-Capillary: 169 mg/dL — ABNORMAL HIGH (ref 65–99)

## 2016-05-11 LAB — PHOSPHORUS: PHOSPHORUS: 7.5 mg/dL — AB (ref 2.5–4.6)

## 2016-05-11 LAB — MAGNESIUM: Magnesium: 2.7 mg/dL — ABNORMAL HIGH (ref 1.7–2.4)

## 2016-05-11 LAB — TRIGLYCERIDES: Triglycerides: 245 mg/dL — ABNORMAL HIGH (ref <150)

## 2016-05-11 MED ORDER — VITAL 1.5 CAL PO LIQD
1000.0000 mL | ORAL | Status: DC
  Administered 2016-05-11 – 2016-05-12 (×2): 1000 mL
  Filled 2016-05-11 (×5): qty 1000

## 2016-05-11 MED ORDER — PRO-STAT SUGAR FREE PO LIQD
60.0000 mL | Freq: Three times a day (TID) | ORAL | Status: DC
  Administered 2016-05-11 – 2016-05-13 (×6): 60 mL
  Filled 2016-05-11 (×6): qty 60

## 2016-05-11 MED ORDER — HYDRALAZINE HCL 20 MG/ML IJ SOLN
20.0000 mg | INTRAMUSCULAR | Status: DC | PRN
  Administered 2016-05-11 – 2016-05-13 (×4): 20 mg via INTRAVENOUS
  Filled 2016-05-11 (×3): qty 1

## 2016-05-11 NOTE — Progress Notes (Signed)
PULMONARY / CRITICAL CARE MEDICINE   Name: [name] [name] MRN: [id number] DOB: 1941-03-02    ADMISSION DATE:  04/22/2016 CONSULTATION DATE:  04/23/2016  REFERRING MD:  Viann Fish, Teodora Medici  CHIEF COMPLAINT:  Can't breath  Brief Profile  75 yo obese wf quit smoking one year pta  admitted with NSTEMI, HTN urgency, and AECOPD. She has hx of CKD and difficulty IV access. She had Rt IJ CVL placed 5/19 >> developed Rt neck hematoma with airway compromise and transferred to ICU. She has been transferred to SDU as her respiratory status has improved.   SUBJECTIVE:  HD 6/7 x1 L off, repeat treatment again, no events overnight.    VITAL SIGNS: BP 185/56 mmHg  Pulse 94  Temp(Src) 97.2 F (36.2 C) (Core (Comment))  Resp 30  Ht 5\' 3"  (1.6 m)  Wt 107.9 kg (237 lb 14 oz)  BMI 42.15 kg/m2  SpO2 95%   INTAKE / OUTPUT: I/O last 3 completed shifts: In: 634.2 [I.V.:213; NG/GT:73.2; IV Piggyback:348] Out: 1658 [Urine:158; Other:1500]  PHYSICAL EXAMINATION: General: Sedated, flickered eye only. Neuro: Not withdrawing to pain.  No gag and no dolls eye.  No respiratory drive. HEENT: Ecchymosis around neck and upper chest, ETT ok. Cardiac: RRR, Nl S1/S2, -M/R/G. Chest: Coarse BS diffusely. Abd: Soft, NT, ND and +BS. Ext: 2+ edema Skin: Multiple areas of ecchymosis and blistering.  LABS:  BMET  Recent Labs Lab 05/09/16 0300 05/10/16 0406 05/11/16 0432  NA 136 132* 134*  K 4.5 5.6* 5.9*  CL 106 98* 99*  CO2 19* 19* 21*  BUN 111* 172* 147*  CREATININE 2.90* 4.08* 3.63*  GLUCOSE 156* 173* 147*   Electrolytes  Recent Labs Lab 05/09/16 0300 05/10/16 0406 05/11/16 0432  CALCIUM 7.1* 7.9* 8.1*  MG 2.5* 2.7* 2.7*  PHOS 5.3* 7.8* 7.5*   CBC  Recent Labs Lab 05/09/16 0855 05/10/16 0406 05/11/16 0432  WBC 9.6 15.8* 16.4*  HGB 8.3* 9.7* 10.6*  HCT 25.6* 30.8* 31.8*  PLT 52* 72* 83*   Coag's  Recent Labs Lab 05/10/16 0406  INR 1.33    Sepsis Markers  Recent  Labs Lab 05/05/16 0332  PROCALCITON 0.16   ABG  Recent Labs Lab 05/09/16 0305 05/10/16 0407 05/11/16 0350  PHART 7.284* 7.205* 7.344*  PCO2ART 45.5* 48.9* 41.8  PO2ART 84.0 145* 126*   Liver Enzymes  Recent Labs Lab 05/06/16 0230 05/07/16 0203 05/11/16 0432  AST  --   --  65*  ALT  --   --  71*  ALKPHOS  --   --  184*  BILITOT  --   --  1.7*  ALBUMIN 2.7* 2.9* 2.5*   Cardiac Enzymes No results for input(s): TROPONINI, PROBNP in the last 168 hours. Glucose  Recent Labs Lab 05/10/16 1114 05/10/16 1949 05/10/16 2018 05/11/16 0021 05/11/16 0410 05/11/16 0848  GLUCAP 165* 253* 272* 169* 164* 177*   Imaging Ct Head Wo Contrast  05/10/2016  CLINICAL DATA:  Altered mental status EXAM: CT HEAD WITHOUT CONTRAST TECHNIQUE: Contiguous axial images were obtained from the base of the skull through the vertex without intravenous contrast. COMPARISON:  January 19, 2013 FINDINGS: There is age related volume loss, stable. There is no intracranial mass, hemorrhage, extra-axial fluid collection, or midline shift. There is a focal area of decreased attenuation in the lateral left lentiform nucleus, felt to represent a prior small infarct. Elsewhere gray-white compartments appear normal. No acute infarct evident. Bony calvarium appears intact. Mastoid air cells are clear. No  intraorbital lesions are evident. There is mucosal thickening in the right frontal sinus. There is also an air-fluid level in the left sphenoid sinus region. 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 No acute infarct evident. No hemorrhage or mass effect. Areas of paranasal sinus disease. Electronically Signed   By: Bretta Bang III M.D.   On: 05/10/2016 17:24   Ir Fluoro Guide Cv Line Left  05/10/2016  INDICATION: Acute renal failure, no current access for dialysis. EXAM: ULTRASOUND GUIDANCE FOR VASCULAR ACCESS LEFT INTERNAL JUGULAR PERMANENT HEMODIALYSIS CATHETER Date:  6/7/20176/06/2016 4:55  pm Radiologist:  M. Ruel Favors, MD Guidance:  ULTRASOUND AND FLUOROSCOPIC FLUOROSCOPY TIME:  Fluoroscopy Time: 6 seconds (7 mGy). MEDICATIONS: PATIENT IS RECEIVING VANCOMYCIN AND CEFTAZ IS AS AN INPATIENT. ANESTHESIA/SEDATION: She is currently on fentanyl drip for sedation while on the ventilator. Moderate Sedation Time:  None. The patient was continuously monitored during the procedure by the interventional radiology nurse under my direct supervision. CONTRAST:  None. COMPLICATIONS: None immediate. PROCEDURE: Informed consent was obtained from the patient following explanation of the procedure, risks, benefits and alternatives. The patient understands, agrees and consents for the procedure. All questions were addressed. A time out was performed. Maximal barrier sterile technique utilized including caps, mask, sterile gowns, sterile gloves, large sterile drape, hand hygiene, and 2% chlorhexidine scrub. Under sterile conditions and local anesthesia, left internal jugular micropuncture venous access was performed with ultrasound. Images were obtained for documentation. Imaging confirms patency of the left internal jugular vein. A guide wire was inserted followed by a transitional dilator. Next, a 0.035 guidewire was advanced into the IVC with a 5-French catheter. Measurements were obtained from the left venotomy site to the proximal right atrium. In the left infraclavicular chest, a subcutaneous tunnel was created under sterile conditions and local anesthesia. 1% lidocaine with epinephrine was utilized for this. The 28 cm tip to cuff palindrome catheter was tunneled subcutaneously to the venotomy site and inserted into the SVC/RA junction through a valved peel-away sheath. Position was confirmed with fluoroscopy. Images were obtained for documentation. Blood was aspirated from the catheter followed by saline and heparin flushes. The appropriate volume and strength of heparin was instilled in each lumen.  Caps were applied. The catheter was secured at the tunnel site with Gelfoam and a pursestring suture. The venotomy site was closed with subcuticular Vicryl suture. Dermabond was applied to the small right neck incision. A dry sterile dressing was applied. The catheter is ready for use. No immediate complications. IMPRESSION: Ultrasound and fluoroscopically guided left internal jugular tunneled hemodialysis catheter (28 cm tip to cuff palindrome catheter). Electronically Signed   By: Judie Petit.  Shick M.D.   On: 05/10/2016 17:14   Ir US Guide Vasc Access Left  05/10/2016  INDICATION: Acute renal failure, no current access for dialysis. EXAM: ULTRASOUND GUIDANCE FOR VASCULAR ACCESS LEFT INTERNAL JUGULAR PERMANENT HEMODIALYSIS CATHETER Date:  6/7/20176/06/2016 4:55 pm Radiologist:  M. Ruel Favors, MD Guidance:  ULTRASOUND AND FLUOROSCOPIC FLUOROSCOPY TIME:  Fluoroscopy Time: 6 seconds (7 mGy). MEDICATIONS: PATIENT IS RECEIVING VANCOMYCIN AND CEFTAZ IS AS AN INPATIENT. ANESTHESIA/SEDATION: She is currently on fentanyl drip for sedation while on the ventilator. Moderate Sedation Time:  None. The patient was continuously monitored during the procedure by the interventional radiology nurse under my direct supervision. CONTRAST:  None. COMPLICATIONS: None immediate. PROCEDURE: Informed consent was obtained from the patient following explanation of the procedure, risks, benefits and alternatives. The patient understands, agrees and consents for the procedure. All  questions were addressed. A time out was performed. Maximal barrier sterile technique utilized including caps, mask, sterile gowns, sterile gloves, large sterile drape, hand hygiene, and 2% chlorhexidine scrub. Under sterile conditions and local anesthesia, left internal jugular micropuncture venous access was performed with ultrasound. Images were obtained for documentation. Imaging confirms patency of the left internal jugular vein. A guide wire was inserted  followed by a transitional dilator. Next, a 0.035 guidewire was advanced into the IVC with a 5-French catheter. Measurements were obtained from the left venotomy site to the proximal right atrium. In the left infraclavicular chest, a subcutaneous tunnel was created under sterile conditions and local anesthesia. 1% lidocaine with epinephrine was utilized for this. The 28 cm tip to cuff palindrome catheter was tunneled subcutaneously to the venotomy site and inserted into the SVC/RA junction through a valved peel-away sheath. Position was confirmed with fluoroscopy. Images were obtained for documentation. Blood was aspirated from the catheter followed by saline and heparin flushes. The appropriate volume and strength of heparin was instilled in each lumen. Caps were applied. The catheter was secured at the tunnel site with Gelfoam and a pursestring suture. The venotomy site was closed with subcuticular Vicryl suture. Dermabond was applied to the small right neck incision. A dry sterile dressing was applied. The catheter is ready for use. No immediate complications. IMPRESSION: Ultrasound and fluoroscopically guided left internal jugular tunneled hemodialysis catheter (28 cm tip to cuff palindrome catheter). Electronically Signed   By: Judie Petit.  Shick M.D.   On: 05/10/2016 17:14   Dg Chest Port 1 View  05/11/2016  CLINICAL DATA:  Congestive heart failure EXAM: PORTABLE CHEST 1 VIEW COMPARISON:  05/10/2016 FINDINGS: Endotracheal tube tip about 3.7 cm above the carina. Orogastric tube crosses the gastroesophageal junction. Stable right IJ central line. Persistent cardiac enlargement with hazy perihilar attenuation as well as left lower lobe opacity. Overall appearance very similar to prior study with mildly improved aeration at both lung bases. IMPRESSION: Pulmonary edema with asymmetric opacity left lung base suggesting consolidation related to asymmetric edema atelectasis or pneumonitis. Mild improvement in bi basilar  aeration. Electronically Signed   By: Esperanza Heir M.D.   On: 05/11/2016 08:06   STUDIES:  5/19 Echo >> mod LVH, EF 65 to 70%, grade 1 diastolic CHF 5/24 X-ray Neck >> Regressed density & stranding in neck. 5/24 CT neck w/o >> Airway in tact. No hematoma. Indentation of posterior pharynx by anatomy. Mild stranding right anterior chest & bilateral chin could be due to cellulitis or bruising.  MICROBIOLOGY: MRSA PCR 5/19:  Negative   ANTIBIOTICS: Ceftaz 5/30 >>  Vancomycin start date unknown>>>  SIGNIFICANT EVENTS: 5/19 Admit, cardiology consulted 5/21 Transfer to ICU with respiratory distress from Rt neck hematoma 5/22 Transferred back to SDU, tolerating  5/24- worseing status resp failure, back to ICU 5/26- Improving status transferred to SDU 5/30 transferred back to ICU 2/2 "resp distress/anxiety". Better on Bipap. 6/4 reintubated for hypoxemic respiratory failure and paralyzed for asynchrony.  LINES/TUBES: 5/19 Rt IJ CVL >> ETT 6/4>>> L IJ tunneled HD catheter 6/7>>>  ASSESSMENT / PLAN:  PULMONARY A: Acute Hypoxemic Hypercapnic Resp Fx 2/2 Exacerbation of COPD, HCAP bibasilar (L > R), VCD, likely with OSA (undiagnosed) P:   - Continue RR to 34 for mixed acidosis. - Titrate FiO2 to keep SpO2 > 92%, down to 50% and 8. - Breo d/c'ed 5/27 due to hoarseness/upper airway cough. - Duoneb PRN. - Pulmicort BID, brovana bid. - Continue solumedrol 40 mg IV q6  for now. - Continue abx. - D/Ced paralytics 6/6, no respiratory drive stil.  CARDIOVASCULAR A:  NSTEMI - Cath currently on hold. Hypertensive Urgency - BP better Acute on Chronic Diastolic CHF H/O HTN P:  - Holding ASA, heparin gtt in setting of hematoma >> might be able to resume soon - D/C lasix. - Renal services following. - Continue amlodipine, lipitor, hydralazine, (D/Ced) imdur - Defer timing of cardiac cath to cardiology. - Continue lopressor 12.5 mg PO BID.  RENAL A:   Acute on Chronic Renal Failure  Stage IV - Creatinine worsening. P:   - Monitoring electrolytes & renal function daily. - Replace electrolytes as indicated. - Renal consult appreciated. - Attempt to remove as much volume as possible given the fluid overload and difficulty with oxygenation. - KVO IVF. - CVP 17.  GASTROINTESTINAL A:   H/O GERD Questionable Aspiration P:   - TF per nutrition. - Protonix bid ac.   HEMATOLOGIC A:   Neck Hematoma - Resolving. Anemia - Mild. No obvious ongoing bleeding, HGB 10.3 ( 5/30) Thrombocytopenia P:  - Trending cell counts daily w/ CBC. - SCDs. - HIT panel pending, no anticoagulation however given frequent bleeding.  INFECTIOUS A:   CXR with Bibasilar infiltrates, L > R. Likely HCAP P:   - Day 10/14 fortaz. - Vanc. - F/U on cultures, NTD.  ENDOCRINE A:   Steroid induced hyperglycemia - BG controlled.  P:   - Accu-Checks q4hr. - SSI per resistant algorithm.  NEUROLOGIC A:   Anxiety D/O Sedate on vent H/O Depression  P: - Precedex off. - Fentanyl drip. - Vecuronium drip to off 6/6.  No family bedside but plan is as yesterday.    The patient is critically ill with multiple organ systems failure and requires high complexity decision making for assessment and support, frequent evaluation and titration of therapies, application of advanced monitoring technologies and extensive interpretation of multiple databases.   Critical Care Time devoted to patient care services described in this note is  36  Minutes. This time reflects time of care of this signee Dr Koren Bound. This critical care time does not reflect procedure time, or teaching time or supervisory time of PA/NP/Med student/Med Resident etc but could involve care discussion time.  Alyson Reedy, M.D. [facility] Pulmonary/Critical Care Medicine. Pager: [phone]. After hours pager: [phone].

## 2016-05-11 NOTE — Progress Notes (Signed)
Nutrition Follow-up  DOCUMENTATION CODES:   Morbid obesity  INTERVENTION:   Vital 1.5 @ 20 ml/hr 60 ml Prostat TID Provides: 1320 kcal, 122 grams protein, and 366 ml H2O.   NUTRITION DIAGNOSIS:   Inadequate oral intake related to inability to eat as evidenced by NPO status. Ongoing.   GOAL:   Provide needs based on ASPEN/SCCM guidelines Progressing.   MONITOR:   I & O's, Skin, Vent status, TF tolerance  ASSESSMENT:   Pt admitted with NSTEMI, HTN urgency, and AECOPD. She has hx of CKD and difficulty IV access. She had Rt IJ CVL placed 5/19 >> developed Rt neck hematoma with airway compromise and transferred to ICU.  Patient is currently intubated on ventilator support  Temp (24hrs), Avg:97.2 F (36.2 C), Min:96.4 F (35.8 C), Max:97.9 F (36.6 C)  Propofol: off Labs reviewed: sodium 134, K+5.9, BUN/Cr 147/3.63, PO4 7.5 CBG's: 164-177 Currently receiving IHD.  Severe edema.   Diet Order:  Diet NPO time specified  Skin:  Wound (see comment) (stage II sacrum, MASD perineum, blisters abd/groin)  Last BM:  6/8 - rectal pouch inserted   Height:   Ht Readings from Last 1 Encounters:  05/07/16 5\' 3"  (1.6 m)    Weight:   Wt Readings from Last 1 Encounters:  05/11/16 237 lb 3.4 oz (107.6 kg)    Ideal Body Weight:  52.2 kg  BMI:  Body mass index is 42.03 kg/(m^2).  Estimated Nutritional Needs:   Kcal:  1150-1305  Protein:  110-130 grams  Fluid:  >1.5 L/day  EDUCATION NEEDS:   No education needs identified at this time  Kendell Bane RD, LDN, CNSC [phone] Pager [phone] After Hours Pager

## 2016-05-11 NOTE — Progress Notes (Signed)
S: sedated O:BP 131/50 mmHg  Pulse 89  Temp(Src) 97.3 F (36.3 C) (Core (Comment))  Resp 34  Ht 5' 3"  (1.6 m)  Wt 107.9 kg (237 lb 14 oz)  BMI 42.15 kg/m2  SpO2 90%  Intake/Output Summary (Last 24 hours) at 05/11/16 7564 Last data filed at 05/11/16 0400  Gross per 24 hour  Intake 457.79 ml  Output   1600 ml  Net -1142.21 ml   Weight change: -0.2 kg (-7.1 oz) Gen: Sedated PPI:[id number],[id number] Resp: Scattered wheezes Abd:+ BS ND Ext: 1+ edema NEURO: Sedated   . amLODipine  10 mg Per Tube Daily  . antiseptic oral rinse  7 mL Mouth Rinse 10 times per day  . arformoterol  15 mcg Nebulization BID  . atorvastatin  40 mg Per Tube q1800  . budesonide (PULMICORT) nebulizer solution  0.25 mg Nebulization Q6H  . cefTAZidime (FORTAZ)  IV  1 g Intravenous Q24H  . chlorhexidine gluconate (SAGE KIT)  15 mL Mouth Rinse BID  . cloNIDine  0.1 mg Per Tube TID  . escitalopram  20 mg Per Tube Daily  . feeding supplement (PRO-STAT SUGAR FREE 64)  60 mL Per Tube QID  . feeding supplement (VITAL HIGH PROTEIN)  1,000 mL Per Tube Q24H  . furosemide  160 mg Intravenous Q8H  . hydrALAZINE  50 mg Per Tube Q8H  . insulin aspart  0-20 Units Subcutaneous Q4H  . ipratropium-albuterol  3 mL Nebulization Q6H  . isosorbide mononitrate  120 mg Oral Daily  . methylPREDNISolone (SOLU-MEDROL) injection  40 mg Intravenous Q6H  . metoprolol tartrate  12.5 mg Per Tube BID  . pantoprazole sodium  40 mg Per Tube Q24H  . sodium chloride flush  10-40 mL Intracatheter Q12H   Ct Head Wo Contrast  05/10/2016  CLINICAL DATA:  Altered mental status EXAM: CT HEAD WITHOUT CONTRAST TECHNIQUE: Contiguous axial images were obtained from the base of the skull through the vertex without intravenous contrast. COMPARISON:  January 19, 2013 FINDINGS: There is age related volume loss, stable. There is no intracranial mass, hemorrhage, extra-axial fluid collection, or midline shift. There is a focal area of decreased attenuation in the  lateral left lentiform nucleus, felt to represent a prior small infarct. Elsewhere gray-white compartments appear normal. No acute infarct evident. Bony calvarium appears intact. Mastoid air cells are clear. No intraorbital lesions are evident. There is mucosal thickening in the right frontal sinus. There is also an air-fluid level in the left sphenoid sinus region. 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 No acute infarct evident. No hemorrhage or mass effect. Areas of paranasal sinus disease. Electronically Signed   By: Lowella Grip III M.D.   On: 05/10/2016 17:24   Ir Fluoro Guide Cv Line Left  05/10/2016  INDICATION: Acute renal failure, no current access for dialysis. EXAM: ULTRASOUND GUIDANCE FOR VASCULAR ACCESS LEFT INTERNAL JUGULAR PERMANENT HEMODIALYSIS CATHETER Date:  6/7/20176/06/2016 4:55 pm Radiologist:  M. Daryll Brod, MD Guidance:  ULTRASOUND AND FLUOROSCOPIC FLUOROSCOPY TIME:  Fluoroscopy Time: 1Minutes 6 seconds (7 mGy). MEDICATIONS: PATIENT IS RECEIVING VANCOMYCIN AND CEFTAZ IS AS AN INPATIENT. ANESTHESIA/SEDATION: She is currently on fentanyl drip for sedation while on the ventilator. Moderate Sedation Time:  None. The patient was continuously monitored during the procedure by the interventional radiology nurse under my direct supervision. CONTRAST:  None. COMPLICATIONS: None immediate. PROCEDURE: Informed consent was obtained from the patient following explanation of the procedure, risks, benefits and alternatives. The patient understands, agrees and consents for  the procedure. All questions were addressed. A time out was performed. Maximal barrier sterile technique utilized including caps, mask, sterile gowns, sterile gloves, large sterile drape, hand hygiene, and 2% chlorhexidine scrub. Under sterile conditions and local anesthesia, left internal jugular micropuncture venous access was performed with ultrasound. Images were obtained for documentation. Imaging  confirms patency of the left internal jugular vein. A guide wire was inserted followed by a transitional dilator. Next, a 0.035 guidewire was advanced into the IVC with a 5-French catheter. Measurements were obtained from the left venotomy site to the proximal right atrium. In the left infraclavicular chest, a subcutaneous tunnel was created under sterile conditions and local anesthesia. 1% lidocaine with epinephrine was utilized for this. The 28 cm tip to cuff palindrome catheter was tunneled subcutaneously to the venotomy site and inserted into the SVC/RA junction through a valved peel-away sheath. Position was confirmed with fluoroscopy. Images were obtained for documentation. Blood was aspirated from the catheter followed by saline and heparin flushes. The appropriate volume and strength of heparin was instilled in each lumen. Caps were applied. The catheter was secured at the tunnel site with Gelfoam and a pursestring suture. The venotomy site was closed with subcuticular Vicryl suture. Dermabond was applied to the small right neck incision. A dry sterile dressing was applied. The catheter is ready for use. No immediate complications. IMPRESSION: Ultrasound and fluoroscopically guided left internal jugular tunneled hemodialysis catheter (28 cm tip to cuff palindrome catheter). Electronically Signed   By: Jerilynn Mages.  Shick M.D.   On: 05/10/2016 17:14   Ir US Guide Vasc Access Left  05/10/2016  INDICATION: Acute renal failure, no current access for dialysis. EXAM: ULTRASOUND GUIDANCE FOR VASCULAR ACCESS LEFT INTERNAL JUGULAR PERMANENT HEMODIALYSIS CATHETER Date:  6/7/20176/06/2016 4:55 pm Radiologist:  M. Daryll Brod, MD Guidance:  ULTRASOUND AND FLUOROSCOPIC FLUOROSCOPY TIME:  Fluoroscopy Time: 1Minutes 6 seconds (7 mGy). MEDICATIONS: PATIENT IS RECEIVING VANCOMYCIN AND CEFTAZ IS AS AN INPATIENT. ANESTHESIA/SEDATION: She is currently on fentanyl drip for sedation while on the ventilator. Moderate Sedation Time:  None.  The patient was continuously monitored during the procedure by the interventional radiology nurse under my direct supervision. CONTRAST:  None. COMPLICATIONS: None immediate. PROCEDURE: Informed consent was obtained from the patient following explanation of the procedure, risks, benefits and alternatives. The patient understands, agrees and consents for the procedure. All questions were addressed. A time out was performed. Maximal barrier sterile technique utilized including caps, mask, sterile gowns, sterile gloves, large sterile drape, hand hygiene, and 2% chlorhexidine scrub. Under sterile conditions and local anesthesia, left internal jugular micropuncture venous access was performed with ultrasound. Images were obtained for documentation. Imaging confirms patency of the left internal jugular vein. A guide wire was inserted followed by a transitional dilator. Next, a 0.035 guidewire was advanced into the IVC with a 5-French catheter. Measurements were obtained from the left venotomy site to the proximal right atrium. In the left infraclavicular chest, a subcutaneous tunnel was created under sterile conditions and local anesthesia. 1% lidocaine with epinephrine was utilized for this. The 28 cm tip to cuff palindrome catheter was tunneled subcutaneously to the venotomy site and inserted into the SVC/RA junction through a valved peel-away sheath. Position was confirmed with fluoroscopy. Images were obtained for documentation. Blood was aspirated from the catheter followed by saline and heparin flushes. The appropriate volume and strength of heparin was instilled in each lumen. Caps were applied. The catheter was secured at the tunnel site with Gelfoam and a pursestring suture. The  venotomy site was closed with subcuticular Vicryl suture. Dermabond was applied to the small right neck incision. A dry sterile dressing was applied. The catheter is ready for use. No immediate complications. IMPRESSION: Ultrasound and  fluoroscopically guided left internal jugular tunneled hemodialysis catheter (28 cm tip to cuff palindrome catheter). Electronically Signed   By: Jerilynn Mages.  Shick M.D.   On: 05/10/2016 17:14   Dg Chest Port 1 View  05/11/2016  CLINICAL DATA:  Congestive heart failure EXAM: PORTABLE CHEST 1 VIEW COMPARISON:  05/10/2016 FINDINGS: Endotracheal tube tip about 3.7 cm above the carina. Orogastric tube crosses the gastroesophageal junction. Stable right IJ central line. Persistent cardiac enlargement with hazy perihilar attenuation as well as left lower lobe opacity. Overall appearance very similar to prior study with mildly improved aeration at both lung bases. IMPRESSION: Pulmonary edema with asymmetric opacity left lung base suggesting consolidation related to asymmetric edema atelectasis or pneumonitis. Mild improvement in bi basilar aeration. Electronically Signed   By: Skipper Cliche M.D.   On: 05/11/2016 08:06   Dg Chest Port 1 View  05/10/2016  CLINICAL DATA:  Intubated patient, respiratory failure, COPD, NSTEMI, chronic CHF, acute and chronic renal failure. EXAM: PORTABLE CHEST 1 VIEW COMPARISON:  Portable chest x-ray of May 09, 2016 FINDINGS: The patient is rotated toward the left. The lungs are reasonably well inflated. Persistent increased interstitial density is present throughout the right lung and at the left lung base. There is persistent obscuration of the left hemidiaphragm. The cardiac silhouette is mildly enlarged but stable. The pulmonary vascularity is indistinct. The endotracheal tube tip lies 5.2 cm above the carina. The right internal jugular venous catheter tip projects over the midportion of the SVC. The esophagogastric tube tip projects below the inferior margin of the image. IMPRESSION: Persistent increased interstitial densities within the right lung and at the left lung base compatible with pulmonary edema. Left lower lobe atelectasis or pneumonia with small pleural effusion is stable. The  support tubes are in reasonable position. Electronically Signed   By: David  Martinique M.D.   On: 05/10/2016 07:32   BMET    Component Value Date/Time   NA 134* 05/11/2016 0432   K 5.9* 05/11/2016 0432   CL 99* 05/11/2016 0432   CO2 21* 05/11/2016 0432   GLUCOSE 147* 05/11/2016 0432   BUN 147* 05/11/2016 0432   CREATININE 3.63* 05/11/2016 0432   CREATININE 1.98* 01/05/2016 0923   CALCIUM 8.1* 05/11/2016 0432   GFRNONAA 11* 05/11/2016 0432   GFRAA 13* 05/11/2016 0432   CBC    Component Value Date/Time   WBC 16.4* 05/11/2016 0432   RBC 3.18* 05/11/2016 0432   HGB 10.6* 05/11/2016 0432   HCT 31.8* 05/11/2016 0432   PLT 83* 05/11/2016 0432   MCV 100.0 05/11/2016 0432   MCH 33.3 05/11/2016 0432   MCHC 33.3 05/11/2016 0432   RDW 16.8* 05/11/2016 0432   LYMPHSABS 0.4* 05/11/2016 0432   MONOABS 0.4 05/11/2016 0432   EOSABS 0.0 05/11/2016 0432   BASOSABS 0.0 05/11/2016 0432     Assessment: 1.  Acute on CKD 3 most likely hemodynamically mediated due to low BP post complication from line placement. 2. CKD 3 sec HTN 3. COPD 4. ? PNA 5. NSTEMI 6. Mild hyperkalemia 7. thrombocytopenia Plan: 1.  Will plan HD again today 2. DC lasix 3. Daily labs  Deborah Dondero T

## 2016-05-11 NOTE — Progress Notes (Signed)
SUBJECTIVE:  Intermittent AFib.  OBJECTIVE:   Vitals:   Filed Vitals:   05/11/16 0735 05/11/16 0800 05/11/16 0852 05/11/16 0900  BP:   185/56   Pulse:  91 96 94  Temp:  97.3 F (36.3 C)  97.2 F (36.2 C)  TempSrc:  Core (Comment)    Resp:  30 34 30  Height:      Weight:      SpO2: 90% 97% 97% 95%   I&O's:    Intake/Output Summary (Last 24 hours) at 05/11/16 0957 Last data filed at 05/11/16 0900  Gross per 24 hour  Intake 715.83 ml  Output   1600 ml  Net -884.17 ml   TELEMETRY: Reviewed telemetry pt in Atrial fibrillation:     PHYSICAL EXAM General: Well developed, well nourished, in no acute distress Head:   Normal cephalic and atramatic  Lungs:   Coarse breath sounds bilaterally to auscultation. Heart:   Irregularly irregular S1 S2  No JVD.   Abdomen: abdomen soft and non-tender Msk:  Back normal,  Normal strength and tone for age. Extremities:  1+ bilateral edema.   Neuro: Alert and oriented. Psych:  Normal affect, responds appropriately Skin: No rash   LABS: Basic Metabolic Panel:  Recent Labs  17/00/17 0406 05/11/16 0432  NA 132* 134*  K 5.6* 5.9*  CL 98* 99*  CO2 19* 21*  GLUCOSE 173* 147*  BUN 172* 147*  CREATININE 4.08* 3.63*  CALCIUM 7.9* 8.1*  MG 2.7* 2.7*  PHOS 7.8* 7.5*   Liver Function Tests:  Recent Labs  05/11/16 0432  AST 65*  ALT 71*  ALKPHOS 184*  BILITOT 1.7*  PROT 5.2*  ALBUMIN 2.5*   No results for input(s): LIPASE, AMYLASE in the last 72 hours. CBC:  Recent Labs  05/10/16 0406 05/11/16 0432  WBC 15.8* 16.4*  NEUTROABS  --  15.6*  HGB 9.7* 10.6*  HCT 30.8* 31.8*  MCV 100.0 100.0  PLT 72* 83*   Cardiac Enzymes: No results for input(s): CKTOTAL, CKMB, CKMBINDEX, TROPONINI in the last 72 hours. BNP: Invalid input(s): POCBNP D-Dimer: No results for input(s): DDIMER in the last 72 hours. Hemoglobin A1C: No results for input(s): HGBA1C in the last 72 hours. Fasting Lipid Panel:  Recent Labs   05/11/16 0432  TRIG 245*   Thyroid Function Tests: No results for input(s): TSH, T4TOTAL, T3FREE, THYROIDAB in the last 72 hours.  Invalid input(s): FREET3 Anemia Panel: No results for input(s): VITAMINB12, FOLATE, FERRITIN, TIBC, IRON, RETICCTPCT in the last 72 hours. Coag Panel:   Lab Results  Component Value Date   INR 1.33 05/10/2016   INR 1.13 04/23/2016   INR 1.17 04/27/2016    RADIOLOGY: Dg Neck Soft Tissue  04/26/2016  CLINICAL DATA:  75 year old female with stridor, wheezing. Initial encounter. EXAM: NECK SOFT TISSUES - 1+ VIEW COMPARISON:  04/23/2016 neck x-rays and earlier. FINDINGS: Right IJ approach venous catheter remains in place, tip not included. On the lateral view of the neck there is abnormal submental/ anterior neck subcutaneous stranding (arrow). The leftward tracheal deviation seen previously has regressed. The abnormal prevertebral soft tissue contour demonstrated on 04/23/2016 has regressed but not completely normalized (13 mm at the C2 level versus normal of 58mm or less). Mass effect on the pharynx has regressed but not resolved. No subcutaneous gas identified. No pneumothorax in the visible lung apices. IMPRESSION: Regressed but not resolved abnormal density and stranding in the neck which probably reflects resolving trans-spatial hematoma in this setting. Neck  CT (IV contrast preferred) may confirm as needed. Electronically Signed   By: Odessa Fleming M.D.   On: 04/26/2016 13:18   Dg Neck Soft Tissue  04/23/2016  CLINICAL DATA:  Right neck pain/ swelling EXAM: NECK SOFT TISSUES - 1+ VIEW COMPARISON:  None. FINDINGS: Prevertebral soft tissues anterior to the upper cervical spine are markedly enlarged on the lateral view, indicating prevertebral edema. On the frontal radiograph, the trachea is deviated to the left. IMPRESSION: Suspected prevertebral edema with leftward tracheal deviation. CT neck with contrast is suggested for further characterization. Electronically Signed    By: Charline Bills M.D.   On: 04/23/2016 09:25   Dg Chest 1 View  04/29/2016  CLINICAL DATA:  Central line placement EXAM: CHEST 1 VIEW COMPARISON:  04/18/2016 chest radiograph. FINDINGS: Right internal jugular central venous catheter terminates in the middle third of the superior vena cava. Stable cardiomediastinal silhouette with top-normal heart size. No pneumothorax. No right pleural effusion. No overt pulmonary edema. Small left pleural effusion, probably stable. Mild left basilar atelectasis. IMPRESSION: 1. Right internal jugular central venous catheter terminates in the middle third of the superior vena cava. No pneumothorax. 2. Stable small left pleural effusion with mild left basilar atelectasis. Electronically Signed   By: Delbert Phenix M.D.   On: 04/30/2016 15:48   Ct Head Wo Contrast  05/10/2016  CLINICAL DATA:  Altered mental status EXAM: CT HEAD WITHOUT CONTRAST TECHNIQUE: Contiguous axial images were obtained from the base of the skull through the vertex without intravenous contrast. COMPARISON:  January 19, 2013 FINDINGS: There is age related volume loss, stable. There is no intracranial mass, hemorrhage, extra-axial fluid collection, or midline shift. There is a focal area of decreased attenuation in the lateral left lentiform nucleus, felt to represent a prior small infarct. Elsewhere gray-white compartments appear normal. No acute infarct evident. Bony calvarium appears intact. Mastoid air cells are clear. No intraorbital lesions are evident. There is mucosal thickening in the right frontal sinus. There is also an air-fluid level in the left sphenoid sinus region. 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 No acute infarct evident. No hemorrhage or mass effect. Areas of paranasal sinus disease. Electronically Signed   By: Bretta Bang III M.D.   On: 05/10/2016 17:24   Ct Soft Tissue Neck Wo Contrast  04/26/2016  CLINICAL DATA:  Hematoma right neck. Right  shoulder arthroplasty 04/16/2015 EXAM: CT NECK WITHOUT CONTRAST TECHNIQUE: Multidetector CT imaging of the neck was performed following the standard protocol without intravenous contrast. COMPARISON:  Neck radiographs 04/26/2016 FINDINGS: Pharynx and larynx: Tongue is normal. Tonsils are normal. Nasopharynx normal. There is tortuosity of the right internal carotid indenting the posterior wall of the oropharynx. Some of this may account for prevertebral soft tissue thickening noted on x-ray. Prevertebral soft tissues measure up 12 mm in thickness on the CT but no mass or hematoma identified. Marked improvement in prevertebral soft tissue swelling since the x-ray of 04/23/2016. This may be due to resolving hematoma. Airway intact. Larynx normal. Salivary glands: Prominent parotid glands bilaterally which are fatty replaced. Submandibular glands normal bilaterally. Thyroid: Negative Lymph nodes: Negative for adenopathy in the neck. Vascular: Atherosclerotic calcification in the carotid bifurcation bilaterally. Right internal carotid artery is tortuous with retropharyngeal course indenting the posterior oropharynx. Right jugular central venous catheter in good position. No hematoma at the catheter site. Limited intracranial: Negative Visualized orbits: Not imaged Mastoids and visualized paranasal sinuses: Small air-fluid level in the sphenoid sinus. Remaining paranasal sinuses  clear. Mastoid sinus clear bilaterally. Skeleton: Mild disc degeneration and spurring in the thoracic spine. No fracture or bone lesion. Advanced degenerative change in the left TMJ and moderate degenerative change in the right TMJ. Upper chest: Mild stranding in the right upper chest subcutaneous fat. There is also similar stranding in the subcutaneous fat of the chin bilaterally left greater than right. These areas may be due to bruising or cellulitis. No frank hematoma identified. Lung apices clear. Prominent extrapleural fat in the lung apices  posteriorly bilaterally. IMPRESSION: The airway is  intact.  No hematoma is identified. Prevertebral soft tissue swelling due to large patient size and medial deviation of the right internal carotid artery causing indentation of the posterior oropharynx. Currently no hematoma. Marked prevertebral soft tissue swelling on 04/23/2016 May have been edema or hematoma which has resolved. Mild stranding in the right anterior chest and bilateral chin may be due to cellulitis or bruising. Electronically Signed   By: Marlan Palau M.D.   On: 04/26/2016 16:08   Ir Fluoro Guide Cv Line Left  05/10/2016  INDICATION: Acute renal failure, no current access for dialysis. EXAM: ULTRASOUND GUIDANCE FOR VASCULAR ACCESS LEFT INTERNAL JUGULAR PERMANENT HEMODIALYSIS CATHETER Date:  6/7/20176/06/2016 4:55 pm Radiologist:  M. Ruel Favors, MD Guidance:  ULTRASOUND AND FLUOROSCOPIC FLUOROSCOPY TIME:  Fluoroscopy Time: 6 seconds (7 mGy). MEDICATIONS: PATIENT IS RECEIVING VANCOMYCIN AND CEFTAZ IS AS AN INPATIENT. ANESTHESIA/SEDATION: She is currently on fentanyl drip for sedation while on the ventilator. Moderate Sedation Time:  None. The patient was continuously monitored during the procedure by the interventional radiology nurse under my direct supervision. CONTRAST:  None. COMPLICATIONS: None immediate. PROCEDURE: Informed consent was obtained from the patient following explanation of the procedure, risks, benefits and alternatives. The patient understands, agrees and consents for the procedure. All questions were addressed. A time out was performed. Maximal barrier sterile technique utilized including caps, mask, sterile gowns, sterile gloves, large sterile drape, hand hygiene, and 2% chlorhexidine scrub. Under sterile conditions and local anesthesia, left internal jugular micropuncture venous access was performed with ultrasound. Images were obtained for documentation. Imaging confirms patency of the left internal jugular  vein. A guide wire was inserted followed by a transitional dilator. Next, a 0.035 guidewire was advanced into the IVC with a 5-French catheter. Measurements were obtained from the left venotomy site to the proximal right atrium. In the left infraclavicular chest, a subcutaneous tunnel was created under sterile conditions and local anesthesia. 1% lidocaine with epinephrine was utilized for this. The 28 cm tip to cuff palindrome catheter was tunneled subcutaneously to the venotomy site and inserted into the SVC/RA junction through a valved peel-away sheath. Position was confirmed with fluoroscopy. Images were obtained for documentation. Blood was aspirated from the catheter followed by saline and heparin flushes. The appropriate volume and strength of heparin was instilled in each lumen. Caps were applied. The catheter was secured at the tunnel site with Gelfoam and a pursestring suture. The venotomy site was closed with subcuticular Vicryl suture. Dermabond was applied to the small right neck incision. A dry sterile dressing was applied. The catheter is ready for use. No immediate complications. IMPRESSION: Ultrasound and fluoroscopically guided left internal jugular tunneled hemodialysis catheter (28 cm tip to cuff palindrome catheter). Electronically Signed   By: Judie Petit.  Shick M.D.   On: 05/10/2016 17:14   Ir US Guide Vasc Access Left  05/10/2016  INDICATION: Acute renal failure, no current access for dialysis. EXAM: ULTRASOUND GUIDANCE FOR VASCULAR ACCESS LEFT  INTERNAL JUGULAR PERMANENT HEMODIALYSIS CATHETER Date:  6/7/20176/06/2016 4:55 pm Radiologist:  M. Ruel Favors, MD Guidance:  ULTRASOUND AND FLUOROSCOPIC FLUOROSCOPY TIME:  Fluoroscopy Time: 6 seconds (7 mGy). MEDICATIONS: PATIENT IS RECEIVING VANCOMYCIN AND CEFTAZ IS AS AN INPATIENT. ANESTHESIA/SEDATION: She is currently on fentanyl drip for sedation while on the ventilator. Moderate Sedation Time:  None. The patient was continuously monitored during  the procedure by the interventional radiology nurse under my direct supervision. CONTRAST:  None. COMPLICATIONS: None immediate. PROCEDURE: Informed consent was obtained from the patient following explanation of the procedure, risks, benefits and alternatives. The patient understands, agrees and consents for the procedure. All questions were addressed. A time out was performed. Maximal barrier sterile technique utilized including caps, mask, sterile gowns, sterile gloves, large sterile drape, hand hygiene, and 2% chlorhexidine scrub. Under sterile conditions and local anesthesia, left internal jugular micropuncture venous access was performed with ultrasound. Images were obtained for documentation. Imaging confirms patency of the left internal jugular vein. A guide wire was inserted followed by a transitional dilator. Next, a 0.035 guidewire was advanced into the IVC with a 5-French catheter. Measurements were obtained from the left venotomy site to the proximal right atrium. In the left infraclavicular chest, a subcutaneous tunnel was created under sterile conditions and local anesthesia. 1% lidocaine with epinephrine was utilized for this. The 28 cm tip to cuff palindrome catheter was tunneled subcutaneously to the venotomy site and inserted into the SVC/RA junction through a valved peel-away sheath. Position was confirmed with fluoroscopy. Images were obtained for documentation. Blood was aspirated from the catheter followed by saline and heparin flushes. The appropriate volume and strength of heparin was instilled in each lumen. Caps were applied. The catheter was secured at the tunnel site with Gelfoam and a pursestring suture. The venotomy site was closed with subcuticular Vicryl suture. Dermabond was applied to the small right neck incision. A dry sterile dressing was applied. The catheter is ready for use. No immediate complications. IMPRESSION: Ultrasound and fluoroscopically guided left internal jugular  tunneled hemodialysis catheter (28 cm tip to cuff palindrome catheter). Electronically Signed   By: Judie Petit.  Shick M.D.   On: 05/10/2016 17:14   Dg Chest Port 1 View  05/11/2016  CLINICAL DATA:  Congestive heart failure EXAM: PORTABLE CHEST 1 VIEW COMPARISON:  05/10/2016 FINDINGS: Endotracheal tube tip about 3.7 cm above the carina. Orogastric tube crosses the gastroesophageal junction. Stable right IJ central line. Persistent cardiac enlargement with hazy perihilar attenuation as well as left lower lobe opacity. Overall appearance very similar to prior study with mildly improved aeration at both lung bases. IMPRESSION: Pulmonary edema with asymmetric opacity left lung base suggesting consolidation related to asymmetric edema atelectasis or pneumonitis. Mild improvement in bi basilar aeration. Electronically Signed   By: Esperanza Heir M.D.   On: 05/11/2016 08:06   Dg Chest Port 1 View  05/10/2016  CLINICAL DATA:  Intubated patient, respiratory failure, COPD, NSTEMI, chronic CHF, acute and chronic renal failure. EXAM: PORTABLE CHEST 1 VIEW COMPARISON:  Portable chest x-ray of May 09, 2016 FINDINGS: The patient is rotated toward the left. The lungs are reasonably well inflated. Persistent increased interstitial density is present throughout the right lung and at the left lung base. There is persistent obscuration of the left hemidiaphragm. The cardiac silhouette is mildly enlarged but stable. The pulmonary vascularity is indistinct. The endotracheal tube tip lies 5.2 cm above the carina. The right internal jugular venous catheter tip projects over the midportion of the SVC.  The esophagogastric tube tip projects below the inferior margin of the image. IMPRESSION: Persistent increased interstitial densities within the right lung and at the left lung base compatible with pulmonary edema. Left lower lobe atelectasis or pneumonia with small pleural effusion is stable. The support tubes are in reasonable position.  Electronically Signed   By: David  [place] M.D.   On: 05/10/2016 07:32   Dg Chest Port 1 View  05/09/2016  CLINICAL DATA:  Hypoxia EXAM: PORTABLE CHEST 1 VIEW COMPARISON:  May 08, 2016 FINDINGS: Endotracheal tube tip is 2.3 cm above the carina. Nasogastric tube tip and side port are below the diaphragm. Central catheter tip is in the superior vena cava. No pneumothorax. There is patchy airspace consolidation in both upper lobes as well as in both the lung base regions. There is evidence of interstitial edema in the lung bases. There is a small left pleural effusion. There is cardiomegaly with mild pulmonary venous hypertension. No adenopathy evident. IMPRESSION: Tube and catheter positions as described without pneumothorax. Evidence of a degree of congestive heart failure. Airspace opacity is also noted in the upper lobes and bases which may represent alveolar edema or superimposed pneumonia. Both entities may exist concurrently. Electronically Signed   By: Bretta Bang III M.D.   On: 05/09/2016 07:13   Dg Chest Port 1 View  05/08/2016  CLINICAL DATA:  Respiratory failure with pneumonia, history of COPD, previous MI, CHF, acute and chronic renal failure. EXAM: PORTABLE CHEST 1 VIEW COMPARISON:  Portable chest x-ray of May 07, 2016 FINDINGS: There is persistent alveolar opacity in the right upper lobe with lesser amounts in both lower lobes. The left hemidiaphragm remains partially obscured. The cardiac silhouette is enlarged but stable. The pulmonary vascularity is mildly prominent centrally. There is no large pleural effusion. There is no pneumothorax. The endotracheal tube tip lies 2.7 cm above the carina. The esophagogastric tube tip projects below the inferior margin of the image. The right internal jugular venous catheter tip projects over the junction of the proximal and midportions of the SVC. IMPRESSION: Slight interval deterioration in the appearance of the left lower lung where there is infiltrate  or atelectasis and small pleural effusion. Slight increased density at the right lung base without significant pleural effusion. Persistent alveolar opacities in the right upper lobe compatible with pneumonia. Electronically Signed   By: David  [place] M.D.   On: 05/08/2016 07:15   Dg Chest Port 1 View  05/07/2016  CLINICAL DATA:  Status post intubation EXAM: PORTABLE CHEST 1 VIEW COMPARISON:  05/07/2016 FINDINGS: There is a ET tube with tip above the carina. Right IJ catheter tip is in the projection of the SVC. There is a nasogastric tube with tip below the field of view. Bilateral upper lobe and left lower lobe airspace opacities are unchanged from previous exam. IMPRESSION: 1. No change in bilateral airspace opacities compatible with multifocal infection. 2. ET tube tip is in satisfactory position above the carina. Electronically Signed   By: Signa Kell M.D.   On: 05/07/2016 13:41   Dg Chest Port 1 View  05/07/2016  CLINICAL DATA:  Respiratory distress EXAM: PORTABLE CHEST 1 VIEW COMPARISON:  05/05/2016 FINDINGS: Right central line remains in place, unchanged. Stable airspace opacities in the left mid and lower lung. New airspace opacity in the right upper lobe. Findings compatible with multifocal pneumonia. Heart is borderline in size. No effusions or acute bony abnormality. IMPRESSION: Stable patchy left lung airspace disease within the right upper lobe consolidation.  Electronically Signed   By: Charlett Nose M.D.   On: 05/07/2016 07:09   Dg Chest Port 1 View  05/05/2016  CLINICAL DATA:  Acute respiratory failure and pneumonia, history of COPD, CHF and MI, acute renal failure. EXAM: PORTABLE CHEST 1 VIEW COMPARISON:  Portable chest x-ray of May 03, 2016. FINDINGS: The lungs are well-expanded. Confluent alveolar opacity in the left mid lung is slightly less conspicuous today. There is a trace of pleural fluid blunting the left lateral costophrenic angle. The cardiac silhouette is mildly enlarged. The  central pulmonary vascularity is prominent. The right internal jugular venous catheter tip projects over the midportion of the SVC. IMPRESSION: Slight interval improvement in the appearance of the airspace opacities in the left mid lung but significant abnormality remains. Stable mild interstitial prominence diffusely. Stable cardiomegaly. Electronically Signed   By: David  [place] M.D.   On: 05/05/2016 07:33   Dg Chest Port 1 View  05/03/2016  CLINICAL DATA:  Followup exam for respiratory failure air. Increased wheezing this morning. History of COPD. EXAM: PORTABLE CHEST 1 VIEW COMPARISON:  05/02/2016 FINDINGS: Air space consolidation, most evident in the left mid lung, is similar to the previous day's study allowing for differences patient positioning and technique. No new lung abnormalities. No convincing pleural effusion. No pneumothorax. Right internal jugular central venous line is stable with its tip in the mid superior vena cava. IMPRESSION: 1. No significant change from the previous day's study. 2. Persistent airspace lung opacity, greatest on the left, consistent with pneumonia. Electronically Signed   By: Amie Portland M.D.   On: 05/03/2016 09:09   Dg Chest Port 1 View  05/02/2016  CLINICAL DATA:  Acute onset shortness of breath today. COPD with acute exacerbation. Myocardial infarct. Stage 4 chronic kidney disease. EXAM: PORTABLE CHEST 1 VIEW COMPARISON:  04/26/2016 FINDINGS: New asymmetric airspace disease is seen involving the left mid and lower lung, and the right lung base to lesser extent. This may be due to asymmetric edema or infection. No definite evidence pleural effusion. Heart size remains normal. Right jugular central venous catheter remains in appropriate position. No pneumothorax visualized. IMPRESSION: New asymmetric airspace disease involving left lung greater than right. Differential diagnosis includes asymmetric edema and infection. Electronically Signed   By: Myles Rosenthal M.D.    On: 05/02/2016 14:00   Dg Chest Port 1 View  04/26/2016  CLINICAL DATA:  75 year old female with stridor and wheezing. Initial encounter. EXAM: PORTABLE CHEST 1 VIEW COMPARISON:  0828 hours today and earlier. FINDINGS: Portable AP semi upright view at 1243 hours. Stable right IJ central line, tip at the level of the aortic arch, cephalad SVC. Stable cardiac size and mediastinal contours. No pneumothorax or pulmonary edema. No consolidation. Left lung base ventilation is stable. No new pulmonary opacity. IMPRESSION: Stable ventilation since 0828 hours today. No new cardiopulmonary abnormality. Electronically Signed   By: Odessa Fleming M.D.   On: 04/26/2016 13:20   Dg Chest Port 1 View  04/26/2016  CLINICAL DATA:  Acute respiratory failure with hypoxia EXAM: PORTABLE CHEST - 1 VIEW COMPARISON:  04/12/2016 FINDINGS: Stable right IJ central venous catheter. Lungs are clear. Improved aeration of the left lung base. Heart size and mediastinal contours are within normal limits. No effusion evident. Right shoulder arthroplasty hardware partially visualized. IMPRESSION: Improved aeration at the left lung base. Electronically Signed   By: Corlis Leak M.D.   On: 04/26/2016 08:45   Dg Chest Port 1 View  05/01/2016  CLINICAL DATA:  Respiratory failure. EXAM: PORTABLE CHEST 1 VIEW COMPARISON:  04/24/2016. FINDINGS: Right IJ line in stable position. Mild left base atelectasis and/or infiltrate. Small left pleural effusion. No pneumothorax. Heart size normal. Right shoulder replacement. IMPRESSION: 1.  Right IJ line in stable position. 2. Mild left base atelectasis and/or infiltrate. Small left pleural effusion. No interim change. Electronically Signed   By: Maisie Fus  Register   On: 2016/05/23 07:13      ASSESSMENT: AFib- paroxysmal  PLAN:  Started toprol.  No Heparin due to neck hematoma and need for transfusion.  Had some bradycardia yesterday with heart rates down in the 50s. For now, continue 12.5 mg twice a day. If her  heart rate started to increase, would increase metoprolol to 25 mg twice a day.  Dialysis for fluid removal and then hopefully weaning of respiratory support.  Corky Crafts, MD  05/11/2016  9:57 AM

## 2016-05-11 NOTE — Progress Notes (Signed)
eLink Physician-Brief Progress Note Patient Name: Ranesha Grindstaff DOB: Nov 13, 1941 MRN: [id number]   Date of Service  05/11/2016  HPI/Events of Note  SBP>200, sedation drops pressure  eICU Interventions  Will start hydralazine PRN, may need to consider low dose pain regimen     Intervention Category Major Interventions: Hypertension - evaluation and management  Mariem Skolnick 05/11/2016, 4:54 AM

## 2016-05-11 NOTE — Progress Notes (Signed)
eLink Physician-Brief Progress Note Patient Name: [name] [name] DOB: [birth year] MRN: [id number]   Date of Service  05/11/2016  HPI/Events of Note  Multiple episodes of loose stool with skin breakdown starting.  eICU Interventions  Will order a Flexiseal.      Intervention Category Intermediate Interventions: Other:  [name] 05/11/2016, 4:19 PM

## 2016-05-11 NOTE — Care Management Important Message (Signed)
Important Message  Patient Details  Name: [name] [name] MRN: [id number] Date of Birth: Nov 07, 1941   Medicare Important Message Given:  Yes    Bernadette Hoit 05/11/2016, 9:49 AM

## 2016-05-11 NOTE — Progress Notes (Addendum)
200cc of fentanyl wasted in sink with Garnette Czech, RN.

## 2016-05-12 ENCOUNTER — Encounter (HOSPITAL_COMMUNITY): Payer: Medicare Other

## 2016-05-12 ENCOUNTER — Inpatient Hospital Stay (HOSPITAL_COMMUNITY): Payer: Medicare Other

## 2016-05-12 DIAGNOSIS — R4020 Unspecified coma: Secondary | ICD-10-CM

## 2016-05-12 DIAGNOSIS — I480 Paroxysmal atrial fibrillation: Secondary | ICD-10-CM | POA: Insufficient documentation

## 2016-05-12 LAB — RENAL FUNCTION PANEL
ANION GAP: 15 (ref 5–15)
Albumin: 2.1 g/dL — ABNORMAL LOW (ref 3.5–5.0)
BUN: 155 mg/dL — ABNORMAL HIGH (ref 6–20)
CHLORIDE: 96 mmol/L — AB (ref 101–111)
CO2: 22 mmol/L (ref 22–32)
Calcium: 7.7 mg/dL — ABNORMAL LOW (ref 8.9–10.3)
Creatinine, Ser: 3.58 mg/dL — ABNORMAL HIGH (ref 0.44–1.00)
GFR, EST AFRICAN AMERICAN: 14 mL/min — AB (ref >60)
GFR, EST NON AFRICAN AMERICAN: 12 mL/min — AB (ref >60)
Glucose, Bld: 241 mg/dL — ABNORMAL HIGH (ref 65–99)
POTASSIUM: 5.5 mmol/L — AB (ref 3.5–5.1)
Phosphorus: 8 mg/dL — ABNORMAL HIGH (ref 2.5–4.6)
Sodium: 133 mmol/L — ABNORMAL LOW (ref 135–145)

## 2016-05-12 LAB — CBC
HEMATOCRIT: 26.9 % — AB (ref 36.0–46.0)
Hemoglobin: 8.7 g/dL — ABNORMAL LOW (ref 12.0–15.0)
MCH: 31.9 pg (ref 26.0–34.0)
MCHC: 32.3 g/dL (ref 30.0–36.0)
MCV: 98.5 fL (ref 78.0–100.0)
PLATELETS: 87 10*3/uL — AB (ref 150–400)
RBC: 2.73 MIL/uL — ABNORMAL LOW (ref 3.87–5.11)
RDW: 16.6 % — AB (ref 11.5–15.5)
WBC: 12.3 10*3/uL — AB (ref 4.0–10.5)

## 2016-05-12 LAB — BLOOD GAS, ARTERIAL
Acid-base deficit: 5.1 mmol/L — ABNORMAL HIGH (ref 0.0–2.0)
BICARBONATE: 21 meq/L (ref 20.0–24.0)
Drawn by: 39898
FIO2: 0.5
O2 Saturation: 97.8 %
PATIENT TEMPERATURE: 98.6
PCO2 ART: 50.2 mmHg — AB (ref 35.0–45.0)
PEEP: 8 cmH2O
RATE: 34 resp/min
TCO2: 22.5 mmol/L (ref 0–100)
VT: 420 mL
pH, Arterial: 7.245 — ABNORMAL LOW (ref 7.350–7.450)
pO2, Arterial: 115 mmHg — ABNORMAL HIGH (ref 80.0–100.0)

## 2016-05-12 LAB — MAGNESIUM: Magnesium: 2.4 mg/dL (ref 1.7–2.4)

## 2016-05-12 LAB — GLUCOSE, CAPILLARY
GLUCOSE-CAPILLARY: 203 mg/dL — AB (ref 65–99)
GLUCOSE-CAPILLARY: 152 mg/dL — AB (ref 65–99)
GLUCOSE-CAPILLARY: 151 mg/dL — AB (ref 65–99)
Glucose-Capillary: 157 mg/dL — ABNORMAL HIGH (ref 65–99)
Glucose-Capillary: 202 mg/dL — ABNORMAL HIGH (ref 65–99)
Glucose-Capillary: 277 mg/dL — ABNORMAL HIGH (ref 65–99)

## 2016-05-12 LAB — CULTURE, RESPIRATORY W GRAM STAIN

## 2016-05-12 LAB — CULTURE, RESPIRATORY

## 2016-05-12 MED ORDER — METHYLPREDNISOLONE SODIUM SUCC 40 MG IJ SOLR
40.0000 mg | Freq: Two times a day (BID) | INTRAMUSCULAR | Status: DC
  Administered 2016-05-12 – 2016-05-13 (×2): 40 mg via INTRAVENOUS
  Filled 2016-05-12 (×2): qty 1

## 2016-05-12 NOTE — Progress Notes (Signed)
On assessment two of three ports on TL IJ will not flush (one known and capped previously). IV team paged, line clotted and unable to TPA since port will not flush. [facility] paged. Patient very difficult IV access, will readdress in the morning and monitor with patient status. Will continue to monitor.

## 2016-05-12 NOTE — Progress Notes (Signed)
PULMONARY / CRITICAL CARE MEDICINE   Name: [name] [name] MRN: [id number] DOB: 1941/02/02    ADMISSION DATE:  05-20-16 CONSULTATION DATE:  04/23/2016  REFERRING MD:  Viann Fish, Teodora Medici  CHIEF COMPLAINT:  Can't breath  Brief Profile  75 yo obese wf quit smoking one year pta  admitted with NSTEMI, HTN urgency, and AECOPD. She has hx of CKD and difficulty IV access. She had Rt IJ CVL placed 5/19 >> developed Rt neck hematoma with airway compromise and transferred to ICU. She has been transferred to SDU as her respiratory status has improved.   SUBJECTIVE:  HD 6/7 x1 L off, repeat treatment again, no events overnight.    VITAL SIGNS: BP 169/49 mmHg  Pulse 87  Temp(Src) 100 F (37.8 C) (Core (Comment))  Resp 30  Ht 5\' 3"  (1.6 m)  Wt 234 lb 2.1 oz (106.2 kg)  BMI 41.48 kg/m2  SpO2 93%  Vent Mode:  [-] PRVC FiO2 (%):  [50 %] 50 % Set Rate:  [34 bmp] 34 bmp Vt Set:  [420 mL] 420 mL PEEP:  [8 cmH20] 8 cmH20 Plateau Pressure:  [22 cmH20-25 cmH20] 23 cmH20 INTAKE / OUTPUT:  Intake/Output Summary (Last 24 hours) at 05/12/16 0910 Last data filed at 05/12/16 0800  Gross per 24 hour  Intake 603.67 ml  Output   2079 ml  Net -1475.33 ml     PHYSICAL EXAMINATION: General: Sedated, flickered eye only. Neuro: Not withdrawing to pain.  No gag and no dolls eye.  No respiratory drive. HEENT: Ecchymosis around neck and upper chest, ETT ok. Cardiac: RRR, Nl S1/S2, -M/R/G. Chest: Coarse BS diffusely. Abd: Soft, NT, ND and +BS. Ext: 2+ edema Skin: Multiple areas of ecchymosis and blistering.  LABS:  BMET  Recent Labs Lab 05/10/16 0406 05/11/16 0432 05/12/16 0619  NA 132* 134* 133*  K 5.6* 5.9* 5.5*  CL 98* 99* 96*  CO2 19* 21* 22  BUN 172* 147* 155*  CREATININE 4.08* 3.63* 3.58*  GLUCOSE 173* 147* 241*   Electrolytes  Recent Labs Lab 05/10/16 0406 05/11/16 0432 05/12/16 0619  CALCIUM 7.9* 8.1* 7.7*  MG 2.7* 2.7* 2.4  PHOS 7.8* 7.5* 8.0*   CBC  Recent  Labs Lab 05/10/16 0406 05/11/16 0432 05/12/16 0515  WBC 15.8* 16.4* 12.3*  HGB 9.7* 10.6* 8.7*  HCT 30.8* 31.8* 26.9*  PLT 72* 83* 87*   Coag's  Recent Labs Lab 05/10/16 0406  INR 1.33    Sepsis Markers No results for input(s): LATICACIDVEN, PROCALCITON, O2SATVEN in the last 168 hours. ABG  Recent Labs Lab 05/10/16 0407 05/11/16 0350 05/12/16 0350  PHART 7.205* 7.344* 7.245*  PCO2ART 48.9* 41.8 50.2*  PO2ART 145* 126* 115*   Liver Enzymes  Recent Labs Lab 05/07/16 0203 05/11/16 0432 05/12/16 0619  AST  --  65*  --   ALT  --  71*  --   ALKPHOS  --  184*  --   BILITOT  --  1.7*  --   ALBUMIN 2.9* 2.5* 2.1*   Cardiac Enzymes No results for input(s): TROPONINI, PROBNP in the last 168 hours. Glucose  Recent Labs Lab 05/11/16 1214 05/11/16 1558 05/11/16 2016 05/11/16 2354 05/12/16 0420 05/12/16 0800  GLUCAP 145* 183* 163* 202* 277* 203*   Imaging Dg Chest Port 1 View  05/12/2016  CLINICAL DATA:  Hypoxia EXAM: PORTABLE CHEST 1 VIEW COMPARISON:  May 11, 2016 FINDINGS: Endotracheal tube tip is 3.7 cm above the carina. Central catheter tips are in the superior  vena cava. Nasogastric tube tip and side port are below the diaphragm. No pneumothorax. There is patchy airspace consolidation in both upper lobes with mild interstitial edema throughout both lungs. There is atelectatic change in the left base. Heart is mildly enlarged with pulmonary vascularity within normal limits. No adenopathy. IMPRESSION: Tube and catheter positions as described without pneumothorax. Interstitial edema with patchy airspace opacity in the upper lobes, consistent with either alveolar edema or pneumonia. Both entities may exist concurrently. Probable atelectasis left base. Stable cardiac silhouette. Electronically Signed   By: Bretta Bang III M.D.   On: 05/12/2016 07:05  CX: right looks better; left still w/ sig airspace disease  STUDIES:  5/19 Echo >> mod LVH, EF 65 to 70%, grade 1  diastolic CHF 5/24 X-ray Neck >> Regressed density & stranding in neck. 5/24 CT neck w/o >> Airway in tact. No hematoma. Indentation of posterior pharynx by anatomy. Mild stranding right anterior chest & bilateral chin could be due to cellulitis or bruising. LE [place] 6/9>>>  MICROBIOLOGY: MRSA PCR 5/19:  Negative  BCX 3 6/9>>>  ANTIBIOTICS: Ceftaz 5/30 >>  Vancomycin start date unknown>>>6/9  SIGNIFICANT EVENTS: 5/19 Admit, cardiology consulted 5/21 Transfer to ICU with respiratory distress from Rt neck hematoma 5/22 Transferred back to SDU, tolerating  5/24- worseing status resp failure, back to ICU 5/26- Improving status transferred to SDU 5/30 transferred back to ICU 2/2 "resp distress/anxiety". Better on Bipap. 6/4 intubated for hypoxemic respiratory failure and paralyzed for asynchrony. 6/8 NMB stopped. Getting HD.  6/9 off all sedation. Unresponsive. BUN still 155. Low grade fevers so BC send and LE [place] checked. Prognosis looking grim  LINES/TUBES: 5/19 Rt IJ CVL >> ETT 6/4>>> L IJ tunneled HD catheter 6/7>>>  ASSESSMENT / PLAN:  PULMONARY A: Acute Hypoxemic Hypercapnic Resp Fx 2/2 Exacerbation of COPD, HCAP bibasilar (L > R), VCD, likely with OSA (undiagnosed) ->CXR w/ some improvement on right; but left still w/ element of airspace disease.  -> MS will prevent weaning P:   - Cont full vent support  - Titrate FiO2 to keep SpO2 > 92%, down to 50% and 8. - Breo d/c'ed 5/27 due to hoarseness/upper airway cough. - Duoneb PRN. - Pulmicort BID, brovana bid. - begin steroid taper  - Continue abx.  CARDIOVASCULAR A:  NSTEMI - Cath currently on hold. Hypertensive Urgency - BP better A fib Acute on Chronic Diastolic CHF H/O HTN P:  - Holding ASA, heparin gtt in setting of hematoma >> would like to see hgb stable for 48 hrs before starting ASA. Would consider [place] heparin first and then IV heparin only if hgb stable for another 48hrs after starting  - Renal services  following. - Continue amlodipine, lipitor, hydralazine, catapres  - Defer timing of cardiac cath to cardiology. - Continue lopressor 12.5 mg PO BID.  RENAL A:   Acute on Chronic Renal Failure Stage IV - Creatinine better; UOP minimal Hyperkalemia  P:   - Monitoring electrolytes & renal function daily. - Replace electrolytes as indicated. - HD per renal Attempt to remove as much volume as possible given the fluid overload and difficulty with oxygenation. - KVO IVF.   GASTROINTESTINAL A:   H/O GERD Questionable Aspiration P:   - TF per nutrition. - Protonix bid ac.   HEMATOLOGIC A:   Neck Hematoma - Resolving. Anemia - Mild. No obvious ongoing bleeding but hgb dropped almost 2 gms from 6/8 to 6/9. She is uremic. Has multiple areas of ecchymosis Thrombocytopenia-->improving as  of 6/9; HIT negative; but suspect she has significant platelet dysfxn P:  - Trending cell counts daily w/ CBC. - SCDs; hold [place] heparin until Hgb stable     INFECTIOUS A:   CXR with Bibasilar infiltrates, L > R. Likely HCAP Low grade fever  P:   - Day 11/14 fortaz. - dc vanc (no MRSA) - repeat BC - LE [place] r/o DVT .  ENDOCRINE A:   Steroid induced hyperglycemia - BG controlled.  P:   - Accu-Checks q4hr. - SSI per resistant algorithm.  NEUROLOGIC A:   Anxiety D/O Acute encephalopathy. Suspect uremia playing a big role here  H/O Depression  P: PAD protocol w/ RAS goal of -1 to -2 Cont lexapro Begin steroid taper   Summary obese wf quit smoking one year pta admitted with NSTEMI, HTN urgency, and AECOPD. She has hx of CKD and difficulty IV access. Had resp distress from right neck hematoma and airway compromise. Back in the ICU on 6/4 for progressive respiratory failure which was probably a combination of HCAP and pulmonary edema. Her renal fxn has worsened since admission and she is now on HD. As of 6/9 her current issues are: her Mental status (not waking up), suspect d/t uremia, low  grade fever, and on-going volume overload. For today the plan is HD (per renal), cut back on steroids, send BCs and ck LE [place] to r/o clot. Her prognosis for what she would consider an acceptable recovery is very poor.    Daughter updated by myself at bedside. Confirmed she would not want trach or SNF setting. Our goal is to get to a point that she can tolerate 1 way extubation; however I fear her deconditioning after this illness and to toll it will take on her may in the end put her in a place that would not be acceptable to the pt. I have made clear that prognosis is grim at best.  45 minutes   Simonne Martinet ACNP-BC [facility] Pulmonary/Critical Care Pager # [phone] OR # [phone] if no answer  Attending Note:  75 year old female with extensive PMH presenting with an MI and subsequent CHF, renal failure and heart failure.  Now BUN is extremely elevated.  Undergoing dialysis today and tomorrow, already had two other treatments.  Unfortunately making little headway.  Spoke with daughter, confirmed no trach/peg.  Will continue support through the weekend and likely withdraw on Monday if no progress is made.    The patient is critically ill with multiple organ systems failure and requires high complexity decision making for assessment and support, frequent evaluation and titration of therapies, application of advanced monitoring technologies and extensive interpretation of multiple databases.   Critical Care Time devoted to patient care services described in this note is  35  Minutes. This time reflects time of care of this signee Dr Koren Bound. This critical care time does not reflect procedure time, or teaching time or supervisory time of PA/NP/Med student/Med Resident etc but could involve care discussion time.  Alyson Reedy, M.D. [facility] Pulmonary/Critical Care Medicine. Pager: [phone]. After hours pager: [phone].

## 2016-05-12 NOTE — Progress Notes (Signed)
Inpatient Diabetes Program Recommendations  AACE/ADA: New Consensus Statement on Inpatient Glycemic Control (2015)  Target Ranges:  Prepandial:   less than 140 mg/dL      Peak postprandial:   less than 180 mg/dL (1-2 hours)      Critically ill patients:  140 - 180 mg/dL   Lab Results  Component Value Date   GLUCAP 203* 05/12/2016    Review of Glycemic ControlResults for AKEILA, YAROSH (MRN [id number]) as of 05/12/2016 11:11  Ref. Range 05/11/2016 20:16 05/11/2016 23:54 05/12/2016 04:20 05/12/2016 08:00  Glucose-Capillary Latest Ref Range: 65-99 mg/dL 341 (H) 962 (H) 229 (H) 203 (H)   Inpatient Diabetes Program Recommendations:    Note that blood sugars trending up.  May consider ICU glycemic control protocol-phase 2 IV insulin?  Thanks, Beryl Meager, RN, BC-ADM Inpatient Diabetes Coordinator Pager [phone] (8a-5p)

## 2016-05-12 NOTE — Procedures (Signed)
Pt seen on HD.  Ap 100 Vp 90  BFR 300.  SBP 106.  Will try for 3L but BP mat preclude that.  According to nurse, she did not get BP med today.

## 2016-05-12 NOTE — Progress Notes (Signed)
S: sedated.  BP a little low with HD yest but able to get 2L O:BP 169/49 mmHg  Pulse 87  Temp(Src) 100 F (37.8 C) (Core (Comment))  Resp 30  Ht 5' 3"  (1.6 m)  Wt 106.2 kg (234 lb 2.1 oz)  BMI 41.48 kg/m2  SpO2 93%  Intake/Output Summary (Last 24 hours) at 05/12/16 0919 Last data filed at 05/12/16 0800  Gross per 24 hour  Intake 603.67 ml  Output   2079 ml  Net -1475.33 ml   Weight change: -0.3 kg (-10.6 oz) Gen: Sedated CVS: RRR Resp: Scattered wheezes Abd:+ BS ND, soft Ext: 1+ edema NEURO: Sedated Rt IJ triple lumen Lt IJ HD cath   . amLODipine  10 mg Per Tube Daily  . antiseptic oral rinse  7 mL Mouth Rinse 10 times per day  . arformoterol  15 mcg Nebulization BID  . atorvastatin  40 mg Per Tube q1800  . budesonide (PULMICORT) nebulizer solution  0.25 mg Nebulization Q6H  . cefTAZidime (FORTAZ)  IV  1 g Intravenous Q24H  . chlorhexidine gluconate (SAGE KIT)  15 mL Mouth Rinse BID  . cloNIDine  0.1 mg Per Tube TID  . escitalopram  20 mg Per Tube Daily  . feeding supplement (PRO-STAT SUGAR FREE 64)  60 mL Per Tube TID  . feeding supplement (VITAL 1.5 CAL)  1,000 mL Per Tube Q24H  . hydrALAZINE  50 mg Per Tube Q8H  . insulin aspart  0-20 Units Subcutaneous Q4H  . ipratropium-albuterol  3 mL Nebulization Q6H  . isosorbide mononitrate  120 mg Oral Daily  . methylPREDNISolone (SOLU-MEDROL) injection  40 mg Intravenous Q6H  . metoprolol tartrate  12.5 mg Per Tube BID  . pantoprazole sodium  40 mg Per Tube Q24H  . sodium chloride flush  10-40 mL Intracatheter Q12H   Ct Head Wo Contrast  05/10/2016  CLINICAL DATA:  Altered mental status EXAM: CT HEAD WITHOUT CONTRAST TECHNIQUE: Contiguous axial images were obtained from the base of the skull through the vertex without intravenous contrast. COMPARISON:  January 19, 2013 FINDINGS: There is age related volume loss, stable. There is no intracranial mass, hemorrhage, extra-axial fluid collection, or midline shift. There is a  focal area of decreased attenuation in the lateral left lentiform nucleus, felt to represent a prior small infarct. Elsewhere gray-white compartments appear normal. No acute infarct evident. Bony calvarium appears intact. Mastoid air cells are clear. No intraorbital lesions are evident. There is mucosal thickening in the right frontal sinus. There is also an air-fluid level in the left sphenoid sinus region. 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 No acute infarct evident. No hemorrhage or mass effect. Areas of paranasal sinus disease. Electronically Signed   By: Lowella Grip III M.D.   On: 05/10/2016 17:24   Ir Fluoro Guide Cv Line Left  05/10/2016  INDICATION: Acute renal failure, no current access for dialysis. EXAM: ULTRASOUND GUIDANCE FOR VASCULAR ACCESS LEFT INTERNAL JUGULAR PERMANENT HEMODIALYSIS CATHETER Date:  6/7/20176/06/2016 4:55 pm Radiologist:  M. Daryll Brod, MD Guidance:  ULTRASOUND AND FLUOROSCOPIC FLUOROSCOPY TIME:  Fluoroscopy Time: 1Minutes 6 seconds (7 mGy). MEDICATIONS: PATIENT IS RECEIVING VANCOMYCIN AND CEFTAZ IS AS AN INPATIENT. ANESTHESIA/SEDATION: She is currently on fentanyl drip for sedation while on the ventilator. Moderate Sedation Time:  None. The patient was continuously monitored during the procedure by the interventional radiology nurse under my direct supervision. CONTRAST:  None. COMPLICATIONS: None immediate. PROCEDURE: Informed consent was obtained from the patient following  explanation of the procedure, risks, benefits and alternatives. The patient understands, agrees and consents for the procedure. All questions were addressed. A time out was performed. Maximal barrier sterile technique utilized including caps, mask, sterile gowns, sterile gloves, large sterile drape, hand hygiene, and 2% chlorhexidine scrub. Under sterile conditions and local anesthesia, left internal jugular micropuncture venous access was performed with ultrasound. Images  were obtained for documentation. Imaging confirms patency of the left internal jugular vein. A guide wire was inserted followed by a transitional dilator. Next, a 0.035 guidewire was advanced into the IVC with a 5-French catheter. Measurements were obtained from the left venotomy site to the proximal right atrium. In the left infraclavicular chest, a subcutaneous tunnel was created under sterile conditions and local anesthesia. 1% lidocaine with epinephrine was utilized for this. The 28 cm tip to cuff palindrome catheter was tunneled subcutaneously to the venotomy site and inserted into the SVC/RA junction through a valved peel-away sheath. Position was confirmed with fluoroscopy. Images were obtained for documentation. Blood was aspirated from the catheter followed by saline and heparin flushes. The appropriate volume and strength of heparin was instilled in each lumen. Caps were applied. The catheter was secured at the tunnel site with Gelfoam and a pursestring suture. The venotomy site was closed with subcuticular Vicryl suture. Dermabond was applied to the small right neck incision. A dry sterile dressing was applied. The catheter is ready for use. No immediate complications. IMPRESSION: Ultrasound and fluoroscopically guided left internal jugular tunneled hemodialysis catheter (28 cm tip to cuff palindrome catheter). Electronically Signed   By: Jerilynn Mages.  Shick M.D.   On: 05/10/2016 17:14   Ir US Guide Vasc Access Left  05/10/2016  INDICATION: Acute renal failure, no current access for dialysis. EXAM: ULTRASOUND GUIDANCE FOR VASCULAR ACCESS LEFT INTERNAL JUGULAR PERMANENT HEMODIALYSIS CATHETER Date:  6/7/20176/06/2016 4:55 pm Radiologist:  M. Daryll Brod, MD Guidance:  ULTRASOUND AND FLUOROSCOPIC FLUOROSCOPY TIME:  Fluoroscopy Time: 1Minutes 6 seconds (7 mGy). MEDICATIONS: PATIENT IS RECEIVING VANCOMYCIN AND CEFTAZ IS AS AN INPATIENT. ANESTHESIA/SEDATION: She is currently on fentanyl drip for sedation while on the  ventilator. Moderate Sedation Time:  None. The patient was continuously monitored during the procedure by the interventional radiology nurse under my direct supervision. CONTRAST:  None. COMPLICATIONS: None immediate. PROCEDURE: Informed consent was obtained from the patient following explanation of the procedure, risks, benefits and alternatives. The patient understands, agrees and consents for the procedure. All questions were addressed. A time out was performed. Maximal barrier sterile technique utilized including caps, mask, sterile gowns, sterile gloves, large sterile drape, hand hygiene, and 2% chlorhexidine scrub. Under sterile conditions and local anesthesia, left internal jugular micropuncture venous access was performed with ultrasound. Images were obtained for documentation. Imaging confirms patency of the left internal jugular vein. A guide wire was inserted followed by a transitional dilator. Next, a 0.035 guidewire was advanced into the IVC with a 5-French catheter. Measurements were obtained from the left venotomy site to the proximal right atrium. In the left infraclavicular chest, a subcutaneous tunnel was created under sterile conditions and local anesthesia. 1% lidocaine with epinephrine was utilized for this. The 28 cm tip to cuff palindrome catheter was tunneled subcutaneously to the venotomy site and inserted into the SVC/RA junction through a valved peel-away sheath. Position was confirmed with fluoroscopy. Images were obtained for documentation. Blood was aspirated from the catheter followed by saline and heparin flushes. The appropriate volume and strength of heparin was instilled in each lumen. Caps were applied.  The catheter was secured at the tunnel site with Gelfoam and a pursestring suture. The venotomy site was closed with subcuticular Vicryl suture. Dermabond was applied to the small right neck incision. A dry sterile dressing was applied. The catheter is ready for use. No immediate  complications. IMPRESSION: Ultrasound and fluoroscopically guided left internal jugular tunneled hemodialysis catheter (28 cm tip to cuff palindrome catheter). Electronically Signed   By: Jerilynn Mages.  Shick M.D.   On: 05/10/2016 17:14   Dg Chest Port 1 View  05/12/2016  CLINICAL DATA:  Hypoxia EXAM: PORTABLE CHEST 1 VIEW COMPARISON:  May 11, 2016 FINDINGS: Endotracheal tube tip is 3.7 cm above the carina. Central catheter tips are in the superior vena cava. Nasogastric tube tip and side port are below the diaphragm. No pneumothorax. There is patchy airspace consolidation in both upper lobes with mild interstitial edema throughout both lungs. There is atelectatic change in the left base. Heart is mildly enlarged with pulmonary vascularity within normal limits. No adenopathy. IMPRESSION: Tube and catheter positions as described without pneumothorax. Interstitial edema with patchy airspace opacity in the upper lobes, consistent with either alveolar edema or pneumonia. Both entities may exist concurrently. Probable atelectasis left base. Stable cardiac silhouette. Electronically Signed   By: Lowella Grip III M.D.   On: 05/12/2016 07:05   Dg Chest Port 1 View  05/11/2016  CLINICAL DATA:  Congestive heart failure EXAM: PORTABLE CHEST 1 VIEW COMPARISON:  05/10/2016 FINDINGS: Endotracheal tube tip about 3.7 cm above the carina. Orogastric tube crosses the gastroesophageal junction. Stable right IJ central line. Persistent cardiac enlargement with hazy perihilar attenuation as well as left lower lobe opacity. Overall appearance very similar to prior study with mildly improved aeration at both lung bases. IMPRESSION: Pulmonary edema with asymmetric opacity left lung base suggesting consolidation related to asymmetric edema atelectasis or pneumonitis. Mild improvement in bi basilar aeration. Electronically Signed   By: Skipper Cliche M.D.   On: 05/11/2016 08:06   BMET    Component Value Date/Time   NA 133* 05/12/2016  0619   K 5.5* 05/12/2016 0619   CL 96* 05/12/2016 0619   CO2 22 05/12/2016 0619   GLUCOSE 241* 05/12/2016 0619   BUN 155* 05/12/2016 0619   CREATININE 3.58* 05/12/2016 0619   CREATININE 1.98* 01/05/2016 0923   CALCIUM 7.7* 05/12/2016 0619   GFRNONAA 12* 05/12/2016 0619   GFRAA 14* 05/12/2016 0619   CBC    Component Value Date/Time   WBC 12.3* 05/12/2016 0515   RBC 2.73* 05/12/2016 0515   HGB 8.7* 05/12/2016 0515   HCT 26.9* 05/12/2016 0515   PLT 87* 05/12/2016 0515   MCV 98.5 05/12/2016 0515   MCH 31.9 05/12/2016 0515   MCHC 32.3 05/12/2016 0515   RDW 16.6* 05/12/2016 0515   LYMPHSABS 0.4* 05/11/2016 0432   MONOABS 0.4 05/11/2016 0432   EOSABS 0.0 05/11/2016 0432   BASOSABS 0.0 05/11/2016 0432     Assessment: 1.  Acute on CKD 3 most likely hemodynamically mediated due to low BP post complication from line placement. 2. CKD 3 sec HTN 3. COPD 4. ? PNA 5. NSTEMI 6. Mild hyperkalemia 7. thrombocytopenia Plan: 1.  Will plan HD again today and probably again tomorrow 2. Discussed with CC PA about checking BC.  Not being covered for Gm + and has 3 lines in 3. Wean steroids  Mayerly Kaman T

## 2016-05-12 NOTE — Progress Notes (Signed)
Only 1 of 3 ports//R IJ cvc line working.  Other two ports occluded and will not flush or withdraw blood.  Staff RN aware  and will notify MD //pt now has only 1 access.Marland Kitchen

## 2016-05-12 NOTE — Progress Notes (Signed)
Pharmacy Antibiotic Note  [name] [name] is a 75 y.o. female admitted on 04/06/2016 for NSTEMI also being treated for HCAP. Pharmacy has been consulted for ceftazidime dosing- plan for 14 days of therapy.  She was also on vancomycin- there is currently no active order or consult, but her level was 70mcg/mL on 6/7 prior to starting HD. After receiving 2 sessions of HD- each for ~2-2.5g with BFR of 250mL/min, here estimated level is ~52mcg/mL- still therapeutic.  Plans for HD again today and likely tomorrow.  Plan: -Ceftazidime 1g IV q24h- can change to 2g IV qHD once HD schedule is known -Stop date for 6/12 entered- this will be 14 full days of therapy. -follow clinical progression, c/s, HD schedule/tolerance   Height: 5\' 3"  (160 cm) Weight: 234 lb 2.1 oz (106.2 kg) IBW/kg (Calculated) : 52.4  Temp (24hrs), Avg:99.1 F (37.3 C), Min:97.2 F (36.2 C), Max:100.8 F (38.2 C)   Recent Labs Lab 05/08/16 0405  05/09/16 0300 05/09/16 0855 05/10/16 0406 05/10/16 1207 05/11/16 0432 05/12/16 0515 05/12/16 0619  WBC 11.8*  < > 8.0 9.6 15.8*  --  16.4* 12.3*  --   CREATININE 2.58*  --  2.90*  --  4.08*  --  3.63*  --  3.58*  VANCORANDOM  --   --   --   --   --  36  --   --   --   < > = values in this interval not displayed.  Estimated Creatinine Clearance: 16.3 mL/min (by C-G formula based on Cr of 3.58).    Allergies  Allergen Reactions  . Ciprofloxacin     rash    Antimicrobials this admission: Ceftazidime 5/30 >>  Vancomycin 6/4>>6/9  Dose adjustments this admission: 6/4: Ceftaz Increased to q12h for CrCl ~30 ml/min 6/5: Ceftaz decreased to q24h for CrCl ~22 6/6: vancomycin decreased to 1500mg  IV q48h 6/7: ceftazidime decreased to 1g IV q24h  Microbiology results: 5/19 MRSA PCR: neg 5/30 urine: mult species 6/4 sputum: nml flora 6/6 TA: mod yeast 6/9 Bcx: sent  [name] [name], PharmD, BCPS Clinical Pharmacist Pager: [phone] 05/12/2016 10:25 AM

## 2016-05-13 ENCOUNTER — Inpatient Hospital Stay (HOSPITAL_COMMUNITY): Payer: Medicare Other

## 2016-05-13 DIAGNOSIS — L8990 Pressure ulcer of unspecified site, unspecified stage: Secondary | ICD-10-CM | POA: Insufficient documentation

## 2016-05-13 DIAGNOSIS — G9340 Encephalopathy, unspecified: Secondary | ICD-10-CM

## 2016-05-13 DIAGNOSIS — R509 Fever, unspecified: Secondary | ICD-10-CM

## 2016-05-13 LAB — POCT I-STAT 3, ART BLOOD GAS (G3+)
ACID-BASE DEFICIT: 1 mmol/L (ref 0.0–2.0)
Bicarbonate: 24.1 mEq/L — ABNORMAL HIGH (ref 20.0–24.0)
O2 SAT: 98 %
PCO2 ART: 42.7 mmHg (ref 35.0–45.0)
PO2 ART: 115 mmHg — AB (ref 80.0–100.0)
Patient temperature: 99
TCO2: 25 mmol/L (ref 0–100)
pH, Arterial: 7.361 (ref 7.350–7.450)

## 2016-05-13 LAB — HEPATITIS B SURFACE ANTIBODY,QUALITATIVE: HEP B S AB: NONREACTIVE

## 2016-05-13 LAB — CBC
HCT: 24.5 % — ABNORMAL LOW (ref 36.0–46.0)
Hemoglobin: 7.9 g/dL — ABNORMAL LOW (ref 12.0–15.0)
MCH: 32 pg (ref 26.0–34.0)
MCHC: 32.2 g/dL (ref 30.0–36.0)
MCV: 99.2 fL (ref 78.0–100.0)
PLATELETS: 41 10*3/uL — AB (ref 150–400)
RBC: 2.47 MIL/uL — AB (ref 3.87–5.11)
RDW: 15.8 % — AB (ref 11.5–15.5)
WBC: 11.9 10*3/uL — AB (ref 4.0–10.5)

## 2016-05-13 LAB — VANCOMYCIN, RANDOM: Vancomycin Rm: 18 ug/mL

## 2016-05-13 LAB — BLOOD CULTURE ID PANEL (REFLEXED)
ACINETOBACTER BAUMANNII: NOT DETECTED
CANDIDA ALBICANS: NOT DETECTED
CANDIDA TROPICALIS: NOT DETECTED
Candida glabrata: NOT DETECTED
Candida krusei: NOT DETECTED
Candida parapsilosis: NOT DETECTED
Carbapenem resistance: NOT DETECTED
ENTEROBACTER CLOACAE COMPLEX: NOT DETECTED
ENTEROBACTERIACEAE SPECIES: NOT DETECTED
ENTEROCOCCUS SPECIES: NOT DETECTED
ESCHERICHIA COLI: NOT DETECTED
HAEMOPHILUS INFLUENZAE: NOT DETECTED
Klebsiella oxytoca: NOT DETECTED
Klebsiella pneumoniae: NOT DETECTED
Listeria monocytogenes: NOT DETECTED
METHICILLIN RESISTANCE: DETECTED — AB
NEISSERIA MENINGITIDIS: NOT DETECTED
PSEUDOMONAS AERUGINOSA: NOT DETECTED
Proteus species: NOT DETECTED
STAPHYLOCOCCUS AUREUS BCID: NOT DETECTED
STREPTOCOCCUS AGALACTIAE: NOT DETECTED
STREPTOCOCCUS PNEUMONIAE: NOT DETECTED
STREPTOCOCCUS PYOGENES: NOT DETECTED
STREPTOCOCCUS SPECIES: NOT DETECTED
Serratia marcescens: NOT DETECTED
Staphylococcus species: DETECTED — AB
VANCOMYCIN RESISTANCE: NOT DETECTED

## 2016-05-13 LAB — RENAL FUNCTION PANEL
Albumin: 2 g/dL — ABNORMAL LOW (ref 3.5–5.0)
Anion gap: 15 (ref 5–15)
BUN: 123 mg/dL — AB (ref 6–20)
CALCIUM: 7.8 mg/dL — AB (ref 8.9–10.3)
CHLORIDE: 97 mmol/L — AB (ref 101–111)
CO2: 24 mmol/L (ref 22–32)
CREATININE: 2.8 mg/dL — AB (ref 0.44–1.00)
GFR, EST AFRICAN AMERICAN: 18 mL/min — AB (ref >60)
GFR, EST NON AFRICAN AMERICAN: 16 mL/min — AB (ref >60)
Glucose, Bld: 201 mg/dL — ABNORMAL HIGH (ref 65–99)
Phosphorus: 6 mg/dL — ABNORMAL HIGH (ref 2.5–4.6)
Potassium: 4.5 mmol/L (ref 3.5–5.1)
SODIUM: 136 mmol/L (ref 135–145)

## 2016-05-13 LAB — HEPATITIS B SURFACE ANTIGEN: Hepatitis B Surface Ag: NEGATIVE

## 2016-05-13 LAB — HEPATITIS B CORE ANTIBODY, TOTAL: HEP B C TOTAL AB: NEGATIVE

## 2016-05-13 LAB — GLUCOSE, CAPILLARY
Glucose-Capillary: 186 mg/dL — ABNORMAL HIGH (ref 65–99)
Glucose-Capillary: 192 mg/dL — ABNORMAL HIGH (ref 65–99)
Glucose-Capillary: 194 mg/dL — ABNORMAL HIGH (ref 65–99)
Glucose-Capillary: 196 mg/dL — ABNORMAL HIGH (ref 65–99)

## 2016-05-13 LAB — MAGNESIUM: MAGNESIUM: 2.3 mg/dL (ref 1.7–2.4)

## 2016-05-13 MED ORDER — NOREPINEPHRINE BITARTRATE 1 MG/ML IV SOLN
2.0000 ug/min | INTRAVENOUS | Status: DC
  Filled 2016-05-13: qty 4

## 2016-05-13 MED ORDER — VANCOMYCIN HCL 10 G IV SOLR
2000.0000 mg | Freq: Once | INTRAVENOUS | Status: DC
  Filled 2016-05-13: qty 2000

## 2016-05-13 MED ORDER — VANCOMYCIN HCL IN DEXTROSE 1-5 GM/200ML-% IV SOLN
1000.0000 mg | Freq: Once | INTRAVENOUS | Status: DC
  Filled 2016-05-13: qty 200

## 2016-05-13 MED ORDER — METOPROLOL TARTRATE 25 MG/10 ML ORAL SUSPENSION: 25.0000 mg | Freq: Two times a day (BID) | ORAL | Status: DC

## 2016-05-15 LAB — CULTURE, BLOOD (ROUTINE X 2)

## 2016-05-17 ENCOUNTER — Telehealth: Payer: Self-pay

## 2016-05-17 LAB — CULTURE, BLOOD (ROUTINE X 2): CULTURE: NO GROWTH

## 2016-05-17 NOTE — Telephone Encounter (Signed)
On 05/17/2016 I received a death certificate from [facility] (original). The death certificate is for burial. The patient is a patient of Doctor Molli Knock. The death certificate will be taken to [facility] ([id number]) for signature. On May 23, 2016 I received the death certificate back from Doctor Molli Knock. I got the death certificate ready and called the funeral home to let them know I mailed the death certificate to the [facility] per their request.

## 2016-06-03 NOTE — Progress Notes (Signed)
eLink Physician-Brief Progress Note Patient Name: Terese Vi DOB: [birth year] MRN: [id number]   Date of Service  05/29/2016  HPI/Events of Note  Hypotension - BP = 49/28. Etiology of hypotension?   eICU Interventions  Will order: 1. Norepinephrine IV infusion.     Intervention Category Major Interventions: Hypotension - evaluation and management  Sommer,Steven Eugene 05/14/2016, 4:18 PM

## 2016-06-03 NOTE — Progress Notes (Signed)
Patient's BP rapidly declined during HD.  E-Link MD notified and Levophed orders given.  Patient a LCB.  Patient asystole at 1641.  E-Link MD notified.  No heart tones or breath sounds auscultated by 3 RN's-Lauren Herbert Moors, Gaspar Garbe & [place].  Family at bedside, emotional support offered.  [place], Mitzi Hansen

## 2016-06-03 NOTE — Progress Notes (Signed)
*  Preliminary Results* Bilateral lower extremity venous duplex completed. Study was technically limited due to patient position and patient's inability to cooperate. Bilateral lower extremities are negative for deep vein thrombosis. There is no evidence of Baker's cyst bilaterally.  05/23/2016  Gertie Fey, RVT, RDCS, RDMS

## 2016-06-03 NOTE — Progress Notes (Signed)
PULMONARY / CRITICAL CARE MEDICINE   Name: [name] [name] MRN: [id number] DOB: [birth year]    ADMISSION DATE:  04/20/2016 CONSULTATION DATE:  04/23/2016  REFERRING MD:  Viann Fish, Teodora Medici  CHIEF COMPLAINT:  Can't breath  Brief Profile  75 yo obese wf quit smoking one year pta  admitted with NSTEMI, HTN urgency, and AECOPD. She has hx of CKD and difficulty IV access. She had Rt IJ CVL placed 5/19 >> developed Rt neck hematoma with airway compromise and transferred to ICU. She has been transferred to SDU as her respiratory status has improved.   SUBJECTIVE:  No events overnight.  Planned HD again today.  VITAL SIGNS: BP 158/39 mmHg  Pulse 83  Temp(Src) 99.7 F (37.6 C) (Core (Comment))  Resp 34  Ht  (1.6 m)  Wt 104.3 kg (229 lb 15 oz)  BMI 40.74 kg/m2  SpO2 98%  Vent Mode:  [-] PRVC FiO2 (%):  [40 %-50 %] 40 % Set Rate:  [34 bmp] 34 bmp Vt Set:  [420 mL] 420 mL PEEP:  [8 cmH20] 8 cmH20 Plateau Pressure:  [17 cmH20-28 cmH20] 23 cmH20 INTAKE / OUTPUT:  Intake/Output Summary (Last 24 hours) at 05-23-16 0823 Last data filed at 23-May-2016 0700  Gross per 24 hour  Intake    960 ml  Output   1470 ml  Net   -510 ml     PHYSICAL EXAMINATION: General: Sedated, flickered eye only. Neuro: Not withdrawing to pain.  No gag and no dolls eye.  No respiratory drive. HEENT: Ecchymosis around neck and upper chest, ETT ok. Cardiac: RRR, Nl S1/S2, -M/R/G. Chest: Coarse BS diffusely. Abd: Soft, NT, ND and +BS. Ext: 2+ edema Skin: Multiple areas of ecchymosis and blistering.  LABS:  BMET  Recent Labs Lab 05/11/16 0432 05/12/16 0619 May 23, 2016 0400  NA 134* 133* 136  K 5.9* 5.5* 4.5  CL 99* 96* 97*  CO2 21* 22 24  BUN 147* 155* 123*  CREATININE 3.63* 3.58* 2.80*  GLUCOSE 147* 241* 201*   Electrolytes  Recent Labs Lab 05/11/16 0432 05/12/16 0619 May 23, 2016 0400  CALCIUM 8.1* 7.7* 7.8*  MG 2.7* 2.4 2.3  PHOS 7.5* 8.0* 6.0*   CBC  Recent Labs Lab  05/11/16 0432 05/12/16 0515 23-May-2016 0400  WBC 16.4* 12.3* 11.9*  HGB 10.6* 8.7* 7.9*  HCT 31.8* 26.9* 24.5*  PLT 83* 87* 41*   Coag's  Recent Labs Lab 05/10/16 0406  INR 1.33    Sepsis Markers No results for input(s): LATICACIDVEN, PROCALCITON, O2SATVEN in the last 168 hours. ABG  Recent Labs Lab 05/11/16 0350 05/12/16 0350 05/23/16 0409  PHART 7.344* 7.245* 7.361  PCO2ART 41.8 50.2* 42.7  PO2ART 126* 115* 115.0*   Liver Enzymes  Recent Labs Lab 05/11/16 0432 05/12/16 0619 05/23/16 0400  AST 65*  --   --   ALT 71*  --   --   ALKPHOS 184*  --   --   BILITOT 1.7*  --   --   ALBUMIN 2.5* 2.1* 2.0*   Cardiac Enzymes No results for input(s): TROPONINI, PROBNP in the last 168 hours. Glucose  Recent Labs Lab 05/12/16 0800 05/12/16 1142 05/12/16 1617 05/12/16 1943 05/12/16 2344 23-May-2016 0340  GLUCAP 203* 152* 151* 157* 194* 196*   Imaging Dg Chest Port 1 View  05-23-16  CLINICAL DATA:  ETT EXAM: PORTABLE CHEST 1 VIEW COMPARISON:  May 12, 2016 FINDINGS: Stable support apparatus including the ETT. Increased interstitial markings in the right greater than left  lung may be at least partially due to positioning. I suspect mild edema. There is a mildly more focal opacity in the left upper lung. IMPRESSION: 1. Stable support apparatus. 2. Probable mild edema. 3. Focal opacity in the left mid lung. Recommend attention on follow-up. Electronically Signed   By: Gerome Sam III M.D   On: 2016/05/21 08:17  CX: right looks better; left still w/ sig airspace disease  STUDIES:  5/19 Echo >> mod LVH, EF 65 to 70%, grade 1 diastolic CHF 5/24 X-ray Neck >> Regressed density & stranding in neck. 5/24 CT neck w/o >> Airway in tact. No hematoma. Indentation of posterior pharynx by anatomy. Mild stranding right anterior chest & bilateral chin could be due to cellulitis or bruising. LE [place] 6/9>>>  MICROBIOLOGY: MRSA PCR 5/19:  Negative  BCX 3  6/9>>>  ANTIBIOTICS: Ceftaz 5/30 >>  Vancomycin start date unknown>>>6/9  SIGNIFICANT EVENTS: 5/19 Admit, cardiology consulted 5/21 Transfer to ICU with respiratory distress from Rt neck hematoma 5/22 Transferred back to SDU, tolerating  5/24- worseing status resp failure, back to ICU 5/26- Improving status transferred to SDU 5/30 transferred back to ICU 2/2 "resp distress/anxiety". Better on Bipap. 6/4 intubated for hypoxemic respiratory failure and paralyzed for asynchrony. 6/8 NMB stopped. Getting HD.  6/9 off all sedation. Unresponsive. BUN still 155. Low grade fevers so BC send and LE [place] checked. Prognosis looking grim  LINES/TUBES: 5/19 Rt IJ CVL >> ETT 6/4>>> L IJ tunneled HD catheter 6/7>>>  ASSESSMENT / PLAN:  PULMONARY A: Acute Hypoxemic Hypercapnic Resp Fx 2/2 Exacerbation of COPD, HCAP bibasilar (L > R), VCD, likely with OSA (undiagnosed) ->CXR w/ some improvement on right; but left still w/ element of airspace disease.  -> MS will prevent weaning P:   - Cont full vent support  - Decrease PEEP to 5 (done at 8:20 AM) - Titrate FiO2 to keep SpO2 > 92%, down to 40% and 5. - Breo d/c'ed 5/27 due to hoarseness/upper airway cough. - Duoneb PRN. - Pulmicort BID, brovana bid. - Steroid taper at 40 mg IV q12, will decrease to 40 mg IV daily on 6/12. - Continue abx.  CARDIOVASCULAR A:  NSTEMI - Cath currently on hold. Hypertensive Urgency - BP better A fib Acute on Chronic Diastolic CHF H/O HTN P:  - Holding ASA, heparin gtt in setting of hematoma >> would like to see hgb stable for 48 hrs before starting ASA. Would consider [place] heparin first and then IV heparin only if hgb stable for another 48hrs after starting  - Renal services following. - Continue amlodipine, lipitor, hydralazine, catapres  - Defer timing of cardiac cath to cardiology and pending neuro prognosis. - Increase lopressor 25 mg PO BID.  RENAL A:   Acute on Chronic Renal Failure Stage IV -  Creatinine better; UOP minimal Hyperkalemia  P:   - Monitoring electrolytes & renal function daily. - Replace electrolytes as indicated. - HD again today, hopefully remove as much fluid as safe (PEEP and FiO2 are improving with fluid removal). - KVO IVF.  GASTROINTESTINAL A:   H/O GERD Questionable Aspiration P:   - TF per nutrition. - Protonix bid.   HEMATOLOGIC A:   Neck Hematoma - Resolving. Anemia - Mild. No obvious ongoing bleeding but hgb dropped almost 2 gms from 6/8 to 6/9. She is uremic. Has multiple areas of ecchymosis Thrombocytopenia-->improving as of 6/9; HIT negative; but suspect she has significant platelet dysfxn P:  - Trending cell counts daily w/ CBC. -  SCDs; hold [place] heparin until Hgb stable   INFECTIOUS A:   CXR with Bibasilar infiltrates, L > R. Likely HCAP Low grade fever  P:   - Day 12/14 fortaz. - D/C vanc (no MRSA) - repeat BC - LE [place] r/o DVT .  ENDOCRINE A:   Steroid induced hyperglycemia - BG controlled.  P:   - Accu-Checks q4hr. - SSI per resistant algorithm.  NEUROLOGIC A:   Anxiety D/O Acute encephalopathy. Suspect uremia playing a big role here  H/O Depression  P: - PAD protocol w/ RAS goal of -1 to -2 - Cont lexapro - Steroid taper as above.  Summary Obese wf quit smoking one year pta admitted with NSTEMI, HTN urgency, and AECOPD. She has hx of CKD and difficulty IV access. Had resp distress from right neck hematoma and airway compromise. Back in the ICU on 6/4 for progressive respiratory failure which was probably a combination of HCAP and pulmonary edema. Her renal fxn has worsened since admission and she is now on HD. As of 6/9 her current issues are: her Mental status (not waking up), suspect d/t uremia, low grade fever, and on-going volume overload. For today the plan is HD (per renal), cut back on steroids, send BCs and ck LE [place] to r/o clot. Her prognosis for what she would consider an acceptable recovery is very poor.    Plan to dialyze again today and if no improvement by the end of the weekend then will discuss withdrawal on Monday.  The patient is critically ill with multiple organ systems failure and requires high complexity decision making for assessment and support, frequent evaluation and titration of therapies, application of advanced monitoring technologies and extensive interpretation of multiple databases.   Critical Care Time devoted to patient care services described in this note is  36  Minutes. This time reflects time of care of this signee Dr Koren Bound. This critical care time does not reflect procedure time, or teaching time or supervisory time of PA/NP/Med student/Med Resident etc but could involve care discussion time.  Alyson Reedy, M.D. [facility] Pulmonary/Critical Care Medicine. Pager: [phone]. After hours pager: [phone].

## 2016-06-03 NOTE — Progress Notes (Signed)
Hemodialysis treatment initiated at 1452 per orders with SBP of 112. Rapid decline in BP over 30 minutes to 66/30. UF turned off and NS bolus given for blood pressure support. Prompt return to SBP in 90s. UF off continued until SBP in low 100s. Resumed UF on with goal lowered. BP resumed rapid decline as before. UF turned back off and NS given. No BP rebound noted. Second bolus administered while awaiting call back from MD page. Dr. Briant Cedar returned pade and made aware of the patient condition and interventions. MD order received to discontinue treatment. Blood rinsed back per protocol. Primary nurse present and aware of condition and interventions.

## 2016-06-03 NOTE — Progress Notes (Signed)
S: sedated.  O:BP 158/39 mmHg  Pulse 106  Temp(Src) 99.7 F (37.6 C) (Core (Comment))  Resp 32  Ht 5' 3"  (1.6 m)  Wt 104.3 kg (229 lb 15 oz)  BMI 40.74 kg/m2  SpO2 98%  Intake/Output Summary (Last 24 hours) at 06-12-2016 0831 Last data filed at 06-12-2016 0800  Gross per 24 hour  Intake    990 ml  Output   1470 ml  Net   -480 ml   Weight change: 2.7 kg (5 lb 15.2 oz) Gen: Sedated CVS: irreg,irreg Resp: clear ant Abd:+ BS ND, soft Ext: 1+ edema NEURO: Sedated Rt IJ triple lumen Lt IJ HD cath   . amLODipine  10 mg Per Tube Daily  . antiseptic oral rinse  7 mL Mouth Rinse 10 times per day  . arformoterol  15 mcg Nebulization BID  . atorvastatin  40 mg Per Tube q1800  . budesonide (PULMICORT) nebulizer solution  0.25 mg Nebulization Q6H  . cefTAZidime (FORTAZ)  IV  1 g Intravenous Q24H  . chlorhexidine gluconate (SAGE KIT)  15 mL Mouth Rinse BID  . cloNIDine  0.1 mg Per Tube TID  . escitalopram  20 mg Per Tube Daily  . feeding supplement (PRO-STAT SUGAR FREE 64)  60 mL Per Tube TID  . feeding supplement (VITAL 1.5 CAL)  1,000 mL Per Tube Q24H  . hydrALAZINE  50 mg Per Tube Q8H  . insulin aspart  0-20 Units Subcutaneous Q4H  . ipratropium-albuterol  3 mL Nebulization Q6H  . isosorbide mononitrate  120 mg Oral Daily  . methylPREDNISolone (SOLU-MEDROL) injection  40 mg Intravenous Q12H  . metoprolol tartrate  25 mg Per Tube BID  . pantoprazole sodium  40 mg Per Tube Q24H  . sodium chloride flush  10-40 mL Intracatheter Q12H   Dg Chest Port 1 View  06/12/2016  CLINICAL DATA:  ETT EXAM: PORTABLE CHEST 1 VIEW COMPARISON:  May 12, 2016 FINDINGS: Stable support apparatus including the ETT. Increased interstitial markings in the right greater than left lung may be at least partially due to positioning. I suspect mild edema. There is a mildly more focal opacity in the left upper lung. IMPRESSION: 1. Stable support apparatus. 2. Probable mild edema. 3. Focal opacity in the left mid  lung. Recommend attention on follow-up. Electronically Signed   By: Dorise Bullion III M.D   On: Jun 12, 2016 08:17   Dg Chest Port 1 View  05/12/2016  CLINICAL DATA:  Hypoxia EXAM: PORTABLE CHEST 1 VIEW COMPARISON:  May 11, 2016 FINDINGS: Endotracheal tube tip is 3.7 cm above the carina. Central catheter tips are in the superior vena cava. Nasogastric tube tip and side port are below the diaphragm. No pneumothorax. There is patchy airspace consolidation in both upper lobes with mild interstitial edema throughout both lungs. There is atelectatic change in the left base. Heart is mildly enlarged with pulmonary vascularity within normal limits. No adenopathy. IMPRESSION: Tube and catheter positions as described without pneumothorax. Interstitial edema with patchy airspace opacity in the upper lobes, consistent with either alveolar edema or pneumonia. Both entities may exist concurrently. Probable atelectasis left base. Stable cardiac silhouette. Electronically Signed   By: Lowella Grip III M.D.   On: 05/12/2016 07:05   BMET    Component Value Date/Time   NA 136 12-Jun-2016 0400   K 4.5 06-12-16 0400   CL 97* 12-Jun-2016 0400   CO2 24 June 12, 2016 0400   GLUCOSE 201* 06/12/16 0400   BUN 123* 06-12-16 0400  CREATININE 2.80* 05/18/16 0400   CREATININE 1.98* 01/05/2016 0923   CALCIUM 7.8* 05-18-16 0400   GFRNONAA 16* 05-18-2016 0400   GFRAA 18* 05-18-16 0400   CBC    Component Value Date/Time   WBC 11.9* May 18, 2016 0400   RBC 2.47* 2016/05/18 0400   HGB 7.9* May 18, 2016 0400   HCT 24.5* May 18, 2016 0400   PLT 41* 2016-05-18 0400   MCV 99.2 05-18-2016 0400   MCH 32.0 05/18/16 0400   MCHC 32.2 05/18/2016 0400   RDW 15.8* 05/18/16 0400   LYMPHSABS 0.4* 05/11/2016 0432   MONOABS 0.4 05/11/2016 0432   EOSABS 0.0 05/11/2016 0432   BASOSABS 0.0 05/11/2016 0432     Assessment: 1.  Acute on CKD 3 most likely hemodynamically mediated due to low BP post complication from line  placement. 2. CKD 3 sec HTN 3. COPD 4. ? PNA 5. NSTEMI 6. Mild hyperkalemia, improved 7. Thrombocytopenia 8. Obtundation  ? Related to sedative meds and uremia 9. Pulm edema improved Plan: 1.  Will plan HD again today  2. Recheck labs in AM   Antawn Sison T

## 2016-06-03 NOTE — Progress Notes (Signed)
   05-22-16 1721  Clinical Encounter Type  Visited With Patient and family together  Visit Type Spiritual support  Referral From Nurse  Spiritual Encounters  Spiritual Needs Prayer;Grief support  Stress Factors  Family Stress Factors Loss  Chaplain responded to page, patient expired daughter at bedside, emotional & grief support provided, sat with daughter until additional family arrived. Support and prayer also provided to staff.

## 2016-06-03 NOTE — Progress Notes (Signed)
PHARMACY - PHYSICIAN COMMUNICATION CRITICAL VALUE ALERT - BLOOD CULTURE IDENTIFICATION (BCID)  Results for orders placed or performed during the hospital encounter of 04/03/2016  Blood Culture ID Panel (Reflexed) (Collected: 05/12/2016 10:20 AM)  Result Value Ref Range   Enterococcus species NOT DETECTED NOT DETECTED   Vancomycin resistance NOT DETECTED NOT DETECTED   Listeria monocytogenes NOT DETECTED NOT DETECTED   Staphylococcus species DETECTED (A) NOT DETECTED   Staphylococcus aureus NOT DETECTED NOT DETECTED   Methicillin resistance DETECTED (A) NOT DETECTED   Streptococcus species NOT DETECTED NOT DETECTED   Streptococcus agalactiae NOT DETECTED NOT DETECTED   Streptococcus pneumoniae NOT DETECTED NOT DETECTED   Streptococcus pyogenes NOT DETECTED NOT DETECTED   Acinetobacter baumannii NOT DETECTED NOT DETECTED   Enterobacteriaceae species NOT DETECTED NOT DETECTED   Enterobacter cloacae complex NOT DETECTED NOT DETECTED   Escherichia coli NOT DETECTED NOT DETECTED   Klebsiella oxytoca NOT DETECTED NOT DETECTED   Klebsiella pneumoniae NOT DETECTED NOT DETECTED   Proteus species NOT DETECTED NOT DETECTED   Serratia marcescens NOT DETECTED NOT DETECTED   Carbapenem resistance NOT DETECTED NOT DETECTED   Haemophilus influenzae NOT DETECTED NOT DETECTED   Neisseria meningitidis NOT DETECTED NOT DETECTED   Pseudomonas aeruginosa NOT DETECTED NOT DETECTED   Candida albicans NOT DETECTED NOT DETECTED   Candida glabrata NOT DETECTED NOT DETECTED   Candida krusei NOT DETECTED NOT DETECTED   Candida parapsilosis NOT DETECTED NOT DETECTED   Candida tropicalis NOT DETECTED NOT DETECTED    Name of physician (or Provider) Contacted: Yacoub  Changes to prescribed antibiotics required: Restarting vancomycin  Gibson Lad, Drake Leach 05/11/2016  12:56 PM

## 2016-06-03 NOTE — Progress Notes (Signed)
   Now in AF rates 90-100.  Volume status managed with HD.   Nothing to add today.   Cardiology will follow at a distance.   Bensimhon, Daniel,MD 10:15 AM

## 2016-06-03 DEATH — deceased

## 2016-07-04 NOTE — Discharge Summary (Signed)
NAMECALISTA, [name] NO.:  [id number]  MEDICAL RECORD NO.:  [id number]  LOCATION:  2H01C                        FACILITY:  [facility]  PHYSICIAN:  Felipa Evener, MD  DATE OF BIRTH:  1940-12-17  DATE OF ADMISSION:  04/12/2016 DATE OF DISCHARGE:  20-May-2016                              DISCHARGE SUMMARY   PRIMARY DIAGNOSIS/CAUSE OF DEATH:  Refractory septic shock.  SECONDARY DIAGNOSES:  Acute hypercarbic and hypoxic respiratory failure, COPD, H cap, VCD with a shunt, non STEMI, acute on chronic diastolic congestive heart failure, acute on chronic renal failure, anemia, hyperglycemia and acute encephalopathy.  HOSPITAL COURSE:  The patient was a 75 year old female with extensive past medical history including heart failure, renal failure, and COPD, presented to the hospital with shortness of breath, found to have a non STEMI with a congestive heart failure and pneumonia, quickly developed septic and cardiogenic shock.  The patient failed to make any significant improvement on her multiple days in intensive care unit.  I had a conversation with family who after the conversation decided the patient would not want CPR or electric shocks.  Overnight on 05-20-2016, the patient's blood pressure continued to deteriorate, Levophed was started.  The patient failed to make any improvement and suffered asystolic cardiac arrest.  The patient was declared dead.  The family was notified.     Felipa Evener, MD     WJY/MEDQ  D:  06/07/2016  T:  06/08/2016  Job:  [id number]

## 2016-07-20 ENCOUNTER — Ambulatory Visit: Payer: Medicare Other | Admitting: Cardiology

## 2018-05-24 IMAGING — CR DG CHEST 1V PORT
1 series · 1 of 1 positions shown · non-contrast
Comparison: 04/26/2016

CLINICAL DATA: Acute onset shortness of breath today. COPD with
acute exacerbation. Myocardial infarct. Stage 4 chronic kidney
disease.

EXAM:
PORTABLE CHEST 1 VIEW

[AP]
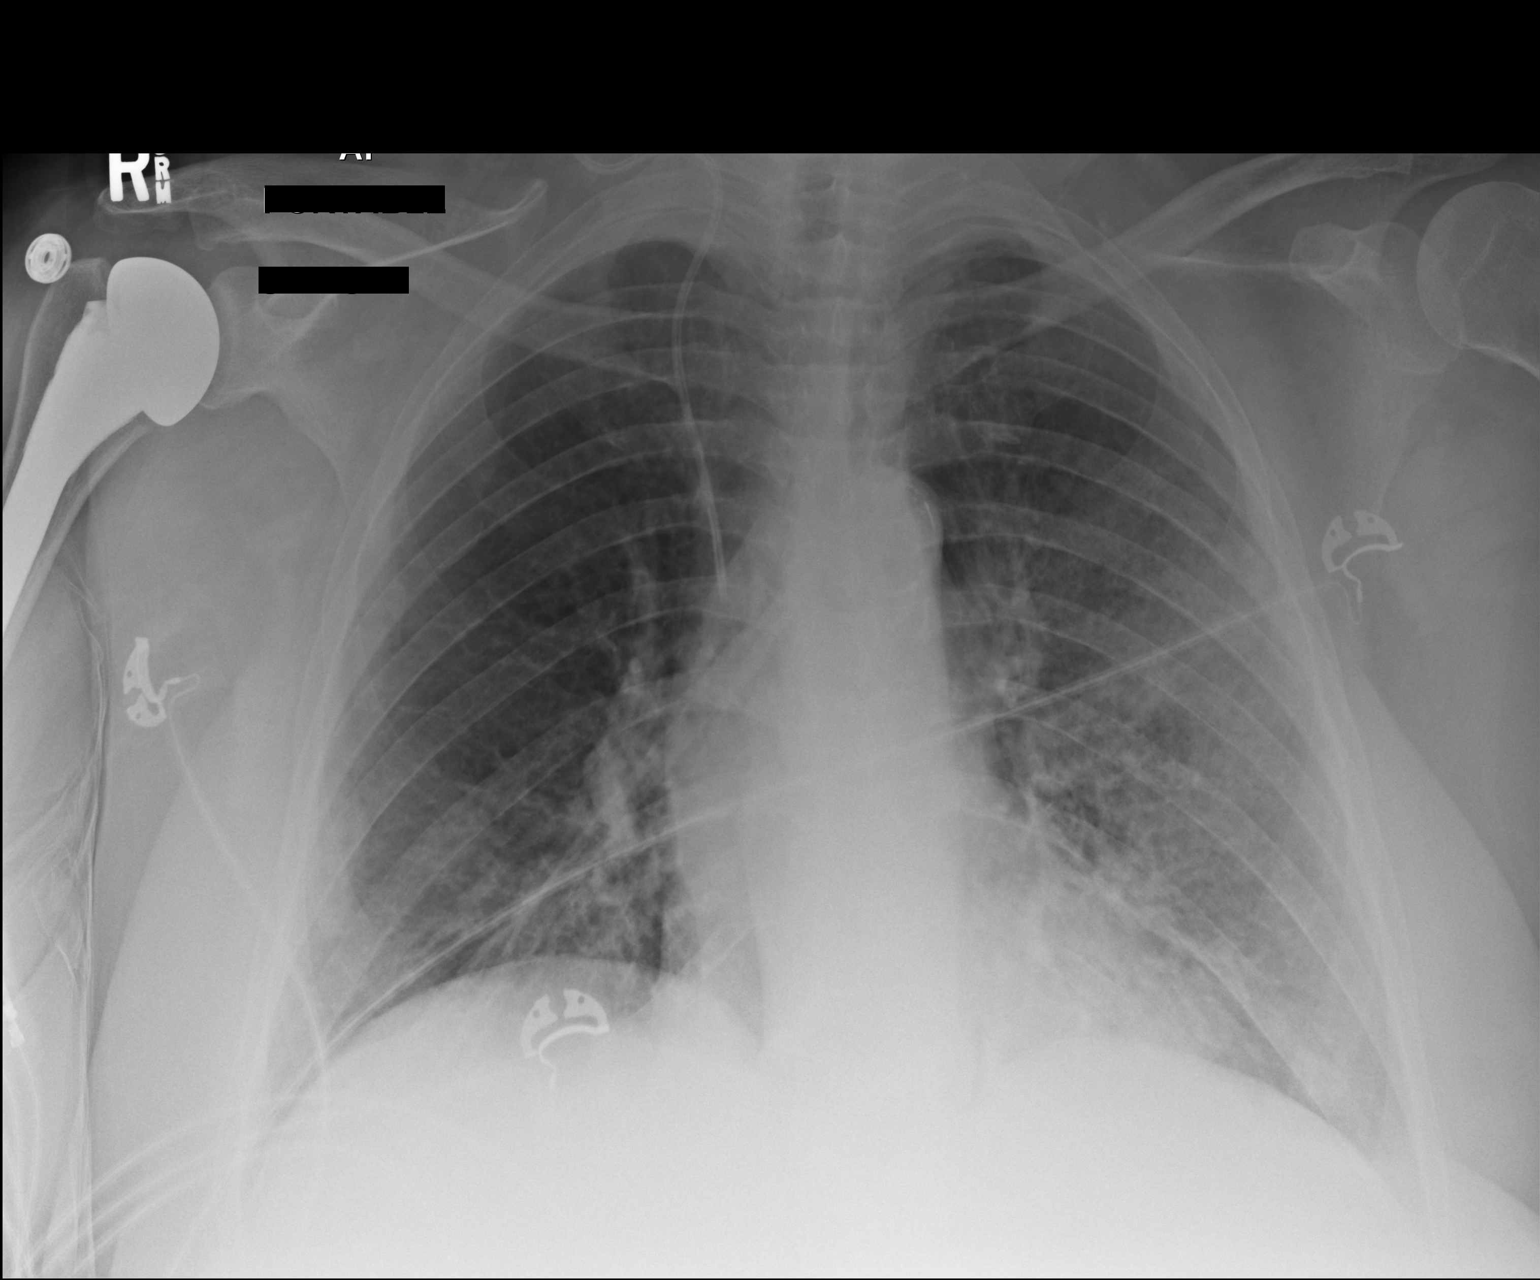

[1 of 1 positions shown; findings below may reference images not displayed]

FINDINGS: New asymmetric airspace disease is seen involving the left mid and
lower lung, and the right lung base to lesser extent. This may be
due to asymmetric edema or infection. No definite evidence pleural
effusion. Heart size remains normal. Right jugular central venous
catheter remains in appropriate position. No pneumothorax
visualized.
IMPRESSION: New asymmetric airspace disease involving left lung greater than
right. Differential diagnosis includes asymmetric edema and
infection.

## 2018-06-02 IMAGING — CR DG CHEST 1V PORT
1 series · 1 of 1 positions shown · non-contrast
Comparison: 05/10/2016

CLINICAL DATA: Congestive heart failure

EXAM:
PORTABLE CHEST 1 VIEW

[AP]
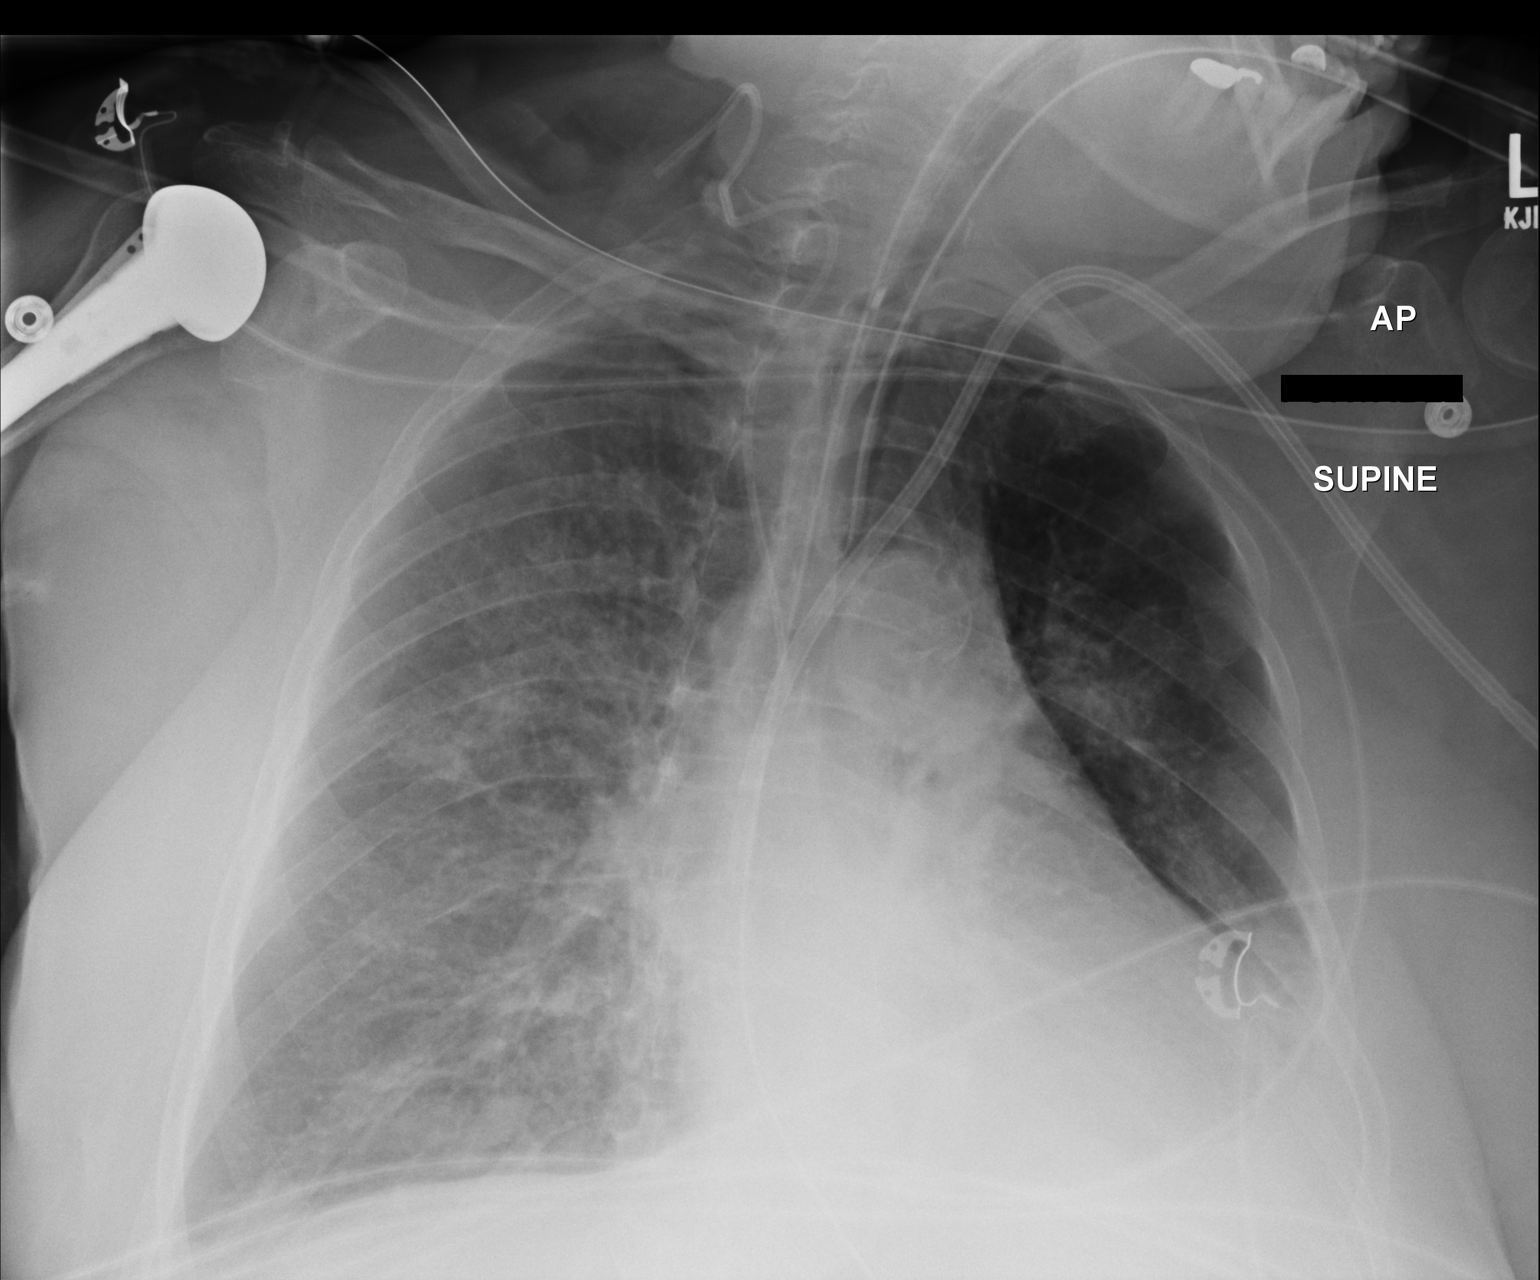

[1 of 1 positions shown; findings below may reference images not displayed]

FINDINGS: Endotracheal tube tip about 3.7 cm above the carina. Orogastric tube
crosses the gastroesophageal junction. Stable right IJ central line.

Persistent cardiac enlargement with hazy perihilar attenuation as
well as left lower lobe opacity. Overall appearance very similar to
prior study with mildly improved aeration at both lung bases.
IMPRESSION: Pulmonary edema with asymmetric opacity left lung base suggesting
consolidation related to asymmetric edema atelectasis or
pneumonitis. Mild improvement in bi basilar aeration.
